# Patient Record
Sex: Male | Born: 1950 | Race: White | Hispanic: No | Marital: Married | Smoking: Never smoker
Health system: Southern US, Community
[De-identification: ages and names within clinical notes are randomized; demographics above are authoritative.]

## PROBLEM LIST (undated history)

## (undated) DIAGNOSIS — I351 Nonrheumatic aortic (valve) insufficiency: Secondary | ICD-10-CM

## (undated) DIAGNOSIS — J189 Pneumonia, unspecified organism: Secondary | ICD-10-CM

## (undated) DIAGNOSIS — I499 Cardiac arrhythmia, unspecified: Secondary | ICD-10-CM

## (undated) DIAGNOSIS — I77810 Thoracic aortic ectasia: Secondary | ICD-10-CM

## (undated) DIAGNOSIS — I4891 Unspecified atrial fibrillation: Secondary | ICD-10-CM

## (undated) DIAGNOSIS — E785 Hyperlipidemia, unspecified: Secondary | ICD-10-CM

## (undated) HISTORY — DX: Thoracic aortic ectasia: I77.810

## (undated) HISTORY — PX: HERNIA REPAIR: SHX51

## (undated) HISTORY — DX: Unspecified atrial fibrillation: I48.91

## (undated) HISTORY — PX: COLONOSCOPY: SHX174

## (undated) HISTORY — DX: Hyperlipidemia, unspecified: E78.5

## (undated) HISTORY — PX: AORTIC VALVE REPLACEMENT: SHX41

## (undated) HISTORY — DX: Nonrheumatic aortic (valve) insufficiency: I35.1

---

## 1998-09-04 ENCOUNTER — Encounter (HOSPITAL_BASED_OUTPATIENT_CLINIC_OR_DEPARTMENT_OTHER): Payer: Self-pay | Admitting: General Surgery

## 1998-09-06 ENCOUNTER — Ambulatory Visit (HOSPITAL_COMMUNITY): Admission: RE | Admit: 1998-09-06 | Discharge: 1998-09-06 | Payer: Self-pay | Admitting: General Surgery

## 2000-09-27 ENCOUNTER — Emergency Department (HOSPITAL_COMMUNITY): Admission: EM | Admit: 2000-09-27 | Discharge: 2000-09-27 | Payer: Self-pay | Admitting: Occupational Medicine

## 2004-12-07 ENCOUNTER — Encounter (INDEPENDENT_AMBULATORY_CARE_PROVIDER_SITE_OTHER): Payer: Self-pay | Admitting: Specialist

## 2004-12-07 ENCOUNTER — Ambulatory Visit (HOSPITAL_COMMUNITY): Admission: RE | Admit: 2004-12-07 | Discharge: 2004-12-07 | Payer: Self-pay | Admitting: Gastroenterology

## 2005-08-27 ENCOUNTER — Encounter: Admission: RE | Admit: 2005-08-27 | Discharge: 2005-08-27 | Payer: Self-pay | Admitting: Internal Medicine

## 2008-09-30 ENCOUNTER — Encounter: Admission: RE | Admit: 2008-09-30 | Discharge: 2008-09-30 | Payer: Self-pay | Admitting: Internal Medicine

## 2009-08-21 ENCOUNTER — Encounter: Admission: RE | Admit: 2009-08-21 | Discharge: 2009-08-21 | Payer: Self-pay | Admitting: Internal Medicine

## 2011-04-05 NOTE — Op Note (Signed)
NAMEMARKELLE, [name]:  [id number]   MEDICAL RECORD NO.:  [id number]          PATIENT TYPE:  AMB   LOCATION:  ENDO                         FACILITY:  [facility]   PHYSICIAN:  Anselmo Rod, M.D.  DATE OF BIRTH:  06-07-1951   DATE OF PROCEDURE:  12/07/2004  DATE OF DISCHARGE:                                 OPERATIVE REPORT   PROCEDURE PERFORMED:  Colonoscopy with snare polypectomy x1.   ENDOSCOPIST:  Anselmo Rod, M.D.   INSTRUMENT USED:  Olympus videocolonoscope.   INDICATION FOR THE PROCEDURE:  A 60 year old white male with a family  history of colon cancer, underwent a screening colonoscopy to rule out  colonic polyps, masses, etc.   PREPROCEDURE PREPARATION:  Informed consent was procured from the patient  who was fasted for eight hours prior to the procedure and prepped with a  bottle of magnesium citrate and a gallon of GoLYTELY the night prior to the  procedure.   PREPROCEDURE PHYSICAL:  VITAL SIGNS:  Stable.  NECK:  Supple.  CHEST:  Clear to auscultation.  S1 and S2 regular.  ABDOMEN:  Soft with normal bowel sounds.   DESCRIPTION OF THE PROCEDURE:  The patient was placed in the left lateral  decubitus position and sedated with 50 mg of Demerol and 5 mg of Versed in  slow incremental doses.  Once the patient was adequately sedated and  maintained on low-flow oxygen and continuous cardiac monitoring, the Olympus  videocolonoscope was advanced from the rectum to the cecum, and the patient  had a fairly good prep.  The appendicular orifice and the ileocecal valve  were clearly visualized and photographed.  A small pedunculated polyp was  snared from the hepatic flexure.  Small internal hemorrhoids were seen on  retroflexion.  The rest of the exam was unremarkable.   IMPRESSION:  1.  Small pedunculated polyp snared from the hepatic flexure by snare      polypectomy.  2.  Small internal hemorrhoids seen on retroflexion.  3.  No other masses,  polyps, or diverticula identified.   RECOMMENDATIONS:  1.  Await pathology results.  2.  Avoid nonsteroidals including aspirin for the next four weeks.  3.  Repeat colonoscopy depending on pathology results.  4.  Outpatient followup as need arises in the future.      Jyot   JNM/MEDQ  D:  12/07/2004  T:  12/07/2004  Job:  [id number]   cc:   Olene Craven, M.D.  [address]  [address]  [place]  [place] [address]  Fax: [phone]

## 2015-09-05 DIAGNOSIS — I712 Thoracic aortic aneurysm, without rupture: Secondary | ICD-10-CM

## 2015-09-05 DIAGNOSIS — I7121 Aneurysm of the ascending aorta, without rupture: Secondary | ICD-10-CM

## 2015-09-06 ENCOUNTER — Other Ambulatory Visit: Payer: Self-pay | Admitting: Cardiology

## 2015-09-06 DIAGNOSIS — I712 Thoracic aortic aneurysm, without rupture: Secondary | ICD-10-CM

## 2015-09-06 DIAGNOSIS — I7121 Aneurysm of the ascending aorta, without rupture: Secondary | ICD-10-CM

## 2015-09-11 NOTE — H&P (Signed)
OFFICE VISIT NOTES COPIED TO EPIC FOR DOCUMENTATION   Chad Lawrence 08/28/2015 9:22 AM Location: [facility] Patient #: [phone] DOB: 06-24-1951 Married / Language: Chad Lawrence / Race: White Male   History of Present Illness Chad Lawrence AGNP-C; 08/28/2015 2:55 PM) The patient is a 64 year old male who presents with atrial fibrillation. Patient with no significant prior cardiovascular history, presented on 08/16/2015 with sudden onset of dizziness on waking up. There is no recurrence of symptoms since then. Acute office visit with his PCP revealed presence of atrial fibrillation with controlled ventricular response. He is now referred to me for further evaluation. His last EKG on 10/12/2014 revealed normal sinus rhythm. No other complaints, he is presently asymptomatic.  He denies any chest pain, PND, orthopnea, or syncope. He denies any symptoms suggestive of TIA or claudication. He is a nonsmoker. Denies any history of hypertension, diabetes, hyperlipidemia, or thyroid disease. He does report recent onset of daytime somnolence starting Inderal as well as episodes of wheezing at night which he has attributed to seasonal allergies. His wife reports that he snores at night with witnessed apneic episodes.   Problem List/Past Medical Chad Lawrence; 08/28/2015 9:30 AM) Afib (I48.91)  Allergies Chad Lawrence; 08/28/2015 9:30 AM) No Known Drug Allergies10/08/2015  Family History Chad Lawrence; 08/28/2015 2:02 PM) Mother In stable health. 65 yrs old; HTN, no other cardiovascular conditions; no heart attack or stroke; Father Deceased. at 57 yrs emphysema  Social History Chad Lawrence; 08/28/2015 9:32 AM) Current tobacco use Never smoker. Marital status Married. Living Situation Lives with spouse. Number of Children 5.  Past Surgical History Chad Lawrence; 08/28/2015 2:23 PM) Hernia Chad Lawrence  Medication History Chad Lawrence; 08/28/2015  2:25 PM) Aspirin (81MG  Tablet, 1 Oral daily) Active. Eliquis (5MG  Tablet, 1 Oral two times daily) Active. Inderal XL (80MG  Capsule ER 24HR, 1 Oral daily) Active.  Diagnostic Studies History Chad Lawrence; 08/28/2015 9:32 AM) Colonoscopy2013    Review of Systems (Chad Lawrence AGNP-C; 08/28/2015 2:54 PM) General Present- Fatigue. Not Present- Anorexia and Fever. Respiratory Present- Snoring and Wheezing. Not Present- Cough, Decreased Exercise Tolerance and Dyspnea. Cardiovascular Not Present- Chest Pain, Claudications, Edema, Orthopnea, Paroxysmal Nocturnal Dyspnea and Shortness of Breath. Gastrointestinal Not Present- Change in Bowel Habits, Constipation and Nausea. Neurological Present- Dizziness. Not Present- Focal Neurological Symptoms. Endocrine Not Present- Appetite Changes, Cold Intolerance and Heat Intolerance. Hematology Not Present- Anemia, Petechiae and Prolonged Bleeding.  Vitals Chad Lawrence; 08/28/2015 2:04 PM) 08/28/2015 1:59 PM Weight: 185.56 lb Height: 72in Body Surface Area: 2.06 m Body Mass Index: 25.17 kg/m  Pulse: 84 (Irregular)  P.OX: 98% (Room air) BP: 110/62 (Sitting, Left Arm, Standard)       Physical Exam (Chad Lawrence AGNP-C; 08/28/2015 2:53 PM) General Mental Status-Alert. General Appearance-Cooperative, Appears stated age, Not in acute distress. Orientation-Oriented X3. Build & Nutrition-Well nourished and Moderately built.  Head and Neck Thyroid Gland Characteristics - no palpable nodules, no palpable enlargement.  Chest and Lung Exam Palpation Tender - No chest wall tenderness. Auscultation Breath sounds - Clear.  Cardiovascular Inspection Jugular vein - Right - No Distention. Auscultation Heart Sounds - S1 WNL, S2 WNL and No gallop present. Murmurs & Other Heart Sounds: Murmur - Location - Aortic Area. Grade - I/VI.  Abdomen Palpation/Percussion Palpation and Percussion of the abdomen reveal -  Non Tender and No hepatosplenomegaly. Auscultation Auscultation of the abdomen reveals - Bowel sounds normal.  Peripheral Vascular Lower Extremity Inspection - Left - No Pigmentation, No Varicose veins. Right - No Pigmentation,  No Varicose veins. Palpation - Edema - Left - No edema. Right - No edema. Femoral pulse - Left - Normal. Right - Normal. Popliteal pulse - Left - Normal. Right - Normal. Dorsalis pedis pulse - Left - Normal. Right - Normal. Posterior tibial pulse - Left - Normal. Right - Normal. Carotid arteries - Left-No Carotid bruit. Carotid arteries - Right-No Carotid bruit. Abdomen-No prominent abdominal aortic pulsation, No epigastric bruit.  Neurologic Motor-Grossly intact without any focal deficits.  Musculoskeletal Global Assessment Left Lower Extremity - normal range of motion without pain. Right Lower Extremity - normal range of motion without pain.    Assessment & Plan (Chad Lawrence AGNP-C; 08/28/2015 2:53 PM) New onset atrial fibrillation (I48.91) Story: Labs 08/16/2015: Labs 08/16/2015: Creatinine 0.90, potassium 4.1, CMP normal, CBC normal, TSH 2.355 Impression: EKG 08/28/2015: Atrial fibrillation with controlled ventricular response at a rate of 88 bpm, left axis deviation, left anterior fascicular block, poor R-wave progression, cannot exclude anterior infarct old. Current Plans Discontinued Inderal XL 80MG  (SOB). Started Metoprolol Succinate ER 25MG , 1 (one) Tablet daily, #30, 08/28/2015, Ref. x3. Started Eliquis 5MG , 1 Tablet two times daily, #60, 30 days starting 08/28/2015, Ref. x5. Complete electrocardiogram (93000) Future Plans 09/05/2015: Echocardiography, transthoracic, real-time with image documentation (2D), includes M-mode recording, when performed, complete, with spectral Doppler echocardiography, and with color flow Doppler echocardiography (95621) - one time 09/08/2015: Myocardial perfusion imaging, tomographic (SPECT) (including  attenuation correction, qualitative or quantitative wall motion, ejection fraction by first pass or gated technique, additional quantification, when performed) - one time Daytime somnolence (R40.0) Snoring (R06.83) Current Plans Mechanism of underlying disease process and action of medications discussed with the patient. I discussed primary/secondary prevention. He presents for evaluation of recently diagnosed atrial fibrillation. He presented to his PCP with complaints of dizziness and near syncope and A. fib was noted on EKG. He denies any palpitations, chest pain, or shortness of breath. He was started on Inderal and Eliquis by his PCP and referred here for further evaluation. He remains in atrial fibrillation today. He reports fatigue, shortness of breath, and sensation of wheezing at night since starting Inderal. I've discontinued this and changed to metoprolol for cardioselectivity. Schedule for echocardiogram to evaluate for structural or valvular abnormalities and exercise Myoview to evaluate for myocardial ischemia as etiology of atrial fibrillation. Suspect sleep apnea to be etiology of A. fib. Wife present at bedside and reports that the patient snores and that she has witnessed apneic episodes at night. Will refer to Dr. Brett Fairy for further evaluation. He also reports taking Claritin-D daily for the past several weeks due to allergies. He was advised to avoid pseudophedrine containing decongestants. Follow up after testing at which time we will discuss cardioversion if he remains in A. fib.  *I have discussed this case with Dr. Einar Gip and he personally examined the patient and participated in formulating the plan.*    Signed by Chad Lawrence, AGNP-C (08/28/2015 2:56 PM)  Addnedum: Outpatient echo revealing severe ascending aortic aneurysm measuring 6cm. Hence, stress cancelled and patient set up for left heart cath in preparation for aortic root repair.  Echocardiogram 09/05/2015: Left  ventricle cavity is normal in size. Mild to moderate concentric hypertrophy of the left ventricle. Normal global wall motion. Calculated EF 65%. Aortic valve is tricuspid. Moderate to severe aortic regurgitation. 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 Severely dilated ascending aorta.  Carotid artery duplex 09/07/2015: No hemodynamically significant arterial disease in the internal carotid artery bilaterally.  Patient has also  been set up for CT angio chest and abdomen also for sizing.  .I have personally reviewed the patient's record and performed physical exam and agree with the assessment and plan of Ms. Chad Labella, NP-C.  Adrian Prows, MD 09/11/2015, 6:43 PM [facility]. [place] Pager: [phone] Office: [phone] If no answer: Cell:  [phone]

## 2015-09-12 ENCOUNTER — Encounter (HOSPITAL_COMMUNITY)
Admission: RE | Disposition: A | Discharge status: Home / Self Care | Payer: Self-pay | Source: Ambulatory Visit | Attending: Cardiology

## 2015-09-12 ENCOUNTER — Ambulatory Visit (HOSPITAL_COMMUNITY)
Admission: RE | Admit: 2015-09-12 | Discharge: 2015-09-12 | Disposition: A | Discharge status: Home / Self Care | Payer: 59 | Source: Ambulatory Visit | Attending: Cardiology | Admitting: Cardiology

## 2015-09-12 DIAGNOSIS — I712 Thoracic aortic aneurysm, without rupture: Secondary | ICD-10-CM | POA: Diagnosis present

## 2015-09-12 DIAGNOSIS — I7121 Aneurysm of the ascending aorta, without rupture: Secondary | ICD-10-CM

## 2015-09-12 DIAGNOSIS — I4891 Unspecified atrial fibrillation: Secondary | ICD-10-CM | POA: Insufficient documentation

## 2015-09-12 DIAGNOSIS — I2510 Atherosclerotic heart disease of native coronary artery without angina pectoris: Secondary | ICD-10-CM | POA: Diagnosis not present

## 2015-09-12 HISTORY — PX: CARDIAC CATHETERIZATION: SHX172

## 2015-09-12 SURGERY — LEFT HEART CATH AND CORONARY ANGIOGRAPHY
Anesthesia: LOCAL

## 2015-09-12 MED ORDER — HYDROMORPHONE HCL 1 MG/ML IJ SOLN
INTRAMUSCULAR | Status: AC
  Filled 2015-09-12: qty 1

## 2015-09-12 MED ORDER — SODIUM CHLORIDE 0.9 % IV SOLN: 250.0000 mL | INTRAVENOUS | Status: DC | PRN

## 2015-09-12 MED ORDER — SODIUM CHLORIDE 0.9 % IV SOLN
INTRAVENOUS | Status: DC
  Administered 2015-09-12: 10:00:00 via INTRAVENOUS

## 2015-09-12 MED ORDER — ASPIRIN 81 MG PO CHEW: 81.0000 mg | CHEWABLE_TABLET | ORAL | Status: DC

## 2015-09-12 MED ORDER — NITROGLYCERIN 1 MG/10 ML FOR IR/CATH LAB
INTRA_ARTERIAL | Status: AC
  Filled 2015-09-12: qty 10

## 2015-09-12 MED ORDER — LIDOCAINE HCL (PF) 1 % IJ SOLN
INTRAMUSCULAR | Status: AC
  Filled 2015-09-12: qty 30

## 2015-09-12 MED ORDER — HYDROMORPHONE HCL 1 MG/ML IJ SOLN
INTRAMUSCULAR | Status: DC | PRN
  Administered 2015-09-12: 0.5 mg via INTRAVENOUS

## 2015-09-12 MED ORDER — HEPARIN SODIUM (PORCINE) 1000 UNIT/ML IJ SOLN
INTRAMUSCULAR | Status: AC
  Filled 2015-09-12: qty 1

## 2015-09-12 MED ORDER — MIDAZOLAM HCL 2 MG/2ML IJ SOLN
INTRAMUSCULAR | Status: AC
  Filled 2015-09-12: qty 4

## 2015-09-12 MED ORDER — IOHEXOL 350 MG/ML SOLN
INTRAVENOUS | Status: DC | PRN
  Administered 2015-09-12: 50 mL via INTRA_ARTERIAL

## 2015-09-12 MED ORDER — HEPARIN (PORCINE) IN NACL 2-0.9 UNIT/ML-% IJ SOLN
INTRAMUSCULAR | Status: AC
  Filled 2015-09-12: qty 1000

## 2015-09-12 MED ORDER — HEPARIN SODIUM (PORCINE) 1000 UNIT/ML IJ SOLN
INTRAMUSCULAR | Status: DC | PRN
  Administered 2015-09-12: 2500 [IU] via INTRAVENOUS

## 2015-09-12 MED ORDER — VERAPAMIL HCL 2.5 MG/ML IV SOLN
INTRA_ARTERIAL | Status: DC | PRN
  Administered 2015-09-12: 7 mL via INTRA_ARTERIAL

## 2015-09-12 MED ORDER — SODIUM CHLORIDE 0.9 % IJ SOLN: 3.0000 mL | Freq: Two times a day (BID) | INTRAMUSCULAR | Status: DC

## 2015-09-12 MED ORDER — MIDAZOLAM HCL 2 MG/2ML IJ SOLN
INTRAMUSCULAR | Status: DC | PRN
  Administered 2015-09-12: 2 mg via INTRAVENOUS

## 2015-09-12 MED ORDER — LIDOCAINE HCL (PF) 1 % IJ SOLN
INTRAMUSCULAR | Status: DC | PRN
  Administered 2015-09-12: 5 mL

## 2015-09-12 MED ORDER — SODIUM CHLORIDE 0.9 % IJ SOLN: 3.0000 mL | INTRAMUSCULAR | Status: DC | PRN

## 2015-09-12 MED ORDER — SODIUM CHLORIDE 0.9 % WEIGHT BASED INFUSION: 3.0000 mL/kg/h | INTRAVENOUS | Status: DC

## 2015-09-12 SURGICAL SUPPLY — 10 items
CATH INFINITI 5 FR AL2 (CATHETERS) ×2 IMPLANT
CATH OPTITORQUE TIG 4.5 5F (CATHETERS) ×2 IMPLANT
DEVICE RAD COMP TR BAND LRG (VASCULAR PRODUCTS) ×2 IMPLANT
GLIDESHEATH SLEND A-KIT 6F 20G (SHEATH) ×2 IMPLANT
GUIDEWIRE ANGLED .035X150CM (WIRE) ×2 IMPLANT
KIT HEART LEFT (KITS) ×2 IMPLANT
PACK CARDIAC CATHETERIZATION (CUSTOM PROCEDURE TRAY) ×2 IMPLANT
TRANSDUCER W/STOPCOCK (MISCELLANEOUS) ×2 IMPLANT
TUBING CIL FLEX 10 FLL-RA (TUBING) ×2 IMPLANT
WIRE SAFE-T 1.5MM-J .035X260CM (WIRE) ×2 IMPLANT

## 2015-09-12 NOTE — Interval H&P Note (Signed)
History and Physical Interval Note:  09/12/2015 10:50 AM  Chad Lawrence  has presented today for surgery, with the diagnosis of cp    a- fib  The various methods of treatment have been discussed with the patient and family. After consideration of risks, benefits and other options for treatment, the patient has consented to  Procedure(s): Left Heart Cath and Coronary Angiography (N/A) as a surgical intervention .  The patient's history has been reviewed, patient examined, no change in status, stable for surgery.  I have reviewed the patient's chart and labs.  Questions were answered to the patient's satisfaction.     Adrian Prows

## 2015-09-12 NOTE — Discharge Instructions (Signed)
Radial Site Care °Refer to this sheet in the next few weeks. These instructions provide you with information about caring for yourself after your procedure. Your health care provider may also give you more specific instructions. Your treatment has been planned according to current medical practices, but problems sometimes occur. Call your health care provider if you have any problems or questions after your procedure. °WHAT TO EXPECT AFTER THE PROCEDURE °After your procedure, it is typical to have the following: °· Bruising at the radial site that usually fades within 1-2 weeks. °· Blood collecting in the tissue (hematoma) that may be painful to the touch. It should usually decrease in size and tenderness within 1-2 weeks. °HOME CARE INSTRUCTIONS °· Take medicines only as directed by your health care provider. °· You may shower 24-48 hours after the procedure or as directed by your health care provider. Remove the bandage (dressing) and gently wash the site with plain soap and water. Pat the area dry with a clean towel. Do not rub the site, because this may cause bleeding. °· Do not take baths, swim, or use a hot tub until your health care provider approves. °· Check your insertion site every day for redness, swelling, or drainage. °· Do not apply powder or lotion to the site. °· Do not flex or bend the affected arm for 24 hours or as directed by your health care provider. °· Do not push or pull heavy objects with the affected arm for 24 hours or as directed by your health care provider. °· Do not lift over 10 lb (4.5 kg) for 5 days after your procedure or as directed by your health care provider. °· Ask your health care provider when it is okay to: °¨ Return to work or school. °¨ Resume usual physical activities or sports. °¨ Resume sexual activity. °· Do not drive home if you are discharged the same day as the procedure. Have someone else drive you. °· You may drive 24 hours after the procedure unless otherwise  instructed by your health care provider. °· Do not operate machinery or power tools for 24 hours after the procedure. °· If your procedure was done as an outpatient procedure, which means that you went home the same day as your procedure, a responsible adult should be with you for the first 24 hours after you arrive home. °· Keep all follow-up visits as directed by your health care provider. This is important. °SEEK MEDICAL CARE IF: °· You have a fever. °· You have chills. °· You have increased bleeding from the radial site. Hold pressure on the site and call 911. °SEEK IMMEDIATE MEDICAL CARE IF: °· You have unusual pain at the radial site. °· You have redness, warmth, or swelling at the radial site. °· You have drainage (other than a small amount of blood on the dressing) from the radial site. °· The radial site is bleeding, and the bleeding does not stop after 30 minutes of holding steady pressure on the site. °· Your arm or hand becomes pale, cool, tingly, or numb. °  °This information is not intended to replace advice given to you by your health care provider. Make sure you discuss any questions you have with your health care provider. °  °Document Released: 12/07/2010 Document Revised: 11/25/2014 Document Reviewed: 05/23/2014 °Elsevier Interactive Patient Education ©2016 [employer] ° °

## 2015-09-13 ENCOUNTER — Encounter (HOSPITAL_COMMUNITY): Payer: Self-pay | Admitting: Cardiology

## 2015-09-14 ENCOUNTER — Ambulatory Visit
Admission: RE | Admit: 2015-09-14 | Discharge: 2015-09-14 | Disposition: A | Discharge status: Home / Self Care | Payer: 59 | Source: Ambulatory Visit | Attending: Cardiology | Admitting: Cardiology

## 2015-09-14 ENCOUNTER — Encounter (INDEPENDENT_AMBULATORY_CARE_PROVIDER_SITE_OTHER): Payer: Self-pay

## 2015-09-14 DIAGNOSIS — I7121 Aneurysm of the ascending aorta, without rupture: Secondary | ICD-10-CM

## 2015-09-14 DIAGNOSIS — I712 Thoracic aortic aneurysm, without rupture: Secondary | ICD-10-CM

## 2015-09-14 MED ORDER — IOPAMIDOL (ISOVUE-370) INJECTION 76%
75.0000 mL | Freq: Once | INTRAVENOUS | Status: AC | PRN
Start: 2015-09-14 — End: 2015-09-14
  Administered 2015-09-14: 75 mL via INTRAVENOUS

## 2015-09-15 ENCOUNTER — Encounter: Payer: Self-pay | Admitting: Cardiothoracic Surgery

## 2015-09-15 ENCOUNTER — Other Ambulatory Visit: Payer: Self-pay | Admitting: *Deleted

## 2015-09-15 ENCOUNTER — Institutional Professional Consult (permissible substitution) (INDEPENDENT_AMBULATORY_CARE_PROVIDER_SITE_OTHER): Payer: 59 | Admitting: Cardiothoracic Surgery

## 2015-09-15 VITALS — BP 123/72 | HR 87 | Resp 16 | Ht 72.0 in | Wt 180.0 lb

## 2015-09-15 DIAGNOSIS — I4819 Other persistent atrial fibrillation: Secondary | ICD-10-CM

## 2015-09-15 DIAGNOSIS — I351 Nonrheumatic aortic (valve) insufficiency: Secondary | ICD-10-CM

## 2015-09-15 DIAGNOSIS — I77810 Thoracic aortic ectasia: Secondary | ICD-10-CM | POA: Diagnosis not present

## 2015-09-15 DIAGNOSIS — I712 Thoracic aortic aneurysm, without rupture, unspecified: Secondary | ICD-10-CM

## 2015-09-15 DIAGNOSIS — I350 Nonrheumatic aortic (valve) stenosis: Secondary | ICD-10-CM

## 2015-09-15 DIAGNOSIS — I481 Persistent atrial fibrillation: Secondary | ICD-10-CM | POA: Diagnosis not present

## 2015-09-15 NOTE — Progress Notes (Signed)
[name] [address]             [phone]                    Chad Lawrence [place] Medical Record #[id number] Date of Birth: Mar 25, 1951  Referring: Adrian Prows, MD Primary Care: [name], MD  Chief Complaint:    Chief Complaint  Patient presents with  . Aortic Insuffiency    with AORTIC REGURGITATION, DIALATED AORTIC ROOT and AF...ECHO, 10/18, CATH 10/25, CTA'S 09/14/15    History of Present Illness:    Chad Lawrence 64 y.o. male is seen in the office  today for new diagnosis of AI, dilated aortic root and Afib of unknown duration. For several months especially during the summer patient had several episodes of light headed feeling but without syncope. Events noted with exertion, coaching baseball in heat, playing golf amd recently climbing stairs.  He has no previous cardiac history. He had a regular appointment with primary care and was found to be in afib. Eliquis was started  and patient referred to Dr Einar Gip.   Family history is significant that there is no family history of dilated ascending aorta or dissection, but father  died  70 with ruptured abdominal aneurysm. An uncle ( father brother) died of ?mi in his 29's without known details.  5 children, 3 step childrenand two adopted.    Current Activity/ Functional Status:  Patient is independent with mobility/ambulation, transfers, ADL's, IADL's.   Zubrod Score: At the time of surgery this patient's most appropriate activity status/level should be described as: [x]     0    Normal activity, no symptoms []     1    Restricted in physical strenuous activity but ambulatory, able to do out light work []     2    Ambulatory and capable of self care, unable to do work activities, up and about               >50 % of waking hours                              []     3    Only limited self care, in bed greater than 50% of waking hours []     4    Completely disabled, no self care,  confined to bed or chair []     5    Moribund   Past Medical History  Diagnosis Date  . Atrial fibrillation ([place])   . Aortic regurgitation   . Ascending aorta dilatation [facility])     Past Surgical History  Procedure Laterality Date  . Cardiac catheterization N/A 09/12/2015    Procedure: Left Heart Cath and Coronary Angiography;  Surgeon: Adrian Prows, MD;  Location: [facility];  Service: Cardiovascular;  Laterality: N/A;  . Hernia repair      Family History  Problem Relation Age of Onset  . Hypertension Mother   . Emphysema Father     Social History   Social History  . Marital Status: Married    Spouse Name: N/A  . Number of Children: 5  . Years of Education: N/A   Occupational History  . Not on file.   Social History Main Topics  . Smoking status: Never Smoker   . Smokeless tobacco: Never Used  . Alcohol Use: No  .  Drug Use: No  . Sexual Activity: Not on file          Social History Narrative  Works at [employer] for [employer], work involves traveling   History  Smoking status  . Never Smoker   Smokeless tobacco  . Never Used    History  Alcohol Use No     No Known Allergies  Current Outpatient Prescriptions  Medication Sig Dispense Refill  . aspirin EC 81 MG tablet Take 81 mg by mouth daily.    . clotrimazole-betamethasone (LOTRISONE) cream Apply 1 application topically daily as needed (rash).   1  . metoprolol succinate (TOPROL-XL) 25 MG 24 hr tablet Take 25 mg by mouth daily.  3  . apixaban (ELIQUIS) 5 MG TABS tablet Take 5 mg by mouth 2 (two) times daily.     No current facility-administered medications for this visit.      Review of Systems:     Cardiac Review of Systems: Y or N  Chest Pain [  n  ]  Resting SOB [n   ] Exertional SOB  [  n]  Orthopnea Florencio.Farrier  ]   Pedal Edema [   ]    Palpitations [ nn ] Syncope  [ n ]   Presyncope [ y  ]  General Review of Systems: [Y] = yes [  ]=no Constitional: recent weight change [ n ];  Wt  loss over the last 3 months [   ] anorexia [  ]; fatigue [  ]; nausea [  ]; night sweats [  ]; fever [  ]; or chills [  ];          Dental: poor dentition[n  ]; Last Dentist visit:regular visits   Eye : blurred vision [  ]; diplopia [   ]; vision changes [  ];  Amaurosis fugax[  ]; Resp: cough [  ];  wheezing[  ];  hemoptysis[  ]; shortness of breath[  ]; paroxysmal nocturnal dyspnea[  ]; dyspnea on exertion[  ]; or orthopnea[  ];  GI:  gallstones[  ], vomiting[  ];  dysphagia[  ]; melena[  ];  hematochezia [  ]; heartburn[  ];   Hx of  Colonoscopy[yes  5 years ago  ]; GU: kidney stones [  ]; hematuria[n  ];   dysuria [  ];  nocturia[  ];  history of     obstruction [  ]; urinary frequency [n  ]             Skin: rash, swelling[  ];, hair loss[  ];  peripheral edema[  ];  or itching[  ]; Musculosketetal: myalgias[  ];  joint swelling[  ];  joint erythema[  ];  joint pain[  ];  back pain[  ];  Heme/Lymph: bruising[  ];  bleeding[  ];  anemia[  ];  Neuro: TIA[  ];  headaches[  ];  stroke[  ];  vertigo[  ];  seizures[  ];   paresthesias[  ];  difficulty walking[n  ];  Psych:depression[  ]; anxiety[  ];  Endocrine: diabetes[n  ];  thyroid dysfunction[nn  ];  Immunizations: Flu up to date [  y]; Pneumococcal up to date [n  ];  Other:  Physical Exam: BP 123/72 mmHg  Pulse 87  Resp 16  Ht 6' (1.829 m)  Wt 180 lb (81.647 kg)  BMI 24.41 kg/m2  SpO2 98%  PHYSICAL EXAMINATION: General appearance: alert, cooperative, appears stated age, no  distress and no marfan features Head: Normocephalic, without obvious abnormality, atraumatic Neck: no adenopathy, no carotid bruit, no JVD, supple, symmetrical, trachea midline and thyroid not enlarged, symmetric, no tenderness/mass/nodules Lymph nodes: Cervical, supraclavicular, and axillary nodes normal. Resp: clear to auscultation bilaterally Back: symmetric, no curvature. ROM normal. No CVA tenderness. Cardio: irregularly irregular rhythm and diastolic  murmur: late diastolic 3/6, decrescendo at 2nd right intercostal space GI: soft, non-tender; bowel sounds normal; no masses,  no organomegaly Extremities: extremities normal, atraumatic, no cyanosis or edema Neurologic: Grossly normal  Diagnostic Studies & Laboratory data:     Recent Radiology Findings:  CATH: Dominance: Co-dominant   Left Anterior Descending   . Ost LAD to Prox LAD lesion, 0% stenosed. Proximal LAD diffuse ectasia/minimally aneurysmal     Left Circumflex   . Mid Cx to Dist Cx lesion, 0% stenosed. Mid segment Ectatic/mildly aneurysmal     Right Coronary Artery   . Mid RCA lesion, 30% stenosed. diffuse      no root or lv ejection, aorta appears dilated but con not bee evaluated by cath  I have independently reviewed the above  cath films and reviewed the findings with the  patient .    Ct Angio Chest Aorta W/cm &/or Wo/cm  09/14/2015  CLINICAL DATA:  Followup for thoracic aortic aneurysm. Patient recently had a heart catheterization. EXAM: CT ANGIOGRAPHY CHEST WITH CONTRAST TECHNIQUE: Multidetector CT imaging of the chest was performed using the standard protocol during bolus administration of intravenous contrast. Multiplanar CT image reconstructions and MIPs were obtained to evaluate the vascular anatomy. CONTRAST:  75 mL of Isovue 370 intravenous contrast COMPARISON:  Chest radiograph, 08/27/2005 FINDINGS: Angiographic study: The ascending aorta is dilated. The was measured just above the origin of the left coronary artery right measures 6.6 x 6.3 cm. Aorta is dilated beginning just distal to the aortic valve. Becomes normal in caliber as it extends into the aortic arch. Descending thoracic aorta is normal in caliber. There is no dissection. There is no significant plaque. Aortic arch branch vessels are widely patent. The visualized upper abdominal aorta shows mild partly calcified plaque. The branch vessels are widely patent. Thoracic inlet: Heterogeneous right  thyroid lobe nodule measuring 3.9 x 3.1 x 3.7 cm. Sub cm nodules seen above this in the posterior upper pole of the right lobe. No neck base masses or adenopathy. Mediastinum and hila: Heart is normal in size. No coronary artery calcifications. Oval low-density homogeneous mass lies along the left pleural margin of the mediastinum adjacent to the main pulmonary artery. It measures 2.2 x 1.6 x 2.2 cm with Hounsfield units of 0 assistant with water. This has the appearance of the cysts. It may be a small pericardial or bronchogenic cyst. Is unlikely to be significant. No other mediastinal an no hilar masses or pathologically enlarged lymph nodes. 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 Mild areas posterior lower lobe reticular opacity likely due to scarring, subsegmental atelectasis or combination. There is no evidence of pneumonia or pulmonary edema. There is no lung mass or suspicious nodule. No pleural effusion or pneumothorax. Small area of scarring or atelectasis at the posterior right lung base is associated with focus of pleural thickening and calcification. Limited upper abdomen: Small low-density lesions noted in the liver, nonspecific but likely cysts. Otherwise unremarkable. Musculoskeletal: Degenerative changes of the thoracic spine. Mild levoscoliosis. No osteoblastic or osteolytic lesions. Review of the MIP images confirms the above findings. IMPRESSION: 1. 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 It was measured just above the left coronary artery with maximum transverse dimensions of 6.6 x 6.3 cm. No aortic dissection. No significant plaque. 2. No acute findings in the chest. 3. 2.2 cm cystic lesion along the left mediastinum likely a pericardial or bronchogenic cyst. This is not felt to need further evaluation. 4. 3.9 cm heterogeneous right thyroid nodule. Recommend follow-up ultrasound and possible biopsy if this has  not been previously performed. Electronically Signed   By: Lajean Manes M.D.   On: 09/14/2015 11:45   I have independently reviewed the above radiology studies  and reviewed the findings with the patient.    I have independently reviewed the above radiologic studies.  ECHO: done in Dr Einar Gip office: Report: Nl  global lv function 65 % ef Moderate to severe AI, tricuspid valve  lvDd 4.7 cm  Aortic Size Index=    6.6     /Body surface area is 2.04 meters squared. 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 If aortic valve replacement needed I discussed with the patient tissue vs mechanical valve use. Pro and cons of each were discussed. Patient prefers tissue valve rather mechanical. Risks of surgery discussed. Patient agreeable, now off Eliquis. Will have pre op labs done Monday oct 31. Plan surgery Nov2.   I  spent 40 minutes counseling the patient face to face and 50% or more the  time was spent in counseling and coordination of care. The total time spent in the appointment was 60 minutes.  [name] MD      [address] [place] [address] Office [phone]   [place]eeper [phone]  09/15/2015 1:27 PM

## 2015-09-18 ENCOUNTER — Encounter (HOSPITAL_COMMUNITY): Payer: Self-pay

## 2015-09-18 ENCOUNTER — Other Ambulatory Visit: Payer: Self-pay | Admitting: *Deleted

## 2015-09-18 ENCOUNTER — Ambulatory Visit (HOSPITAL_COMMUNITY)
Admission: RE | Admit: 2015-09-18 | Discharge: 2015-09-18 | Disposition: A | Discharge status: Home / Self Care | Payer: 59 | Source: Ambulatory Visit | Attending: Cardiothoracic Surgery | Admitting: Cardiothoracic Surgery

## 2015-09-18 ENCOUNTER — Ambulatory Visit (HOSPITAL_BASED_OUTPATIENT_CLINIC_OR_DEPARTMENT_OTHER)
Admission: RE | Admit: 2015-09-18 | Discharge: 2015-09-18 | Disposition: A | Discharge status: Home / Self Care | Payer: 59 | Source: Ambulatory Visit | Attending: Cardiothoracic Surgery | Admitting: Cardiothoracic Surgery

## 2015-09-18 ENCOUNTER — Ambulatory Visit
Admission: RE | Admit: 2015-09-18 | Discharge: 2015-09-18 | Disposition: A | Discharge status: Home / Self Care | Payer: 59 | Source: Ambulatory Visit | Attending: Cardiothoracic Surgery | Admitting: Cardiothoracic Surgery

## 2015-09-18 ENCOUNTER — Other Ambulatory Visit (HOSPITAL_COMMUNITY): Payer: Self-pay | Admitting: *Deleted

## 2015-09-18 ENCOUNTER — Encounter (HOSPITAL_COMMUNITY)
Admission: RE | Admit: 2015-09-18 | Discharge: 2015-09-18 | Disposition: A | Discharge status: Home / Self Care | Payer: 59 | Source: Ambulatory Visit | Attending: Cardiothoracic Surgery | Admitting: Cardiothoracic Surgery

## 2015-09-18 VITALS — BP 119/55 | HR 106 | Temp 98.1°F | Resp 18 | Ht 72.0 in | Wt 182.4 lb

## 2015-09-18 DIAGNOSIS — I351 Nonrheumatic aortic (valve) insufficiency: Secondary | ICD-10-CM

## 2015-09-18 DIAGNOSIS — I712 Thoracic aortic aneurysm, without rupture, unspecified: Secondary | ICD-10-CM

## 2015-09-18 DIAGNOSIS — E079 Disorder of thyroid, unspecified: Secondary | ICD-10-CM

## 2015-09-18 HISTORY — DX: Pneumonia, unspecified organism: J18.9

## 2015-09-18 LAB — PROTIME-INR
INR: 1.22 (ref 0.00–1.49)
Prothrombin Time: 15.5 seconds — ABNORMAL HIGH (ref 11.6–15.2)

## 2015-09-18 LAB — PULMONARY FUNCTION TEST
DL/VA % pred: 75 %
DL/VA: 3.56 ml/min/mmHg/L
DLCO unc % pred: 78 %
DLCO unc: 27.6 ml/min/mmHg
FEF 25-75 Post: 3.42 L/sec
FEF 25-75 Pre: 3.09 L/sec
FEF2575-%Change-Post: 10 %
FEF2575-%Pred-Post: 119 %
FEF2575-%Pred-Pre: 107 %
FEV1-%Change-Post: 4 %
FEV1-%Pred-Post: 110 %
FEV1-%Pred-Pre: 105 %
FEV1-Post: 4.03 L
FEV1-Pre: 3.87 L
FEV1FVC-%Change-Post: 3 %
FEV1FVC-%Pred-Pre: 100 %
FEV6-%Change-Post: 1 %
FEV6-%Pred-Post: 112 %
FEV6-%Pred-Pre: 110 %
FEV6-Post: 5.24 L
FEV6-Pre: 5.18 L
FEV6FVC-%Change-Post: 0 %
FEV6FVC-%Pred-Post: 105 %
FEV6FVC-%Pred-Pre: 105 %
FVC-%Change-Post: 1 %
FVC-%Pred-Post: 106 %
FVC-%Pred-Pre: 105 %
FVC-Post: 5.24 L
FVC-Pre: 5.19 L
Post FEV1/FVC ratio: 77 %
Post FEV6/FVC ratio: 100 %
Pre FEV1/FVC ratio: 74 %
Pre FEV6/FVC Ratio: 100 %
RV % pred: 141 %
RV: 3.5 L
TLC % pred: 117 %
TLC: 8.72 L

## 2015-09-18 LAB — BLOOD GAS, ARTERIAL
Acid-base deficit: 1.3 mmol/L (ref 0.0–2.0)
Bicarbonate: 22 mEq/L (ref 20.0–24.0)
Drawn by: 421801
FIO2: 0.21
O2 Saturation: 98.7 %
Patient temperature: 98.6
TCO2: 23 mmol/L (ref 0–100)
pCO2 arterial: 31.5 mmHg — ABNORMAL LOW (ref 35.0–45.0)
pH, Arterial: 7.459 — ABNORMAL HIGH (ref 7.350–7.450)
pO2, Arterial: 121 mmHg — ABNORMAL HIGH (ref 80.0–100.0)

## 2015-09-18 LAB — COMPREHENSIVE METABOLIC PANEL
ALT: 26 U/L (ref 17–63)
AST: 21 U/L (ref 15–41)
Albumin: 4 g/dL (ref 3.5–5.0)
Alkaline Phosphatase: 68 U/L (ref 38–126)
Anion gap: 9 (ref 5–15)
BUN: 13 mg/dL (ref 6–20)
CO2: 22 mmol/L (ref 22–32)
Calcium: 9.5 mg/dL (ref 8.9–10.3)
Chloride: 107 mmol/L (ref 101–111)
Creatinine, Ser: 0.94 mg/dL (ref 0.61–1.24)
GFR calc Af Amer: >60 mL/min (ref >60)
GFR calc non Af Amer: >60 mL/min (ref >60)
Glucose, Bld: 79 mg/dL (ref 65–99)
Potassium: 4.4 mmol/L (ref 3.5–5.1)
Sodium: 138 mmol/L (ref 135–145)
Total Bilirubin: 1 mg/dL (ref 0.3–1.2)
Total Protein: 7 g/dL (ref 6.5–8.1)

## 2015-09-18 LAB — APTT: aPTT: 30 seconds (ref 24–37)

## 2015-09-18 LAB — SURGICAL PCR SCREEN
MRSA, PCR: NEGATIVE
Staphylococcus aureus: POSITIVE — AB

## 2015-09-18 LAB — ABO/RH: ABO/RH(D): O POS

## 2015-09-18 LAB — URINALYSIS, ROUTINE W REFLEX MICROSCOPIC
Bilirubin Urine: NEGATIVE
Glucose, UA: NEGATIVE mg/dL
Hgb urine dipstick: NEGATIVE
Ketones, ur: NEGATIVE mg/dL
Leukocytes, UA: NEGATIVE
Nitrite: NEGATIVE
Protein, ur: NEGATIVE mg/dL
Specific Gravity, Urine: 1.02 (ref 1.005–1.030)
Urobilinogen, UA: 0.2 mg/dL (ref 0.0–1.0)
pH: 6 (ref 5.0–8.0)

## 2015-09-18 LAB — TYPE AND SCREEN
ABO/RH(D): O POS
Antibody Screen: NEGATIVE

## 2015-09-18 LAB — CBC
HCT: 46.4 % (ref 39.0–52.0)
Hemoglobin: 15.9 g/dL (ref 13.0–17.0)
MCH: 31.9 pg (ref 26.0–34.0)
MCHC: 34.3 g/dL (ref 30.0–36.0)
MCV: 93 fL (ref 78.0–100.0)
Platelets: 241 10*3/uL (ref 150–400)
RBC: 4.99 MIL/uL (ref 4.22–5.81)
RDW: 13.2 % (ref 11.5–15.5)
WBC: 6.2 10*3/uL (ref 4.0–10.5)

## 2015-09-18 MED ORDER — ALBUTEROL SULFATE (2.5 MG/3ML) 0.083% IN NEBU
2.5000 mg | INHALATION_SOLUTION | Freq: Once | RESPIRATORY_TRACT | Status: AC
  Administered 2015-09-18: 2.5 mg via RESPIRATORY_TRACT

## 2015-09-18 NOTE — Progress Notes (Signed)
Mupirocin Ointment Rx called into [facility] on [address] for positive PCR of Staph. Called pt and spoke with his wife, Marion Downer and gave her results. She voiced understanding.

## 2015-09-18 NOTE — Progress Notes (Signed)
Recent diagnosis of Atrial fib, was put on Eliquis. States he was only on it a week before they told him to hold it for the procedures. Denies chest pain or sob.  Pt's PCP is Dr. Mellody Drown  Cardiologist is Dr. Einar Gip

## 2015-09-18 NOTE — Progress Notes (Signed)
VASCULAR LAB PRELIMINARY  PRELIMINARY  PRELIMINARY  PRELIMINARY  Pre-op Cardiac Surgery   Upper Extremity Right Left  Brachial Pressures 129 139  Radial Waveforms Normal Normal  Ulnar Waveforms Normal Normal  Palmar Arch (Allen's Test) Abnormal Abnormal   Findings:   Doppler waveforms remain within normal limits with both right and left radial arteries compression and obliterate with both ulnar arteries compression.    Alla German, RVT 09/18/2015, 3:37 PM

## 2015-09-18 NOTE — Pre-Procedure Instructions (Signed)
Chad Lawrence  09/18/2015     Your procedure is scheduled on Wednesday, September 20, 2015 at 8:30 AM.   Report to [facility] Entrance "A" Admitting Office at 6:30 AM.   Call this number if you have problems the morning of surgery: [phone]     Remember:  Do not eat food or drink liquids after midnight Tuesday, 09/19/15.  Take these medicines the morning of surgery with A SIP OF WATER: Metoprolol (Toprol XL)   Do not wear jewelry.  Do not wear lotions, powders, or cologne.  You may NOT wear deodorant.  Men may shave face and neck.  Do not bring valuables to the hospital.  [facility] is not responsible for any belongings or valuables.  Contacts, dentures or bridgework may not be worn into surgery.  Leave your suitcase in the car.  After surgery it may be brought to your room.  For patients admitted to the hospital, discharge time will be determined by your treatment team.  Special instructions:  [facility] - Preparing for Surgery  Before surgery, you can play an important role.  Because skin is not sterile, your skin needs to be as free of germs as possible.  You can reduce the number of germs on you skin by washing with CHG (chlorahexidine gluconate) soap before surgery.  CHG is an antiseptic cleaner which kills germs and bonds with the skin to continue killing germs even after washing.  Please DO NOT use if you have an allergy to CHG or antibacterial soaps.  If your skin becomes reddened/irritated stop using the CHG and inform your nurse when you arrive at Short Stay.  Do not shave (including legs and underarms) for at least 48 hours prior to the first CHG shower.  You may shave your face.  Please follow these instructions carefully:   1.  Shower with CHG Soap the night before surgery and the                                morning of Surgery.  2.  If you choose to wash your hair, wash your hair first as usual with your       normal shampoo.  3.  After you  shampoo, rinse your hair and body thoroughly to remove the                      Shampoo.  4.  Use CHG as you would any other liquid soap.  You can apply chg directly       to the skin and wash gently with scrungie or a clean washcloth.  5.  Apply the CHG Soap to your body ONLY FROM THE NECK DOWN.        Do not use on open wounds or open sores.  Avoid contact with your eyes, ears, mouth and genitals (private parts).  Wash genitals (private parts) with your normal soap.  6.  Wash thoroughly, paying special attention to the area where your surgery        will be performed.  7.  Thoroughly rinse your body with warm water from the neck down.  8.  DO NOT shower/wash with your normal soap after using and rinsing off       the CHG Soap.  9.  Pat yourself dry with a clean towel.  10.  Wear clean pajamas.            11.  Place clean sheets on your bed the night of your first shower and do not        sleep with pets.  Day of Surgery  Do not apply any lotions/deodorants the morning of surgery.  Please wear clean clothes to the hospital.   Please read over the following fact sheets that you were given. Pain Booklet, Coughing and Deep Breathing, Blood Transfusion Information, MRSA Information and Surgical Site Infection Prevention

## 2015-09-19 LAB — HEMOGLOBIN A1C
Hgb A1c MFr Bld: 5.8 % — ABNORMAL HIGH (ref 4.8–5.6)
Mean Plasma Glucose: 120 mg/dL

## 2015-09-19 MED ORDER — CEFUROXIME SODIUM 1.5 G IJ SOLR
1.5000 g | INTRAMUSCULAR | Status: AC
  Administered 2015-09-20: 1.5 g via INTRAVENOUS
  Administered 2015-09-20: .75 g via INTRAVENOUS
  Filled 2015-09-19 (×2): qty 1.5

## 2015-09-19 MED ORDER — SODIUM CHLORIDE 0.9 % IV SOLN
INTRAVENOUS | Status: AC
  Administered 2015-09-20: .9 [IU]/h via INTRAVENOUS
  Filled 2015-09-19: qty 2.5

## 2015-09-19 MED ORDER — CHLORHEXIDINE GLUCONATE 0.12 % MT SOLN
15.0000 mL | Freq: Once | OROMUCOSAL | Status: AC
  Administered 2015-09-20: 15 mL via OROMUCOSAL
  Filled 2015-09-19 (×2): qty 15

## 2015-09-19 MED ORDER — METOPROLOL TARTRATE 12.5 MG HALF TABLET
12.5000 mg | ORAL_TABLET | Freq: Once | ORAL | Status: AC
  Administered 2015-09-20: 12.5 mg via ORAL
  Filled 2015-09-19: qty 1

## 2015-09-19 MED ORDER — SODIUM CHLORIDE 0.9 % IV SOLN
INTRAVENOUS | Status: AC
  Administered 2015-09-20: 14 mL/h via INTRAVENOUS
  Administered 2015-09-20: 70 mL/h via INTRAVENOUS
  Filled 2015-09-19: qty 40

## 2015-09-19 MED ORDER — DEXTROSE 5 % IV SOLN
750.0000 mg | INTRAVENOUS | Status: DC
  Filled 2015-09-19: qty 750

## 2015-09-19 MED ORDER — VANCOMYCIN HCL 10 G IV SOLR
1250.0000 mg | INTRAVENOUS | Status: AC
  Administered 2015-09-20: 1250 mg via INTRAVENOUS
  Filled 2015-09-19: qty 1250

## 2015-09-19 MED ORDER — PLASMA-LYTE 148 IV SOLN
INTRAVENOUS | Status: AC
  Administered 2015-09-20: 500 mL
  Filled 2015-09-19: qty 2.5

## 2015-09-19 MED ORDER — POTASSIUM CHLORIDE 2 MEQ/ML IV SOLN
80.0000 meq | INTRAVENOUS | Status: DC
  Filled 2015-09-19: qty 40

## 2015-09-19 MED ORDER — MAGNESIUM SULFATE 50 % IJ SOLN
40.0000 meq | INTRAMUSCULAR | Status: DC
  Administered 2015-09-20: 2 g
  Filled 2015-09-19: qty 10

## 2015-09-19 MED ORDER — HEPARIN SODIUM (PORCINE) 1000 UNIT/ML IJ SOLN
INTRAMUSCULAR | Status: DC
  Filled 2015-09-19: qty 30

## 2015-09-19 MED ORDER — CHLORHEXIDINE GLUCONATE 4 % EX LIQD: 30.0000 mL | CUTANEOUS | Status: DC

## 2015-09-19 MED ORDER — DOPAMINE-DEXTROSE 3.2-5 MG/ML-% IV SOLN
0.0000 ug/kg/min | INTRAVENOUS | Status: AC
Start: 2015-09-20 — End: 2015-09-20
  Administered 2015-09-20: 3 ug/kg/min via INTRAVENOUS
  Filled 2015-09-19: qty 250

## 2015-09-19 MED ORDER — PHENYLEPHRINE HCL 10 MG/ML IJ SOLN
30.0000 ug/min | INTRAVENOUS | Status: AC
  Administered 2015-09-20: 10 ug/min via INTRAVENOUS
  Filled 2015-09-19: qty 2

## 2015-09-19 MED ORDER — NITROGLYCERIN IN D5W 200-5 MCG/ML-% IV SOLN
2.0000 ug/min | INTRAVENOUS | Status: AC
  Administered 2015-09-20: 5 ug/min via INTRAVENOUS
  Filled 2015-09-19: qty 250

## 2015-09-19 MED ORDER — EPINEPHRINE HCL 1 MG/ML IJ SOLN
0.0000 ug/min | INTRAVENOUS | Status: DC
  Filled 2015-09-19: qty 4

## 2015-09-19 MED ORDER — DEXMEDETOMIDINE HCL IN NACL 400 MCG/100ML IV SOLN
0.1000 ug/kg/h | INTRAVENOUS | Status: AC
  Administered 2015-09-20: .2 ug/kg/h via INTRAVENOUS
  Filled 2015-09-19: qty 100

## 2015-09-19 NOTE — Progress Notes (Signed)
Anesthesia Chart Review: Patient is a 64 year old male scheduled for [place] Procedure, possible MAZE, possible clipping of atrial appendage on 09/20/15 by Dr. Servando Snare.  History includes non-smoker, afib, 6.6 cm ascending thoracic aortic aneurysm, moderate to severe AR. Recent finding of right thyroid nodule (per Dr. Servando Snare, evaluated by U/S 09/18/15 with recommendation for FNA--defer timing to Dr. Servando Snare). He is on b-blocker therapy. Eliquis on hold since 09/06/15. PCP is Dr. [name]. Cardiologist is Dr. Adrian Prows.  09/18/15 EKG: afib at 95 bpm, LAD inferior infarct (age undetermined), anterior infarct (age undetermined).  09/11/2015 Cardiac cath: Mild diffuse 20-30% at most stenosis in the mid RCA, proximal LAD shows ectasia/mild aneurysmal dilatation. Mid circumflex shows ectasia/mild aneurysm formation. 2 separate ostia for the left anterior descending and circumflex.  09/05/15 Echo: mild to moderate LVH. Normal global wall motion. EF 65%. Moderate to severe AR. Severely dilated aortic root (6cm). Severely dilated ascending aorta.  09/07/15 Carotid duplex [facility]): No hemodynamically significant arterial disease in the ICA bilaterally.   09/14/15 Chest CTA: IMPRESSION: 1. 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 It was measured just above the left coronary artery with maximum transverse dimensions of 6.6 x 6.3 cm. No aortic dissection. No significant plaque. 2. No acute findings in the chest. 3. 2.2 cm cystic lesion along the left mediastinum likely a pericardial or bronchogenic cyst. This is not felt to need further evaluation. 4. 3.9 cm heterogeneous right thyroid nodule. Recommend follow-up ultrasound and possible biopsy if this has not been previously performed.  09/18/15 PFTs: FVC 5.19 (105%), FEV1 3.87 (105%), DLCO unc 78%.  Preoperative CXR and labs noted.  A1C 5.8. UA WNL.  Further evaluation by anesthesia on  the day of surgery.  George Hugh [facility] Short Stay Center/Anesthesiology Phone [phone] 09/19/2015 1:40 PM

## 2015-09-20 ENCOUNTER — Inpatient Hospital Stay (HOSPITAL_COMMUNITY): Payer: 59 | Admitting: Certified Registered"

## 2015-09-20 ENCOUNTER — Encounter (HOSPITAL_COMMUNITY): Payer: Self-pay | Admitting: General Practice

## 2015-09-20 ENCOUNTER — Inpatient Hospital Stay (HOSPITAL_COMMUNITY): Payer: 59

## 2015-09-20 ENCOUNTER — Inpatient Hospital Stay (HOSPITAL_COMMUNITY)
Admission: AD | Admit: 2015-09-20 | Discharge: 2015-09-26 | DRG: 220 | Disposition: A | Discharge status: Home / Self Care | Payer: 59 | Source: Ambulatory Visit | Attending: Cardiothoracic Surgery | Admitting: Cardiothoracic Surgery

## 2015-09-20 ENCOUNTER — Encounter (HOSPITAL_COMMUNITY)
Admission: AD | Disposition: A | Discharge status: Home / Self Care | Payer: 59 | Source: Ambulatory Visit | Attending: Cardiothoracic Surgery

## 2015-09-20 DIAGNOSIS — I10 Essential (primary) hypertension: Secondary | ICD-10-CM | POA: Diagnosis present

## 2015-09-20 DIAGNOSIS — I482 Chronic atrial fibrillation: Secondary | ICD-10-CM | POA: Diagnosis present

## 2015-09-20 DIAGNOSIS — Z09 Encounter for follow-up examination after completed treatment for conditions other than malignant neoplasm: Secondary | ICD-10-CM

## 2015-09-20 DIAGNOSIS — D696 Thrombocytopenia, unspecified: Secondary | ICD-10-CM | POA: Diagnosis not present

## 2015-09-20 DIAGNOSIS — E8770 Fluid overload, unspecified: Secondary | ICD-10-CM | POA: Diagnosis not present

## 2015-09-20 DIAGNOSIS — I712 Thoracic aortic aneurysm, without rupture, unspecified: Secondary | ICD-10-CM

## 2015-09-20 DIAGNOSIS — E041 Nontoxic single thyroid nodule: Secondary | ICD-10-CM | POA: Diagnosis present

## 2015-09-20 DIAGNOSIS — Z8249 Family history of ischemic heart disease and other diseases of the circulatory system: Secondary | ICD-10-CM

## 2015-09-20 DIAGNOSIS — Z952 Presence of prosthetic heart valve: Secondary | ICD-10-CM

## 2015-09-20 DIAGNOSIS — I480 Paroxysmal atrial fibrillation: Secondary | ICD-10-CM | POA: Diagnosis present

## 2015-09-20 DIAGNOSIS — J939 Pneumothorax, unspecified: Secondary | ICD-10-CM

## 2015-09-20 DIAGNOSIS — D62 Acute posthemorrhagic anemia: Secondary | ICD-10-CM | POA: Diagnosis not present

## 2015-09-20 DIAGNOSIS — R001 Bradycardia, unspecified: Secondary | ICD-10-CM | POA: Diagnosis present

## 2015-09-20 DIAGNOSIS — I351 Nonrheumatic aortic (valve) insufficiency: Principal | ICD-10-CM | POA: Diagnosis present

## 2015-09-20 DIAGNOSIS — Z7901 Long term (current) use of anticoagulants: Secondary | ICD-10-CM | POA: Diagnosis not present

## 2015-09-20 DIAGNOSIS — Z95828 Presence of other vascular implants and grafts: Secondary | ICD-10-CM

## 2015-09-20 DIAGNOSIS — K567 Ileus, unspecified: Secondary | ICD-10-CM

## 2015-09-20 DIAGNOSIS — J9811 Atelectasis: Secondary | ICD-10-CM | POA: Diagnosis not present

## 2015-09-20 DIAGNOSIS — I481 Persistent atrial fibrillation: Secondary | ICD-10-CM | POA: Diagnosis not present

## 2015-09-20 DIAGNOSIS — K9189 Other postprocedural complications and disorders of digestive system: Secondary | ICD-10-CM

## 2015-09-20 DIAGNOSIS — Z9689 Presence of other specified functional implants: Secondary | ICD-10-CM

## 2015-09-20 HISTORY — PX: TEE WITHOUT CARDIOVERSION: SHX5443

## 2015-09-20 HISTORY — PX: CLIPPING OF ATRIAL APPENDAGE: SHX5773

## 2015-09-20 HISTORY — PX: BENTALL PROCEDURE: SHX5058

## 2015-09-20 HISTORY — PX: MAZE: SHX5063

## 2015-09-20 LAB — POCT I-STAT 3, ART BLOOD GAS (G3+)
Acid-Base Excess: 2 mmol/L (ref 0.0–2.0)
Acid-base deficit: 2 mmol/L (ref 0.0–2.0)
Acid-base deficit: 5 mmol/L — ABNORMAL HIGH (ref 0.0–2.0)
Acid-base deficit: 3 mmol/L — ABNORMAL HIGH (ref 0.0–2.0)
Acid-base deficit: 5 mmol/L — ABNORMAL HIGH (ref 0.0–2.0)
Bicarbonate: 25.7 mEq/L — ABNORMAL HIGH (ref 20.0–24.0)
Bicarbonate: 27 mEq/L — ABNORMAL HIGH (ref 20.0–24.0)
Bicarbonate: 24.2 mEq/L — ABNORMAL HIGH (ref 20.0–24.0)
Bicarbonate: 20.7 mEq/L (ref 20.0–24.0)
Bicarbonate: 19.5 mEq/L — ABNORMAL LOW (ref 20.0–24.0)
Bicarbonate: 21.3 mEq/L (ref 20.0–24.0)
O2 Saturation: 100 %
O2 Saturation: 100 %
O2 Saturation: 100 %
O2 Saturation: 98 %
O2 Saturation: 99 %
O2 Saturation: 99 %
Patient temperature: 36.4
Patient temperature: 36.5
Patient temperature: 36.5
TCO2: 27 mmol/L (ref 0–100)
TCO2: 28 mmol/L (ref 0–100)
TCO2: 26 mmol/L (ref 0–100)
TCO2: 22 mmol/L (ref 0–100)
TCO2: 20 mmol/L (ref 0–100)
TCO2: 23 mmol/L (ref 0–100)
pCO2 arterial: 43.6 mmHg (ref 35.0–45.0)
pCO2 arterial: 44.6 mmHg (ref 35.0–45.0)
pCO2 arterial: 44.1 mmHg (ref 35.0–45.0)
pCO2 arterial: 40.2 mmHg (ref 35.0–45.0)
pCO2 arterial: 26.9 mmHg — ABNORMAL LOW (ref 35.0–45.0)
pCO2 arterial: 40.6 mmHg (ref 35.0–45.0)
pH, Arterial: 7.378 (ref 7.350–7.450)
pH, Arterial: 7.391 (ref 7.350–7.450)
pH, Arterial: 7.347 — ABNORMAL LOW (ref 7.350–7.450)
pH, Arterial: 7.317 — ABNORMAL LOW (ref 7.350–7.450)
pH, Arterial: 7.466 — ABNORMAL HIGH (ref 7.350–7.450)
pH, Arterial: 7.326 — ABNORMAL LOW (ref 7.350–7.450)
pO2, Arterial: 557 mmHg — ABNORMAL HIGH (ref 80.0–100.0)
pO2, Arterial: 476 mmHg — ABNORMAL HIGH (ref 80.0–100.0)
pO2, Arterial: 409 mmHg — ABNORMAL HIGH (ref 80.0–100.0)
pO2, Arterial: 114 mmHg — ABNORMAL HIGH (ref 80.0–100.0)
pO2, Arterial: 147 mmHg — ABNORMAL HIGH (ref 80.0–100.0)
pO2, Arterial: 123 mmHg — ABNORMAL HIGH (ref 80.0–100.0)

## 2015-09-20 LAB — CBC
HCT: 36.8 % — ABNORMAL LOW (ref 39.0–52.0)
HCT: 32.6 % — ABNORMAL LOW (ref 39.0–52.0)
Hemoglobin: 12.9 g/dL — ABNORMAL LOW (ref 13.0–17.0)
Hemoglobin: 11.3 g/dL — ABNORMAL LOW (ref 13.0–17.0)
MCH: 32.3 pg (ref 26.0–34.0)
MCH: 31.9 pg (ref 26.0–34.0)
MCHC: 35.1 g/dL (ref 30.0–36.0)
MCHC: 34.7 g/dL (ref 30.0–36.0)
MCV: 92 fL (ref 78.0–100.0)
MCV: 92.1 fL (ref 78.0–100.0)
PLATELETS: 95 10*3/uL — AB (ref 150–400)
Platelets: 79 10*3/uL — ABNORMAL LOW (ref 150–400)
RBC: 4 MIL/uL — AB (ref 4.22–5.81)
RBC: 3.54 MIL/uL — ABNORMAL LOW (ref 4.22–5.81)
RDW: 12.8 % (ref 11.5–15.5)
RDW: 12.9 % (ref 11.5–15.5)
WBC: 14 10*3/uL — ABNORMAL HIGH (ref 4.0–10.5)
WBC: 11.3 10*3/uL — ABNORMAL HIGH (ref 4.0–10.5)

## 2015-09-20 LAB — POCT I-STAT, CHEM 8
BUN: 16 mg/dL (ref 6–20)
BUN: 14 mg/dL (ref 6–20)
BUN: 14 mg/dL (ref 6–20)
BUN: 16 mg/dL (ref 6–20)
BUN: 15 mg/dL (ref 6–20)
BUN: 15 mg/dL (ref 6–20)
BUN: 14 mg/dL (ref 6–20)
BUN: 11 mg/dL (ref 6–20)
Calcium, Ion: 1.23 mmol/L (ref 1.13–1.30)
Calcium, Ion: 1.19 mmol/L (ref 1.13–1.30)
Calcium, Ion: 1.01 mmol/L — ABNORMAL LOW (ref 1.13–1.30)
Calcium, Ion: 1.08 mmol/L — ABNORMAL LOW (ref 1.13–1.30)
Calcium, Ion: 1.11 mmol/L — ABNORMAL LOW (ref 1.13–1.30)
Calcium, Ion: 1.12 mmol/L — ABNORMAL LOW (ref 1.13–1.30)
Calcium, Ion: 1.12 mmol/L — ABNORMAL LOW (ref 1.13–1.30)
Calcium, Ion: 1.11 mmol/L — ABNORMAL LOW (ref 1.13–1.30)
Chloride: 104 mmol/L (ref 101–111)
Chloride: 106 mmol/L (ref 101–111)
Chloride: 105 mmol/L (ref 101–111)
Chloride: 105 mmol/L (ref 101–111)
Chloride: 104 mmol/L (ref 101–111)
Chloride: 105 mmol/L (ref 101–111)
Chloride: 104 mmol/L (ref 101–111)
Chloride: 106 mmol/L (ref 101–111)
Creatinine, Ser: 0.7 mg/dL (ref 0.61–1.24)
Creatinine, Ser: 0.7 mg/dL (ref 0.61–1.24)
Creatinine, Ser: 0.6 mg/dL — ABNORMAL LOW (ref 0.61–1.24)
Creatinine, Ser: 0.7 mg/dL (ref 0.61–1.24)
Creatinine, Ser: 0.7 mg/dL (ref 0.61–1.24)
Creatinine, Ser: 0.7 mg/dL (ref 0.61–1.24)
Creatinine, Ser: 0.7 mg/dL (ref 0.61–1.24)
Creatinine, Ser: 0.8 mg/dL (ref 0.61–1.24)
Glucose, Bld: 103 mg/dL — ABNORMAL HIGH (ref 65–99)
Glucose, Bld: 111 mg/dL — ABNORMAL HIGH (ref 65–99)
Glucose, Bld: 120 mg/dL — ABNORMAL HIGH (ref 65–99)
Glucose, Bld: 124 mg/dL — ABNORMAL HIGH (ref 65–99)
Glucose, Bld: 127 mg/dL — ABNORMAL HIGH (ref 65–99)
Glucose, Bld: 123 mg/dL — ABNORMAL HIGH (ref 65–99)
Glucose, Bld: 154 mg/dL — ABNORMAL HIGH (ref 65–99)
Glucose, Bld: 153 mg/dL — ABNORMAL HIGH (ref 65–99)
HCT: 40 % (ref 39.0–52.0)
HCT: 37 % — ABNORMAL LOW (ref 39.0–52.0)
HCT: 33 % — ABNORMAL LOW (ref 39.0–52.0)
HCT: 35 % — ABNORMAL LOW (ref 39.0–52.0)
HCT: 32 % — ABNORMAL LOW (ref 39.0–52.0)
HCT: 32 % — ABNORMAL LOW (ref 39.0–52.0)
HCT: 31 % — ABNORMAL LOW (ref 39.0–52.0)
HCT: 31 % — ABNORMAL LOW (ref 39.0–52.0)
Hemoglobin: 13.6 g/dL (ref 13.0–17.0)
Hemoglobin: 12.6 g/dL — ABNORMAL LOW (ref 13.0–17.0)
Hemoglobin: 11.2 g/dL — ABNORMAL LOW (ref 13.0–17.0)
Hemoglobin: 11.9 g/dL — ABNORMAL LOW (ref 13.0–17.0)
Hemoglobin: 10.9 g/dL — ABNORMAL LOW (ref 13.0–17.0)
Hemoglobin: 10.9 g/dL — ABNORMAL LOW (ref 13.0–17.0)
Hemoglobin: 10.5 g/dL — ABNORMAL LOW (ref 13.0–17.0)
Hemoglobin: 10.5 g/dL — ABNORMAL LOW (ref 13.0–17.0)
Potassium: 4.1 mmol/L (ref 3.5–5.1)
Potassium: 4.3 mmol/L (ref 3.5–5.1)
Potassium: 4.5 mmol/L (ref 3.5–5.1)
Potassium: 4.5 mmol/L (ref 3.5–5.1)
Potassium: 4.4 mmol/L (ref 3.5–5.1)
Potassium: 4.8 mmol/L (ref 3.5–5.1)
Potassium: 3.9 mmol/L (ref 3.5–5.1)
Potassium: 4 mmol/L (ref 3.5–5.1)
Sodium: 140 mmol/L (ref 135–145)
Sodium: 138 mmol/L (ref 135–145)
Sodium: 138 mmol/L (ref 135–145)
Sodium: 139 mmol/L (ref 135–145)
Sodium: 134 mmol/L — ABNORMAL LOW (ref 135–145)
Sodium: 137 mmol/L (ref 135–145)
Sodium: 138 mmol/L (ref 135–145)
Sodium: 139 mmol/L (ref 135–145)
TCO2: 27 mmol/L (ref 0–100)
TCO2: 25 mmol/L (ref 0–100)
TCO2: 27 mmol/L (ref 0–100)
TCO2: 26 mmol/L (ref 0–100)
TCO2: 26 mmol/L (ref 0–100)
TCO2: 26 mmol/L (ref 0–100)
TCO2: 24 mmol/L (ref 0–100)
TCO2: 20 mmol/L (ref 0–100)

## 2015-09-20 LAB — PLATELET COUNT: Platelets: 122 10*3/uL — ABNORMAL LOW (ref 150–400)

## 2015-09-20 LAB — POCT I-STAT 4, (NA,K, GLUC, HGB,HCT)
Glucose, Bld: 104 mg/dL — ABNORMAL HIGH (ref 65–99)
HCT: 37 % — ABNORMAL LOW (ref 39.0–52.0)
Hemoglobin: 12.6 g/dL — ABNORMAL LOW (ref 13.0–17.0)
Potassium: 4.1 mmol/L (ref 3.5–5.1)
Sodium: 140 mmol/L (ref 135–145)

## 2015-09-20 LAB — CREATININE, SERUM
Creatinine, Ser: 0.94 mg/dL (ref 0.61–1.24)
GFR calc Af Amer: >60 mL/min (ref >60)
GFR calc non Af Amer: >60 mL/min (ref >60)

## 2015-09-20 LAB — PROTIME-INR
INR: 1.85 — AB (ref 0.00–1.49)
PROTHROMBIN TIME: 21.3 s — AB (ref 11.6–15.2)

## 2015-09-20 LAB — APTT: aPTT: 45 seconds — ABNORMAL HIGH (ref 24–37)

## 2015-09-20 LAB — GLUCOSE, CAPILLARY: Glucose-Capillary: 94 mg/dL (ref 65–99)

## 2015-09-20 LAB — MAGNESIUM: Magnesium: 2.3 mg/dL (ref 1.7–2.4)

## 2015-09-20 LAB — HEMOGLOBIN AND HEMATOCRIT, BLOOD
HCT: 33.3 % — ABNORMAL LOW (ref 39.0–52.0)
Hemoglobin: 11.2 g/dL — ABNORMAL LOW (ref 13.0–17.0)

## 2015-09-20 SURGERY — BENTALL PROCEDURE
Anesthesia: General

## 2015-09-20 MED ORDER — FENTANYL CITRATE (PF) 250 MCG/5ML IJ SOLN
INTRAMUSCULAR | Status: AC
  Filled 2015-09-20: qty 5

## 2015-09-20 MED ORDER — SODIUM CHLORIDE 0.45 % IV SOLN
INTRAVENOUS | Status: DC | PRN
  Administered 2015-09-20: 20 mL/h via INTRAVENOUS

## 2015-09-20 MED ORDER — DEXMEDETOMIDINE HCL IN NACL 200 MCG/50ML IV SOLN
0.0000 ug/kg/h | INTRAVENOUS | Status: DC
  Administered 2015-09-20: 0.7 ug/kg/h via INTRAVENOUS

## 2015-09-20 MED ORDER — PROPOFOL 10 MG/ML IV BOLUS
INTRAVENOUS | Status: DC | PRN
  Administered 2015-09-20: 60 mg via INTRAVENOUS
  Administered 2015-09-20: 30 mg via INTRAVENOUS
  Administered 2015-09-20: 20 mg via INTRAVENOUS
  Administered 2015-09-20: 30 mg via INTRAVENOUS

## 2015-09-20 MED ORDER — BISACODYL 10 MG RE SUPP: 10.0000 mg | Freq: Every day | RECTAL | Status: DC

## 2015-09-20 MED ORDER — HEPARIN SODIUM (PORCINE) 1000 UNIT/ML IJ SOLN
INTRAMUSCULAR | Status: DC | PRN
  Administered 2015-09-20: 25000 [IU] via INTRAVENOUS

## 2015-09-20 MED ORDER — LIDOCAINE HCL (CARDIAC) 20 MG/ML IV SOLN
INTRAVENOUS | Status: DC | PRN
  Administered 2015-09-20: 80 mg via INTRAVENOUS
  Administered 2015-09-20: 20 mg via INTRAVENOUS

## 2015-09-20 MED ORDER — EPHEDRINE SULFATE 50 MG/ML IJ SOLN
INTRAMUSCULAR | Status: AC
  Filled 2015-09-20: qty 1

## 2015-09-20 MED ORDER — MAGNESIUM SULFATE 4 GM/100ML IV SOLN: 4.0000 g | Freq: Once | INTRAVENOUS | Status: DC

## 2015-09-20 MED ORDER — INSULIN REGULAR BOLUS VIA INFUSION
0.0000 [IU] | Freq: Three times a day (TID) | INTRAVENOUS | Status: DC
  Filled 2015-09-20: qty 10

## 2015-09-20 MED ORDER — CHLORHEXIDINE GLUCONATE 0.12 % MT SOLN
15.0000 mL | OROMUCOSAL | Status: AC
  Administered 2015-09-20: 15 mL via OROMUCOSAL

## 2015-09-20 MED ORDER — AMIODARONE HCL IN DEXTROSE 360-4.14 MG/200ML-% IV SOLN
30.0000 mg/h | INTRAVENOUS | Status: DC
  Filled 2015-09-20: qty 200

## 2015-09-20 MED ORDER — MIDAZOLAM HCL 2 MG/2ML IJ SOLN: 2.0000 mg | INTRAMUSCULAR | Status: DC | PRN

## 2015-09-20 MED ORDER — FENTANYL CITRATE (PF) 100 MCG/2ML IJ SOLN
INTRAMUSCULAR | Status: DC | PRN
  Administered 2015-09-20: 150 ug via INTRAVENOUS
  Administered 2015-09-20: 50 ug via INTRAVENOUS
  Administered 2015-09-20 (×2): 100 ug via INTRAVENOUS
  Administered 2015-09-20: 50 ug via INTRAVENOUS
  Administered 2015-09-20: 150 ug via INTRAVENOUS
  Administered 2015-09-20: 100 ug via INTRAVENOUS
  Administered 2015-09-20 (×2): 50 ug via INTRAVENOUS
  Administered 2015-09-20 (×2): 100 ug via INTRAVENOUS
  Administered 2015-09-20: 50 ug via INTRAVENOUS
  Administered 2015-09-20 (×2): 100 ug via INTRAVENOUS
  Administered 2015-09-20: 50 ug via INTRAVENOUS
  Administered 2015-09-20: 200 ug via INTRAVENOUS

## 2015-09-20 MED ORDER — MORPHINE SULFATE (PF) 2 MG/ML IV SOLN
2.0000 mg | INTRAVENOUS | Status: DC | PRN
  Administered 2015-09-21 (×4): 2 mg via INTRAVENOUS
  Filled 2015-09-20 (×5): qty 1

## 2015-09-20 MED ORDER — ROCURONIUM BROMIDE 50 MG/5ML IV SOLN
INTRAVENOUS | Status: AC
  Filled 2015-09-20: qty 1

## 2015-09-20 MED ORDER — LACTATED RINGERS IV SOLN: INTRAVENOUS | Status: DC

## 2015-09-20 MED ORDER — ASPIRIN 81 MG PO CHEW: 324.0000 mg | CHEWABLE_TABLET | Freq: Every day | ORAL | Status: DC

## 2015-09-20 MED ORDER — CEFUROXIME SODIUM 1.5 G IJ SOLR
1.5000 g | Freq: Two times a day (BID) | INTRAMUSCULAR | Status: DC
  Administered 2015-09-21 – 2015-09-22 (×3): 1.5 g via INTRAVENOUS
  Filled 2015-09-20 (×3): qty 1.5

## 2015-09-20 MED ORDER — ONDANSETRON HCL 4 MG/2ML IJ SOLN
4.0000 mg | Freq: Four times a day (QID) | INTRAMUSCULAR | Status: DC | PRN
  Administered 2015-09-20 – 2015-09-21 (×4): 4 mg via INTRAVENOUS
  Filled 2015-09-20 (×4): qty 2

## 2015-09-20 MED ORDER — AMIODARONE HCL 200 MG PO TABS
400.0000 mg | ORAL_TABLET | Freq: Two times a day (BID) | ORAL | Status: DC
  Administered 2015-09-21 – 2015-09-26 (×11): 400 mg via ORAL
  Filled 2015-09-20 (×13): qty 2

## 2015-09-20 MED ORDER — AMIODARONE HCL IN DEXTROSE 360-4.14 MG/200ML-% IV SOLN
60.0000 mg/h | INTRAVENOUS | Status: AC
  Administered 2015-09-20: 60 mg/h via INTRAVENOUS
  Filled 2015-09-20: qty 200

## 2015-09-20 MED ORDER — ANTISEPTIC ORAL RINSE SOLUTION (CORINZ): 7.0000 mL | Freq: Four times a day (QID) | OROMUCOSAL | Status: DC

## 2015-09-20 MED ORDER — DEXMEDETOMIDINE HCL IN NACL 400 MCG/100ML IV SOLN
0.4000 ug/kg/h | INTRAVENOUS | Status: DC
  Filled 2015-09-20: qty 100

## 2015-09-20 MED ORDER — HEPARIN SODIUM (PORCINE) 1000 UNIT/ML IJ SOLN
INTRAMUSCULAR | Status: AC
  Filled 2015-09-20: qty 1

## 2015-09-20 MED ORDER — ACETAMINOPHEN 500 MG PO TABS
1000.0000 mg | ORAL_TABLET | Freq: Four times a day (QID) | ORAL | Status: DC
  Administered 2015-09-20 – 2015-09-22 (×5): 1000 mg via ORAL
  Filled 2015-09-20 (×9): qty 2

## 2015-09-20 MED ORDER — SODIUM CHLORIDE 0.9 % IV SOLN: 250.0000 mL | INTRAVENOUS | Status: DC

## 2015-09-20 MED ORDER — VANCOMYCIN HCL IN DEXTROSE 1-5 GM/200ML-% IV SOLN
1000.0000 mg | Freq: Once | INTRAVENOUS | Status: AC
  Administered 2015-09-21: 1000 mg via INTRAVENOUS
  Filled 2015-09-20 (×2): qty 200

## 2015-09-20 MED ORDER — PANTOPRAZOLE SODIUM 40 MG PO TBEC: 40.0000 mg | DELAYED_RELEASE_TABLET | Freq: Every day | ORAL | Status: DC

## 2015-09-20 MED ORDER — SODIUM CHLORIDE 0.9 % IV SOLN: INTRAVENOUS | Status: DC

## 2015-09-20 MED ORDER — BISACODYL 5 MG PO TBEC
10.0000 mg | DELAYED_RELEASE_TABLET | Freq: Every day | ORAL | Status: DC
  Administered 2015-09-21: 10 mg via ORAL
  Filled 2015-09-20: qty 2

## 2015-09-20 MED ORDER — LACTATED RINGERS IV SOLN
500.0000 mL | Freq: Once | INTRAVENOUS | Status: DC | PRN
Start: 2015-09-20 — End: 2015-09-22

## 2015-09-20 MED ORDER — PHENYLEPHRINE HCL 10 MG/ML IJ SOLN
0.0000 ug/min | INTRAVENOUS | Status: DC
  Filled 2015-09-20: qty 2

## 2015-09-20 MED ORDER — SODIUM CHLORIDE 0.9 % IV SOLN
INTRAVENOUS | Status: DC | PRN
  Administered 2015-09-20: 15:00:00 via INTRAVENOUS

## 2015-09-20 MED ORDER — ARTIFICIAL TEARS OP OINT
TOPICAL_OINTMENT | OPHTHALMIC | Status: DC | PRN
  Administered 2015-09-20: 1 via OPHTHALMIC

## 2015-09-20 MED ORDER — PROPOFOL 10 MG/ML IV BOLUS
INTRAVENOUS | Status: AC
  Filled 2015-09-20: qty 20

## 2015-09-20 MED ORDER — MIDAZOLAM HCL 5 MG/5ML IJ SOLN
INTRAMUSCULAR | Status: DC | PRN
  Administered 2015-09-20: 3 mg via INTRAVENOUS
  Administered 2015-09-20: 1 mg via INTRAVENOUS
  Administered 2015-09-20: 3 mg via INTRAVENOUS
  Administered 2015-09-20: 2 mg via INTRAVENOUS
  Administered 2015-09-20: 1 mg via INTRAVENOUS

## 2015-09-20 MED ORDER — PROTAMINE SULFATE 10 MG/ML IV SOLN
INTRAVENOUS | Status: DC | PRN
  Administered 2015-09-20 (×3): 20 mg via INTRAVENOUS
  Administered 2015-09-20: 10 mg via INTRAVENOUS
  Administered 2015-09-20 (×4): 20 mg via INTRAVENOUS
  Administered 2015-09-20: 25 mg via INTRAVENOUS

## 2015-09-20 MED ORDER — LIDOCAINE HCL (CARDIAC) 20 MG/ML IV SOLN
INTRAVENOUS | Status: AC
  Filled 2015-09-20: qty 5

## 2015-09-20 MED ORDER — SODIUM CHLORIDE 0.9 % IJ SOLN
INTRAMUSCULAR | Status: AC
  Filled 2015-09-20: qty 20

## 2015-09-20 MED ORDER — CETYLPYRIDINIUM CHLORIDE 0.05 % MT LIQD
7.0000 mL | Freq: Two times a day (BID) | OROMUCOSAL | Status: DC
  Administered 2015-09-21 – 2015-09-25 (×8): 7 mL via OROMUCOSAL

## 2015-09-20 MED ORDER — AMIODARONE HCL IN DEXTROSE 360-4.14 MG/200ML-% IV SOLN
INTRAVENOUS | Status: DC | PRN
Start: 2015-09-20 — End: 2015-09-20
  Administered 2015-09-20: 60 mg/h via INTRAVENOUS

## 2015-09-20 MED ORDER — MIDAZOLAM HCL 10 MG/2ML IJ SOLN
INTRAMUSCULAR | Status: AC
  Filled 2015-09-20: qty 4

## 2015-09-20 MED ORDER — NITROGLYCERIN IN D5W 200-5 MCG/ML-% IV SOLN: 0.0000 ug/min | INTRAVENOUS | Status: DC

## 2015-09-20 MED ORDER — ALBUMIN HUMAN 5 % IV SOLN
250.0000 mL | INTRAVENOUS | Status: AC | PRN
  Administered 2015-09-20 (×4): 250 mL via INTRAVENOUS
  Filled 2015-09-20 (×2): qty 250

## 2015-09-20 MED ORDER — HEMOSTATIC AGENTS (NO CHARGE) OPTIME
TOPICAL | Status: DC | PRN
  Administered 2015-09-20 (×5): 1 via TOPICAL

## 2015-09-20 MED ORDER — ACETAMINOPHEN 650 MG RE SUPP
650.0000 mg | Freq: Once | RECTAL | Status: AC
  Administered 2015-09-20: 650 mg via RECTAL

## 2015-09-20 MED ORDER — METOPROLOL TARTRATE 1 MG/ML IV SOLN: 2.5000 mg | INTRAVENOUS | Status: DC | PRN

## 2015-09-20 MED ORDER — SODIUM CHLORIDE 0.9 % IJ SOLN
3.0000 mL | Freq: Two times a day (BID) | INTRAMUSCULAR | Status: DC
  Administered 2015-09-21 (×2): 3 mL via INTRAVENOUS

## 2015-09-20 MED ORDER — ROCURONIUM BROMIDE 50 MG/5ML IV SOLN
INTRAVENOUS | Status: AC
  Filled 2015-09-20: qty 2

## 2015-09-20 MED ORDER — LACTATED RINGERS IV SOLN
INTRAVENOUS | Status: DC | PRN
  Administered 2015-09-20 (×2): via INTRAVENOUS

## 2015-09-20 MED ORDER — INSULIN ASPART 100 UNIT/ML ~~LOC~~ SOLN
0.0000 [IU] | SUBCUTANEOUS | Status: DC
  Administered 2015-09-20: 2 [IU] via SUBCUTANEOUS
  Administered 2015-09-21: 5 [IU] via SUBCUTANEOUS
  Administered 2015-09-21: 4 [IU] via SUBCUTANEOUS
  Administered 2015-09-21 (×3): 2 [IU] via SUBCUTANEOUS

## 2015-09-20 MED ORDER — SODIUM CHLORIDE 0.9 % IJ SOLN: 3.0000 mL | INTRAMUSCULAR | Status: DC | PRN

## 2015-09-20 MED ORDER — FENTANYL CITRATE (PF) 250 MCG/5ML IJ SOLN
INTRAMUSCULAR | Status: AC
Start: 2015-09-20 — End: 2015-09-20
  Filled 2015-09-20: qty 5

## 2015-09-20 MED ORDER — SODIUM CHLORIDE 0.9 % IV SOLN
0.5000 g/h | Freq: Once | INTRAVENOUS | Status: DC
  Filled 2015-09-20: qty 20

## 2015-09-20 MED ORDER — ASPIRIN EC 325 MG PO TBEC
325.0000 mg | DELAYED_RELEASE_TABLET | Freq: Every day | ORAL | Status: DC
  Administered 2015-09-21: 325 mg via ORAL
  Filled 2015-09-20 (×3): qty 1

## 2015-09-20 MED ORDER — AMIODARONE HCL 200 MG PO TABS: 400.0000 mg | ORAL_TABLET | Freq: Every day | ORAL | Status: DC

## 2015-09-20 MED ORDER — POTASSIUM CHLORIDE 10 MEQ/50ML IV SOLN: 10.0000 meq | INTRAVENOUS | Status: AC

## 2015-09-20 MED ORDER — LACTATED RINGERS IV SOLN
INTRAVENOUS | Status: DC | PRN
  Administered 2015-09-20 (×2): via INTRAVENOUS

## 2015-09-20 MED ORDER — SODIUM CHLORIDE 0.9 % IV SOLN
INTRAVENOUS | Status: DC
  Filled 2015-09-20: qty 2.5

## 2015-09-20 MED ORDER — ACETAMINOPHEN 160 MG/5ML PO SOLN: 1000.0000 mg | Freq: Four times a day (QID) | ORAL | Status: DC

## 2015-09-20 MED ORDER — ARTIFICIAL TEARS OP OINT
TOPICAL_OINTMENT | OPHTHALMIC | Status: AC
  Filled 2015-09-20: qty 3.5

## 2015-09-20 MED ORDER — METOPROLOL TARTRATE 12.5 MG HALF TABLET
12.5000 mg | ORAL_TABLET | Freq: Two times a day (BID) | ORAL | Status: DC
  Administered 2015-09-21 (×2): 12.5 mg via ORAL
  Filled 2015-09-20 (×5): qty 1

## 2015-09-20 MED ORDER — ROCURONIUM BROMIDE 100 MG/10ML IV SOLN
INTRAVENOUS | Status: DC | PRN
  Administered 2015-09-20 (×2): 40 mg via INTRAVENOUS
  Administered 2015-09-20: 70 mg via INTRAVENOUS
  Administered 2015-09-20: 30 mg via INTRAVENOUS
  Administered 2015-09-20 (×2): 20 mg via INTRAVENOUS
  Administered 2015-09-20: 30 mg via INTRAVENOUS

## 2015-09-20 MED ORDER — MORPHINE SULFATE (PF) 2 MG/ML IV SOLN
1.0000 mg | INTRAVENOUS | Status: AC | PRN
  Administered 2015-09-20: 2 mg via INTRAVENOUS

## 2015-09-20 MED ORDER — 0.9 % SODIUM CHLORIDE (POUR BTL) OPTIME
TOPICAL | Status: DC | PRN
  Administered 2015-09-20: 6000 mL

## 2015-09-20 MED ORDER — OXYCODONE HCL 5 MG PO TABS: 5.0000 mg | ORAL_TABLET | ORAL | Status: DC | PRN

## 2015-09-20 MED ORDER — LIDOCAINE HCL (CARDIAC) 20 MG/ML IV SOLN: INTRAVENOUS | Status: DC | PRN

## 2015-09-20 MED ORDER — DOPAMINE-DEXTROSE 3.2-5 MG/ML-% IV SOLN: 0.0000 ug/kg/min | INTRAVENOUS | Status: DC

## 2015-09-20 MED ORDER — ALBUMIN HUMAN 5 % IV SOLN
INTRAVENOUS | Status: DC | PRN
  Administered 2015-09-20 (×3): via INTRAVENOUS

## 2015-09-20 MED ORDER — MAGNESIUM SULFATE 2 GM/50ML IV SOLN
2.0000 g | Freq: Once | INTRAVENOUS | Status: AC
  Administered 2015-09-20: 2 g via INTRAVENOUS
  Filled 2015-09-20: qty 50

## 2015-09-20 MED ORDER — LACTATED RINGERS IV SOLN
INTRAVENOUS | Status: DC | PRN
  Administered 2015-09-20: 08:00:00 via INTRAVENOUS

## 2015-09-20 MED ORDER — ACETAMINOPHEN 160 MG/5ML PO SOLN: 650.0000 mg | Freq: Once | ORAL | Status: AC

## 2015-09-20 MED ORDER — FAMOTIDINE IN NACL 20-0.9 MG/50ML-% IV SOLN
20.0000 mg | Freq: Two times a day (BID) | INTRAVENOUS | Status: AC
  Administered 2015-09-20: 20 mg via INTRAVENOUS

## 2015-09-20 MED ORDER — METOPROLOL TARTRATE 25 MG/10 ML ORAL SUSPENSION
12.5000 mg | Freq: Two times a day (BID) | ORAL | Status: DC
  Filled 2015-09-20 (×4): qty 5

## 2015-09-20 MED ORDER — CHLORHEXIDINE GLUCONATE 0.12% ORAL RINSE (MEDLINE KIT)
15.0000 mL | Freq: Two times a day (BID) | OROMUCOSAL | Status: DC
  Administered 2015-09-20: 15 mL via OROMUCOSAL

## 2015-09-20 MED ORDER — TRAMADOL HCL 50 MG PO TABS: 50.0000 mg | ORAL_TABLET | ORAL | Status: DC | PRN

## 2015-09-20 MED ORDER — DOCUSATE SODIUM 100 MG PO CAPS
200.0000 mg | ORAL_CAPSULE | Freq: Every day | ORAL | Status: DC
  Administered 2015-09-21: 200 mg via ORAL
  Filled 2015-09-20: qty 2

## 2015-09-20 MED FILL — Magnesium Sulfate Inj 50%: INTRAMUSCULAR | Qty: 10 | Status: AC

## 2015-09-20 MED FILL — Potassium Chloride Inj 2 mEq/ML: INTRAVENOUS | Qty: 40 | Status: AC

## 2015-09-20 MED FILL — Heparin Sodium (Porcine) Inj 1000 Unit/ML: INTRAMUSCULAR | Qty: 30 | Status: AC

## 2015-09-20 SURGICAL SUPPLY — 104 items
ADAPTER CARDIO PERF ANTE/RETRO (ADAPTER) ×2 IMPLANT
APL SRG 7X2 LUM MLBL SLNT (VASCULAR PRODUCTS) ×7
APPLICATOR COTTON TIP 6IN STRL (MISCELLANEOUS) IMPLANT
APPLICATOR TIP COSEAL (VASCULAR PRODUCTS) ×14 IMPLANT
ATRICLIP EXCLUSION 35 STD HAND (Clip) ×2 IMPLANT
BAG DECANTER FOR FLEXI CONT (MISCELLANEOUS) ×2 IMPLANT
BLADE STERNUM SYSTEM 6 (BLADE) ×2 IMPLANT
BLADE SURG 15 STRL LF DISP TIS (BLADE) ×1 IMPLANT
BLADE SURG 15 STRL SS (BLADE) ×2
CANISTER SUCTION 2500CC (MISCELLANEOUS) ×2 IMPLANT
CANN PRFSN 3/8X14X24FR PCFC (MISCELLANEOUS) ×1
CANN PRFSN 3/8XCNCT ST RT ANG (MISCELLANEOUS) ×1
CANNULA GUNDRY RCSP 15FR (MISCELLANEOUS) ×2 IMPLANT
CANNULA PRFSN 3/8X14X24FR PCFC (MISCELLANEOUS) ×1 IMPLANT
CANNULA PRFSN 3/8XCNCT RT ANG (MISCELLANEOUS) ×1 IMPLANT
CANNULA SUMP PERICARDIAL (CANNULA) ×2 IMPLANT
CANNULA VEN MTL TIP RT (MISCELLANEOUS) ×2
CARDIOBLATE CARDIAC ABLATION (MISCELLANEOUS) ×2
CATH CPB KIT GERHARDT (MISCELLANEOUS) ×2 IMPLANT
CATH HEART VENT LEFT (CATHETERS) ×1 IMPLANT
CATH ROBINSON RED A/P 18FR (CATHETERS) ×4 IMPLANT
CATH THORACIC 28FR (CATHETERS) ×2 IMPLANT
CATH/SQUID NICHOLS JEHLE COR (CATHETERS) ×2 IMPLANT
CAUTERY EYE LOW TEMP 1300F FIN (OPHTHALMIC RELATED) ×2 IMPLANT
CAUTERY SURG HI TEMP FINE TIP (MISCELLANEOUS) ×2 IMPLANT
CLIP FOGARTY SPRING 6M (CLIP) IMPLANT
CONN 1/2X1/2X1/2  BEN (MISCELLANEOUS) ×1
CONN 1/2X1/2X1/2 BEN (MISCELLANEOUS) ×1 IMPLANT
CONN 3/8X1/2 ST GISH (MISCELLANEOUS) ×4 IMPLANT
CONT SPEC 4OZ CLIKSEAL STRL BL (MISCELLANEOUS) ×2 IMPLANT
CRADLE DONUT ADULT HEAD (MISCELLANEOUS) ×2 IMPLANT
DEVICE CARDIOBLATE CARDIAC ABL (MISCELLANEOUS) ×1 IMPLANT
DRAIN CHANNEL 28F RND 3/8 FF (WOUND CARE) ×4 IMPLANT
DRAPE CARDIOVASCULAR INCISE (DRAPES) ×2
DRAPE SLUSH/WARMER DISC (DRAPES) ×2 IMPLANT
DRAPE SRG 135X102X78XABS (DRAPES) ×1 IMPLANT
DRSG AQUACEL AG ADV 3.5X14 (GAUZE/BANDAGES/DRESSINGS) ×2 IMPLANT
ELECT BLADE 4.0 EZ CLEAN MEGAD (MISCELLANEOUS) ×2
ELECT CAUTERY BLADE 6.4 (BLADE) ×2 IMPLANT
ELECT REM PT RETURN 9FT ADLT (ELECTROSURGICAL) ×6
ELECTRODE BLDE 4.0 EZ CLN MEGD (MISCELLANEOUS) ×1 IMPLANT
ELECTRODE REM PT RTRN 9FT ADLT (ELECTROSURGICAL) ×3 IMPLANT
FELT TEFLON 6X6 (MISCELLANEOUS) ×2 IMPLANT
GLOVE BIO SURGEON STRL SZ 6.5 (GLOVE) ×20 IMPLANT
GOWN STRL REUS W/ TWL LRG LVL3 (GOWN DISPOSABLE) ×10 IMPLANT
GOWN STRL REUS W/TWL LRG LVL3 (GOWN DISPOSABLE) ×10
GRAFT GELWEAVE VALSALVA 30CM (Prosthesis & Implant Heart) ×2 IMPLANT
HEMOSTAT POWDER SURGIFOAM 1G (HEMOSTASIS) ×10 IMPLANT
HEMOSTAT SURGICEL 2X14 (HEMOSTASIS) ×4 IMPLANT
INSERT FOGARTY XLG (MISCELLANEOUS) ×2 IMPLANT
KIT BASIN OR (CUSTOM PROCEDURE TRAY) ×2 IMPLANT
KIT CATH SUCT 8FR (CATHETERS) ×2 IMPLANT
KIT ROOM TURNOVER OR (KITS) ×2 IMPLANT
KIT SUCTION CATH 14FR (SUCTIONS) ×8 IMPLANT
LEAD PACING MYOCARDI (MISCELLANEOUS) ×4 IMPLANT
LINE VENT (MISCELLANEOUS) ×2 IMPLANT
LOOP VESSEL MINI RED (MISCELLANEOUS) ×2 IMPLANT
LOOP VESSEL SUPERMAXI WHITE (MISCELLANEOUS) ×2 IMPLANT
NEEDLE AORTIC AIR ASPIRATING (NEEDLE) ×2 IMPLANT
NS IRRIG 1000ML POUR BTL (IV SOLUTION) ×10 IMPLANT
PACK OPEN HEART (CUSTOM PROCEDURE TRAY) ×2 IMPLANT
PAD ARMBOARD 7.5X6 YLW CONV (MISCELLANEOUS) ×4 IMPLANT
PROBE CRYO2-ABLATION MALLABLE (MISCELLANEOUS) IMPLANT
SEALANT SURG COSEAL 8ML (VASCULAR PRODUCTS) ×4 IMPLANT
SET CARDIOPLEGIA MPS 5001102 (MISCELLANEOUS) ×2 IMPLANT
SPONGE GAUZE 4X4 12PLY STER LF (GAUZE/BANDAGES/DRESSINGS) ×4 IMPLANT
SPONGE LAP 18X18 X RAY DECT (DISPOSABLE) ×12 IMPLANT
STOPCOCK 4 WAY LG BORE MALE ST (IV SETS) IMPLANT
SUT ETHIBON 2 0 V 52N 30 (SUTURE) ×6 IMPLANT
SUT ETHIBOND 2 0 SH (SUTURE) ×5
SUT ETHIBOND 2 0 SH 36X2 (SUTURE) ×5 IMPLANT
SUT ETHIBOND NAB MH 2-0 36IN (SUTURE) ×4 IMPLANT
SUT PROLENE 3 0 RB 1 (SUTURE) ×2 IMPLANT
SUT PROLENE 3 0 SH1 36 (SUTURE) ×6 IMPLANT
SUT PROLENE 4 0 RB 1 (SUTURE) ×4
SUT PROLENE 4 0 TF (SUTURE) ×4 IMPLANT
SUT PROLENE 4-0 RB1 .5 CRCL 36 (SUTURE) ×4 IMPLANT
SUT PROLENE 5 0 C 1 36 (SUTURE) ×20 IMPLANT
SUT PROLENE 6 0 C 1 30 (SUTURE) ×4 IMPLANT
SUT PROLENE 6 0 CC (SUTURE) ×2 IMPLANT
SUT PROLENE 7 0 BV1 MDA (SUTURE) ×2 IMPLANT
SUT PROLENE 8 0 BV175 6 (SUTURE) ×4 IMPLANT
SUT SILK  1 MH (SUTURE) ×3
SUT SILK 1 MH (SUTURE) ×3 IMPLANT
SUT SILK 1 TIES 10X30 (SUTURE) ×2 IMPLANT
SUT SILK 2 0 SH CR/8 (SUTURE) ×4 IMPLANT
SUT SILK 3 0 SH CR/8 (SUTURE) ×2 IMPLANT
SUT SILK 4 0 TIE 10X30 (SUTURE) ×4 IMPLANT
SUT STEEL 6MS V (SUTURE) ×2 IMPLANT
SUT TEM PAC WIRE 2 0 SH (SUTURE) ×8 IMPLANT
SUT VIC AB 1 CTX 18 (SUTURE) ×6 IMPLANT
SUT VIC AB 2-0 CTX 27 (SUTURE) ×4 IMPLANT
SUT VIC AB 3-0 X1 27 (SUTURE) ×4 IMPLANT
SYR 10ML KIT SKIN ADHESIVE (MISCELLANEOUS) ×2 IMPLANT
SYSTEM SAHARA CHEST DRAIN ATS (WOUND CARE) ×2 IMPLANT
TAPE CLOTH SURG 4X10 WHT LF (GAUZE/BANDAGES/DRESSINGS) ×2 IMPLANT
TAPE CLOTH SURG 6X10 WHT LF (GAUZE/BANDAGES/DRESSINGS) ×2 IMPLANT
TOWEL OR 17X24 6PK STRL BLUE (TOWEL DISPOSABLE) ×4 IMPLANT
TOWEL OR 17X26 10 PK STRL BLUE (TOWEL DISPOSABLE) ×4 IMPLANT
TRAY FOLEY IC TEMP SENS 16FR (CATHETERS) ×2 IMPLANT
UNDERPAD 30X30 INCONTINENT (UNDERPADS AND DIAPERS) ×2 IMPLANT
VALVE MAGNA EASE AORTIC 25MM (Prosthesis & Implant Heart) ×2 IMPLANT
VENT LEFT HEART 12002 (CATHETERS) ×2
WATER STERILE IRR 1000ML POUR (IV SOLUTION) ×4 IMPLANT

## 2015-09-20 NOTE — Anesthesia Preprocedure Evaluation (Addendum)
Anesthesia Evaluation  Patient identified by MRN, date of birth, ID band Patient awake    Reviewed: Allergy & Precautions, NPO status , Patient's Chart, lab work & pertinent test results  History of Anesthesia Complications Negative for: history of anesthetic complications  Airway Mallampati: I  TM Distance: >3 FB Neck ROM: Full    Dental  (+) Teeth Intact   Pulmonary neg pulmonary ROS,    breath sounds clear to auscultation       Cardiovascular hypertension, Pt. on home beta blockers and Pt. on medications + dysrhythmias Atrial Fibrillation  Rhythm:Irregular Rate:Abnormal     Neuro/Psych negative neurological ROS  negative psych ROS   GI/Hepatic negative GI ROS, Neg liver ROS,   Endo/Other  negative endocrine ROS  Renal/GU negative Renal ROS  negative genitourinary   Musculoskeletal negative musculoskeletal ROS (+)   Abdominal   Peds negative pediatric ROS (+)  Hematology negative hematology ROS (+)   Anesthesia Other Findings   Reproductive/Obstetrics                           Anesthesia Physical Anesthesia Plan  ASA: IV  Anesthesia Plan: General   Post-op Pain Management:    Induction: Intravenous  Airway Management Planned: Oral ETT  Additional Equipment: Arterial line, CVP, PA Cath, TEE and Ultrasound Guidance Line Placement  Intra-op Plan:   Post-operative Plan: Post-operative intubation/ventilation  Informed Consent: I have reviewed the patients History and Physical, chart, labs and discussed the procedure including the risks, benefits and alternatives for the proposed anesthesia with the patient or authorized representative who has indicated his/her understanding and acceptance.   Dental advisory given  Plan Discussed with: CRNA and Surgeon  Anesthesia Plan Comments:        Anesthesia Quick Evaluation

## 2015-09-20 NOTE — H&P (Signed)
[address]       [place] [address]             [phone]                    Kinser W Ziska Carrington Medical Record #[id number] Date of Birth: Mar 28, 1951  Referring: No ref. provider found Primary Care: [name], MD  Chief Complaint:    No chief complaint on file.   History of Present Illness:    Chad Lawrence 64 y.o. male is seen in the office  today for new diagnosis of AI, dilated aortic root and Afib of unknown duration. For several months especially during the summer patient had several episodes of light headed feeling but without syncope. Events noted with exertion, coaching baseball in heat, playing golf amd recently climbing stairs.  He has no previous cardiac history. He had a regular appointment with primary care and was found to be in afib. Eliquis was started  and patient referred to Dr Chad Lawrence.   Family history is significant that there is no family history of dilated ascending aorta or dissection, but father  died  58 with ruptured abdominal aneurysm. An uncle ( father brother) died of ?mi in his 81's without known details.  5 children, 3 step childrenand two adopted.    Current Activity/ Functional Status:  Patient is independent with mobility/ambulation, transfers, ADL's, IADL's.   Zubrod Score: At the time of surgery this patient's most appropriate activity status/level should be described as: [x]     0    Normal activity, no symptoms []     1    Restricted in physical strenuous activity but ambulatory, able to do out light work []     2    Ambulatory and capable of self care, unable to do work activities, up and about               >50 % of waking hours                              []     3    Only limited self care, in bed greater than 50% of waking hours []     4    Completely disabled, no self care, confined to bed or chair []     5    Moribund   Past Medical History  Diagnosis Date  . Atrial fibrillation ([place])   .  Aortic regurgitation   . Ascending aorta dilatation ([facility])   . Pneumonia     Past Surgical History  Procedure Laterality Date  . Cardiac catheterization N/A 09/12/2015    Procedure: Left Heart Cath and Coronary Angiography;  Surgeon: Adrian Prows, MD;  Location: [facility];  Service: Cardiovascular;  Laterality: N/A;  . Hernia repair    . Colonoscopy      x 2    Family History  Problem Relation Age of Onset  . Hypertension Mother   . Breast cancer Mother   . Atrial fibrillation Mother   . Emphysema Father   . Aneurysm Father     abdominal aortic aneurysm  . COPD Father     Social History   Social History  . Marital Status: Married    Spouse Name: N/A  . Number of Children: 5  . Years of Education: N/A   Occupational History  . Not on file.   Social History  Main Topics  . Smoking status: Never Smoker   . Smokeless tobacco: Never Used  . Alcohol Use: No  . Drug Use: No  . Sexual Activity: Not on file          Social History Narrative  Works at [employer] for [employer], work involves traveling   History  Smoking status  . Never Smoker   Smokeless tobacco  . Never Used    History  Alcohol Use  . 0.0 oz/week  . 0 Standard drinks or equivalent per week    Comment: occasional     No Known Allergies  Current Facility-Administered Medications  Medication Dose Route Frequency Provider Last Rate Last Dose  . aminocaproic acid (AMICAR) 10 g in sodium chloride 0.9 % 100 mL infusion   Intravenous To OR [name], MD      . cefUROXime (ZINACEF) 1.5 g in dextrose 5 % 50 mL IVPB  1.5 g Intravenous To OR [name], MD      . cefUROXime (ZINACEF) 750 mg in dextrose 5 % 50 mL IVPB  750 mg Intravenous To OR [name], MD      . chlorhexidine (HIBICLENS) 4 % liquid 2 application  30 mL Topical UD [name], MD      . chlorhexidine (PERIDEX) 0.12 % solution 15 mL  15 mL Mouth/Throat Once [name], MD      .  dexmedetomidine (PRECEDEX) 400 MCG/100ML (4 mcg/mL) infusion  0.1-0.7 mcg/kg/hr Intravenous To OR [name], MD      . DOPamine (INTROPIN) 800 mg in dextrose 5 % 250 mL (3.2 mg/mL) infusion  0-10 mcg/kg/min Intravenous To OR [name], MD      . EPINEPHrine (ADRENALIN) 4 mg in dextrose 5 % 250 mL (0.016 mg/mL) infusion  0-10 mcg/min Intravenous To OR [name], MD      . heparin 2,500 Units, papaverine 30 mg in electrolyte-148 (PLASMALYTE-148) 500 mL irrigation   Irrigation To OR [name], MD      . heparin 30,000 units/NS 1000 mL solution for CELLSAVER   Other To OR [name], MD      . insulin regular (NOVOLIN R,HUMULIN R) 250 Units in sodium chloride 0.9 % 250 mL (1 Units/mL) infusion   Intravenous To OR [name], MD      . magnesium sulfate (IV Push/IM) injection 40 mEq  40 mEq Other To OR [name], MD      . metoprolol tartrate (LOPRESSOR) tablet 12.5 mg  12.5 mg Oral Once [name], MD      . nitroGLYCERIN 50 mg in dextrose 5 % 250 mL (0.2 mg/mL) infusion  2-200 mcg/min Intravenous To OR [name], MD      . phenylephrine (NEO-SYNEPHRINE) 20 mg in dextrose 5 % 250 mL (0.08 mg/mL) infusion  30-200 mcg/min Intravenous To OR [name], MD      . potassium chloride injection 80 mEq  80 mEq Other To OR [name], MD      . vancomycin (VANCOCIN) 1,250 mg in sodium chloride 0.9 % 250 mL IVPB  1,250 mg Intravenous To OR [name], MD          Review of Systems:     Cardiac Review of Systems: Y or N  Chest Pain [  n  ]  Resting SOB [n   ] Exertional SOB  [  n]  Orthopnea [  n  ]   Pedal Edema [   ]    Palpitations [ nn ] Syncope  [ n ]   Presyncope [ y  ]  General Review of Systems: [Y] = yes [  ]=no Constitional: recent weight change [ n ];  Wt loss over the last 3 months [   ] anorexia [  ]; fatigue [  ]; nausea [  ]; night sweats [  ]; fever [  ]; or chills [  ];          Dental: poor dentition[n   ]; Last Dentist visit:regular visits   Eye : blurred vision [  ]; diplopia [   ]; vision changes [  ];  Amaurosis fugax[  ]; Resp: cough [  ];  wheezing[  ];  hemoptysis[  ]; shortness of breath[  ]; paroxysmal nocturnal dyspnea[  ]; dyspnea on exertion[  ]; or orthopnea[  ];  GI:  gallstones[  ], vomiting[  ];  dysphagia[  ]; melena[  ];  hematochezia [  ]; heartburn[  ];   Hx of  Colonoscopy[yes  5 years ago  ]; GU: kidney stones [  ]; hematuria[n  ];   dysuria [  ];  nocturia[  ];  history of     obstruction [  ]; urinary frequency [n  ]             Skin: rash, swelling[  ];, hair loss[  ];  peripheral edema[  ];  or itching[  ]; Musculosketetal: myalgias[  ];  joint swelling[  ];  joint erythema[  ];  joint pain[  ];  back pain[  ];  Heme/Lymph: bruising[  ];  bleeding[  ];  anemia[  ];  Neuro: TIA[  ];  headaches[  ];  stroke[  ];  vertigo[  ];  seizures[  ];   paresthesias[  ];  difficulty walking[n  ];  Psych:depression[  ]; anxiety[  ];  Endocrine: diabetes[n  ];  thyroid dysfunction[nn  ];  Immunizations: Flu up to date [  y]; Pneumococcal up to date [n  ];  Other:  Physical Exam: There were no vitals taken for this visit.  PHYSICAL EXAMINATION: General appearance: alert, cooperative, appears stated age, no distress and no marfan features Head: Normocephalic, without obvious abnormality, atraumatic Neck: no adenopathy, no carotid bruit, no JVD, supple, symmetrical, trachea midline and thyroid not enlarged, symmetric, no tenderness/mass/nodules Lymph nodes: Cervical, supraclavicular, and axillary nodes normal. Resp: clear to auscultation bilaterally Back: symmetric, no curvature. ROM normal. No CVA tenderness. Cardio: irregularly irregular rhythm and diastolic murmur: late diastolic 3/6, decrescendo at 2nd right intercostal space GI: soft, non-tender; bowel sounds normal; no masses,  no organomegaly Extremities: extremities normal, atraumatic, no cyanosis or edema Neurologic:  Grossly normal  Diagnostic Studies & Laboratory data:     Recent Radiology Findings:  CATH: Dominance: Co-dominant   Left Anterior Descending   . Ost LAD to Prox LAD lesion, 0% stenosed. Proximal LAD diffuse ectasia/minimally aneurysmal     Left Circumflex   . Mid Cx to Dist Cx lesion, 0% stenosed. Mid segment Ectatic/mildly aneurysmal     Right Coronary Artery   . Mid RCA lesion, 30% stenosed. diffuse      no root or lv ejection, aorta appears dilated but con not bee evaluated by cath  I have independently reviewed the above  cath films and reviewed the findings with the  patient .    Dg Chest 2 View  09/18/2015  CLINICAL DATA:  Preop for abrupt replacement EXAM: CHEST  2 VIEW COMPARISON:  08/27/2005 FINDINGS: Cardiomediastinal silhouette is unremarkable. Mild degenerative changes thoracic spine. No acute infiltrate or pleural effusion. No pulmonary edema. Again noted mild mid thoracic levoscoliosis. IMPRESSION: No active cardiopulmonary disease. Electronically Signed   By: Lahoma Crocker M.D.   On: 09/18/2015 10:57   US Soft Tissue Head/neck  09/18/2015  CLINICAL DATA:  64 year old male with a right-sided thyroid nodule seen on CT scan of the chest EXAM: THYROID ULTRASOUND TECHNIQUE: Ultrasound examination of the thyroid gland and adjacent soft tissues was performed. COMPARISON:  CT scan of the chest 09/14/2015 FINDINGS: Right thyroid lobe Measurements: 7.1 x 3.3 x 3.5 cm. 1. Dominant mixed cystic and solid but predominantly solid nodule occupying the majority of the right mid and lower gland measures approximately 4.7 x 3.5 x 3.4 cm. The internal cystic component contains low-level internal echoes consistent with debris. Left thyroid lobe Measurements: 6.1 x 2.0 x 1.4 cm. Multiple small (less than 1 cm) hypoechoic nodules scattered throughout the left gland. The largest measures up to 8 mm in the deep aspect of the lower gland. Solitary 2 mm dystrophic calcification. Isthmus Thickness:  0.3 cm.  No nodules visualized. Lymphadenopathy None visualized. IMPRESSION: Dominant nearly 4.7 cm mixed complex cystic and solid mass in the right thyroid gland. Findings meet consensus criteria for biopsy. Ultrasound-guided fine needle aspiration should be considered, as per the consensus statement: Management of Thyroid Nodules Detected at [place]: Society of Radiologists in [place]. Radiology 2005; [id number]. Additional small (less than 1 cm) left-sided thyroid nodules noted incidentally. Electronically Signed   By: Jacqulynn Cadet M.D.   On: 09/18/2015 16:45   Ct Angio Chest Aorta W/cm &/or Wo/cm  09/14/2015  CLINICAL DATA:  Followup for thoracic aortic aneurysm. Patient recently had a heart catheterization. EXAM: CT ANGIOGRAPHY CHEST WITH CONTRAST TECHNIQUE: Multidetector CT imaging of the chest was performed using the standard protocol during bolus administration of intravenous contrast. Multiplanar CT image reconstructions and MIPs were obtained to evaluate the vascular anatomy. CONTRAST:  75 mL of Isovue 370 intravenous contrast COMPARISON:  Chest radiograph, 08/27/2005 FINDINGS: Angiographic study: The ascending aorta is dilated. The was measured just above the origin of the left coronary artery right measures 6.6 x 6.3 cm. Aorta is dilated beginning just distal to the aortic valve. Becomes normal in caliber as it extends into the aortic arch. Descending thoracic aorta is normal in caliber. There is no dissection. There is no significant plaque. Aortic arch branch vessels are widely patent. The visualized upper abdominal aorta shows mild partly calcified plaque. The branch vessels are widely patent. Thoracic inlet: Heterogeneous right thyroid lobe nodule measuring 3.9 x 3.1 x 3.7 cm. Sub cm nodules seen above this in the posterior upper pole of the right lobe. No neck base masses or adenopathy. Mediastinum and hila: Heart is normal in size. No coronary artery  calcifications. Oval low-density homogeneous mass lies along the left pleural margin of the mediastinum adjacent to the main pulmonary artery. It measures 2.2 x 1.6 x 2.2 cm with Hounsfield units of 0 assistant with water. This has the appearance of the cysts. It may be a small pericardial or bronchogenic cyst. Is unlikely to be significant. No other mediastinal an no hilar masses or pathologically enlarged lymph nodes. 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 Mild areas posterior lower lobe reticular opacity likely due to scarring, subsegmental atelectasis or combination. There  is no evidence of pneumonia or pulmonary edema. There is no lung mass or suspicious nodule. No pleural effusion or pneumothorax. Small area of scarring or atelectasis at the posterior right lung base is associated with focus of pleural thickening and calcification. Limited upper abdomen: Small low-density lesions noted in the liver, nonspecific but likely cysts. Otherwise unremarkable. Musculoskeletal: Degenerative changes of the thoracic spine. Mild levoscoliosis. No osteoblastic or osteolytic lesions. Review of the MIP images confirms the above findings. IMPRESSION: 1. 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 It was measured just above the left coronary artery with maximum transverse dimensions of 6.6 x 6.3 cm. No aortic dissection. No significant plaque. 2. No acute findings in the chest. 3. 2.2 cm cystic lesion along the left mediastinum likely a pericardial or bronchogenic cyst. This is not felt to need further evaluation. 4. 3.9 cm heterogeneous right thyroid nodule. Recommend follow-up ultrasound and possible biopsy if this has not been previously performed. Electronically Signed   By: Lajean Manes M.D.   On: 09/14/2015 11:45   I have independently reviewed the above radiology studies  and reviewed the findings with the patient.    I have  independently reviewed the above radiologic studies.  ECHO: done in Dr Chad Lawrence office: Report: Nl  global lv function 65 % ef Moderate to severe AI, tricuspid valve  lvDd 4.7 cm  Aortic Size Index=    6.6     /There is no height or weight on file to calculate BSA. 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 If aortic valve replacement needed I discussed with the patient tissue vs mechanical valve use. Pro and cons of each were discussed. Patient prefers tissue valve rather mechanical. Risks of surgery discussed.   The goals risks and alternatives of the planned surgical procedure aortic root replacement with aortic valve repair or replacement and possible  MAZE and atrial clipping  have been discussed with the patient in detail. The risks of the procedure including death, infection, stroke, myocardial infarction, bleeding, blood transfusion have all been discussed specifically.  I have quoted Derek Jack a 4 % of perioperative mortality and a complication rate as high as 20 %. The patient's questions have been answered.JAMMAL SARR is willing  to proceed with  the planned procedure.  [name] MD      [address] [place] [address] Office [phone]   Beeper [phone]  09/20/2015 6:49 AM

## 2015-09-20 NOTE — Anesthesia Procedure Notes (Addendum)
Anesthesia Procedure Note Central line insertion note. Skin prepped and draped in sterile fashion. Patent vessel identified on u/s using linear probe. Needle advanced under live u/s guidance with aspiration of blood upon entry into vessel. Catheter passed easily over finder needle. Wire passed easily through catheter and location confirmed with u/s. Image saved in chart. Introducer catheter advanced over wire, with aspiration of blood through all ports for confirmation. Line sutured and dressing applied. Pt tolerated well with no immediate complications.  Deatra Canter, MD Date/Time: 09/20/2015 9:06 AM Performed by: Izora Gala Pre-anesthesia Checklist: Patient identified, Emergency Drugs available, Suction available, Patient being monitored and Timeout performed Patient Re-evaluated:Patient Re-evaluated prior to inductionOxygen Delivery Method: Circle system utilized Preoxygenation: Pre-oxygenation with 100% oxygen Intubation Type: IV induction Ventilation: Oral airway inserted - appropriate to patient size and Mask ventilation without difficulty Laryngoscope Size: Mac and 4 Grade View: Grade II Tube type: Oral Number of attempts: 1 Airway Equipment and Method: Stylet Placement Confirmation: ETT inserted through vocal cords under direct vision,  positive ETCO2 and breath sounds checked- equal and bilateral Secured at: 23 cm Tube secured with: Tape Dental Injury: Teeth and Oropharynx as per pre-operative assessment    Anesthesia Procedure Note Central line insertion note. Skin prepped and draped in sterile fashion. Patent vessel identified on u/s using linear probe. Needle advanced under live u/s guidance with aspiration of blood upon entry into vessel. Catheter passed easily over finder needle. Wire passed easily through catheter and location confirmed with u/s. Image saved in chart. Introducer catheter advanced over wire, with aspiration of blood through all ports for confirmation. Line  sutured and dressing applied. Pt tolerated well with no immediate complications.  Deatra Canter, MD

## 2015-09-20 NOTE — Progress Notes (Signed)
  Echocardiogram Echocardiogram Transesophageal has been performed.  Bobbye Charleston 09/20/2015, 9:51 AM

## 2015-09-20 NOTE — Brief Op Note (Addendum)
      [address]       [place] [address]             [phone]       2:44 PM  PATIENT:  Chad Lawrence  64 y.o. male  PRE-OPERATIVE DIAGNOSIS:  1. Moderate to severe aortic Insufficiency 2. Dilated aortic root 6.8 cm  3. History of afib  POST-OPERATIVE DIAGNOSIS:   1. Moderate to severe aortic Insufficiency 2. Dilated aortic root 3. History of afib   PROCEDURE: TRANSESOPHAGEAL ECHOCARDIOGRAM (TEE), MEDIAN STERNOTOMY for BENTALL PROCEDURE (using a 25 mm Pericardial tissue valve), Bipolar MAZE- left side lesion , and CLIPPING OF ATRIAL APPENDAGE (N/A)  SURGEON:  Surgeon(s) and Role:    * [name], MD - Primary  PHYSICIAN ASSISTANT: Lars Pinks PA-C  ANESTHESIA:   general  EBL:  Total I/O In: -  Out: 8177 [Urine:1125] no blood products given  DRAINS: Chest tubes placed in the mediastinal and pleural spaces   SPECIMEN:  Source of Specimen:  Portion of aorta and native aortic valve leaflets  DISPOSITION OF SPECIMEN:  PATHOLOGY  COUNTS CORRECT:  YES  DICTATION: .Dragon Dictation  PLAN OF CARE: Admit to inpatient   PATIENT DISPOSITION:  ICU - intubated and hemodynamically stable.   Delay start of Pharmacological VTE agent (>24hrs) due to surgical blood loss or risk of bleeding: yes  BASELINE WEIGHT: 82.7 kg  Aortic Valve  Procedure Performed:  Replacement: Yes.  Bioprosthetic Valve. Implant Model Number:[id number], Size:25 mm, Unique Device Identifier:[id number]  Repair/Reconstruction: No.   Aortic Annular Enlargement: Yes.   Aortic Valve Etiology   Aortic Insufficiency:  Severe  Aortic Valve Disease:  Yes.  Aortic Stenosis:  No.  Etiology (Choose at least one and up to  5 etiologies):  Primary Aortic Disease, Idiopathic Root Dilation   Maze Procedure  Radiofrequency:  Yes.  Bipolar: Yes.  Cut-and-sew:  No.  Cryo: No     Lesions (select all that apply):    1   Pulmonary Vein Isolation and     7  LAA  Ligation/Removal

## 2015-09-20 NOTE — Progress Notes (Signed)
  Amiodarone Drug - Drug Interaction Consult Note  Recommendations: No significant drug interactions identified.  Amiodarone is metabolized by the cytochrome P450 system and therefore has the potential to cause many drug interactions. Amiodarone has an average plasma half-life of 50 days (range 20 to 100 days).   There is potential for drug interactions to occur several weeks or months after stopping treatment and the onset of drug interactions may be slow after initiating amiodarone.   []  Statins: Increased risk of myopathy. Simvastatin- restrict dose to 20mg  daily. Other statins: counsel patients to report any muscle pain or weakness immediately.  []  Anticoagulants: Amiodarone can increase anticoagulant effect. Consider warfarin dose reduction. Patients should be monitored closely and the dose of anticoagulant altered accordingly, remembering that amiodarone levels take several weeks to stabilize.  []  Antiepileptics: Amiodarone can increase plasma concentration of phenytoin, the dose should be reduced. Note that small changes in phenytoin dose can result in large changes in levels. Monitor patient and counsel on signs of toxicity.  [x]  Beta blockers: increased risk of bradycardia, AV block and myocardial depression. Sotalol - avoid concomitant use.   On metoprolol    []   Calcium channel blockers (diltiazem and verapamil): increased risk of bradycardia, AV block and myocardial depression.  []   Cyclosporine: Amiodarone increases levels of cyclosporine. Reduced dose of cyclosporine is recommended.  []  Digoxin dose should be halved when amiodarone is started.  []  Diuretics: increased risk of cardiotoxicity if hypokalemia occurs.  []  Oral hypoglycemic agents (glyburide, glipizide, glimepiride): increased risk of hypoglycemia. Patient's glucose levels should be monitored closely when initiating amiodarone therapy.   []  Drugs that prolong the QT interval:  Torsades de pointes risk may be  increased with concurrent use - avoid if possible.  Monitor QTc, also keep magnesium/potassium WNL if concurrent therapy can't be avoided. Marland Kitchen Antibiotics: e.g. fluoroquinolones, erythromycin. . Antiarrhythmics: e.g. quinidine, procainamide, disopyramide, sotalol. . Antipsychotics: e.g. phenothiazines, haloperidol.  . Lithium, tricyclic antidepressants, and methadone. Thank You,  Manley Mason  09/20/2015 5:11 PM

## 2015-09-20 NOTE — Progress Notes (Addendum)
Patient states that he took his metoprolol 25 mg tablet at 1900 the night before surgery.  Notified Dr. Servando Snare and received verbal order to give patient 12.5 mg Metoprolol.

## 2015-09-20 NOTE — Progress Notes (Addendum)
TCTS DAILY ICU PROGRESS NOTE                   [address]            [place] [address]          [phone]   Day of Surgery Procedure(s) (LRB): BENTALL PROCEDURE (N/A) TRANSESOPHAGEAL ECHOCARDIOGRAM (TEE) (N/A) MAZE (N/A) CLIPPING OF ATRIAL APPENDAGE (N/A)  Total Length of Stay:  LOS: 0 days   Subjective: Patient with some incisional pain as just coughed.   Objective: Vital signs in last 24 hours: Temp:  [97.2 F (36.2 C)-98.1 F (36.7 C)] 98.1 F (36.7 C) (11/02 2100) Pulse Rate:  [79-90] 90 (11/02 2100) Cardiac Rhythm:  [-] Atrial paced (11/02 2000) Resp:  [11-20] 20 (11/02 2100) BP: (82-134)/(62-78) 91/62 mmHg (11/02 2100) SpO2:  [99 %-100 %] 100 % (11/02 2100) Arterial Line BP: (88-116)/(54-67) 95/55 mmHg (11/02 2100) FiO2 (%):  [40 %-50 %] 40 % (11/02 1952) Weight:  [182 lb 6.4 oz (82.736 kg)] 182 lb 6.4 oz (82.736 kg) (11/02 0657)  Filed Weights   09/20/15 0657  Weight: 182 lb 6.4 oz (82.736 kg)    Weight change:    Hemodynamic parameters for last 24 hours: PAP: (18-23)/(11-17) 22/11 mmHg CO:  [3.3 L/min-4.4 L/min] 4.4 L/min CI:  [1.6 L/min/m2-2.2 L/min/m2] 2.2 L/min/m2     Intake/Output this shift: Total I/O In: 210 [I.V.:210] Out: 455 [Urine:400; Chest Tube:55]  Current Meds: Scheduled Meds: . [START ON 09/21/2015] acetaminophen  1,000 mg Oral 4 times per day   Or  . [START ON 09/21/2015] acetaminophen (TYLENOL) oral liquid 160 mg/5 mL  1,000 mg Per Tube 4 times per day  . [START ON 09/21/2015] amiodarone  400 mg Oral Q12H   Followed by  . [START ON 09/28/2015] amiodarone  400 mg Oral Daily  . [START ON 09/21/2015] antiseptic oral rinse  7 mL Mouth Rinse QID  . [START ON 09/21/2015] aspirin EC  325 mg Oral Daily   Or  . [START ON 09/21/2015] aspirin  324 mg Per Tube Daily  . [START ON 09/21/2015] bisacodyl  10 mg Oral Daily   Or  . [START ON 09/21/2015] bisacodyl  10 mg Rectal Daily  . [START ON 09/21/2015] cefUROXime (ZINACEF)   IV  1.5 g Intravenous Q12H  . chlorhexidine gluconate  15 mL Mouth Rinse BID  . [START ON 09/21/2015] docusate sodium  200 mg Oral Daily  . famotidine (PEPCID) IV  20 mg Intravenous Q12H  . [START ON 09/21/2015] insulin aspart  0-24 Units Subcutaneous 6 times per day  . magnesium sulfate 1 - 4 g bolus IVPB  2 g Intravenous Once  . metoprolol tartrate  12.5 mg Oral BID   Or  . metoprolol tartrate  12.5 mg Per Tube BID  . [START ON 09/22/2015] pantoprazole  40 mg Oral Daily  . [START ON 09/21/2015] sodium chloride  3 mL Intravenous Q12H  . vancomycin  1,000 mg Intravenous Once   Continuous Infusions: . sodium chloride 20 mL/hr (09/20/15 1640)  . [START ON 09/21/2015] sodium chloride    . sodium chloride 20 mL/hr (09/20/15 1640)  . amiodarone 60 mg/hr (09/20/15 2103)   Followed by  . amiodarone    . dexmedetomidine Stopped (09/20/15 1835)  . DOPamine 2 mcg/kg/min (09/20/15 2045)  . lactated ringers    . lactated ringers 20 mL/hr (09/20/15 1640)  . nitroGLYCERIN Stopped (09/20/15 1640)  . phenylephrine (NEO-SYNEPHRINE) Adult infusion Stopped (09/20/15 1905)  PRN Meds:.sodium chloride, albumin human, lactated ringers, metoprolol, midazolam, morphine injection, morphine injection, ondansetron (ZOFRAN) IV, oxyCODONE, [START ON 09/21/2015] sodium chloride, traMADol  General appearance: alert and cooperative Neurologic: intact Heart: RRR, no murmur, rub with CTs in place Lungs: Slightly diminished bilaterally Extremities: Trace LE edema Wound: Aquacel intact  Lab Results: CBC: Recent Labs  09/18/15 1148  09/20/15 1406  09/20/15 1646 09/20/15 1649  WBC 6.2  --   --   --   --  14.0*  HGB 15.9  < > 11.2*  < > 12.6* 12.9*  HCT 46.4  < > 33.3*  < > 37.0* 36.8*  PLT 241  --  122*  --   --  95*  < > = values in this interval not displayed. BMET:  Recent Labs  09/18/15 1148  09/20/15 1405 09/20/15 1525 09/20/15 1646  NA 138  < > 137 138 140  K 4.4  < > 4.8 3.9 4.1  CL 107  < > 105  104  --   CO2 22  --   --   --   --   GLUCOSE 79  < > 123* 154* 104*  BUN 13  < > 15 14  --   CREATININE 0.94  < > 0.70 0.70  --   CALCIUM 9.5  --   --   --   --   < > = values in this interval not displayed.  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 EXAM: PORTABLE CHEST 1 VIEW COMPARISON:  Preoperative chest x-ray 09/18/2015 and earlier. CTA chest 09/14/2015. FINDINGS: Endotracheal tube tip below the thoracic inlet projecting approximately 9 cm above the carina. Swan-Ganz catheter tip projects over the distal main pulmonary trunk. Nasogastric tube courses below the diaphragm. Mediastinal drainage tube. Sternotomy for aortic valve replacement and ascending thoracic aortic repair. No evidence of mediastinal hematoma. Cardiac silhouette upper normal in size to slightly enlarged, unchanged. Lungs clear without significant atelectasis. Pulmonary vascularity normal. No visible pleural effusions. IMPRESSION: 1. Support apparatus satisfactory. 2. No acute cardiopulmonary disease post aortic valve replacement and ascending thoracic aortic repair. Electronically Signed   By: Evangeline Dakin M.D.   On: 09/20/2015 17:35     Assessment/Plan: S/P Procedure(s) (LRB): BENTALL PROCEDURE (N/A) TRANSESOPHAGEAL ECHOCARDIOGRAM (TEE) (N/A) MAZE (N/A) CLIPPING OF ATRIAL APPENDAGE (N/A)  1.CV-Paced at 61. On Amiodarone and Dopamine drips. CO 4.4 and CI 2.2 2. Pulmonary-On 4 liters of oxygen via [place]. Chest tubes with 210 cc since surgery. 3. ABL anemia-H and H stable 12.9 and 36.8 4. Mild thrombocytopenia-platelets 95,000.  5. Continue routine post op care   Gerardine Peltz M PA-C 09/20/2015 9:21 PM

## 2015-09-20 NOTE — Procedures (Signed)
Extubation Procedure Note  Patient Details:   Name: JESE COMELLA DOB: 21-Oct-1951 MRN: [id number]   Airway Documentation:     Evaluation  O2 sats: stable throughout Complications: No apparent complications Patient did tolerate procedure well. Bilateral Breath Sounds: Clear   Yes   VC: 2.3 L/min NIF: -55 cm/H2O  Extubated to 4L [place].   Dulcy Fanny 09/20/2015, 8:32 PM

## 2015-09-20 NOTE — OR Nursing (Signed)
RN contacted 2S, providing a patient update.

## 2015-09-20 NOTE — Transfer of Care (Signed)
Immediate Anesthesia Transfer of Care Note  Patient: Chad Lawrence  Procedure(s) Performed: Procedure(s): BENTALL PROCEDURE (N/A) TRANSESOPHAGEAL ECHOCARDIOGRAM (TEE) (N/A) MAZE (N/A) CLIPPING OF ATRIAL APPENDAGE (N/A)  Patient Location: ICU  Anesthesia Type:General  Level of Consciousness: Patient remains intubated per anesthesia plan  Airway & Oxygen Therapy: Patient remains intubated per anesthesia plan and Patient placed on Ventilator (see vital sign flow sheet for setting)  Post-op Assessment: Report given to RN and Post -op Vital signs reviewed and stable  Post vital signs: Reviewed and stable  Last Vitals:  Filed Vitals:   09/20/15 0724  BP:   Pulse: 88  Temp:   Resp:     Complications: No apparent anesthesia complications

## 2015-09-20 NOTE — OR Nursing (Signed)
RN provided patient update, confirming plans for patient to transition to 2S Room 3 postoperatively.

## 2015-09-21 ENCOUNTER — Encounter (HOSPITAL_COMMUNITY): Payer: Self-pay | Admitting: Cardiothoracic Surgery

## 2015-09-21 ENCOUNTER — Inpatient Hospital Stay (HOSPITAL_COMMUNITY): Payer: 59

## 2015-09-21 LAB — BASIC METABOLIC PANEL
ANION GAP: 9 (ref 5–15)
Anion gap: 11 (ref 5–15)
BUN: 10 mg/dL (ref 6–20)
BUN: 10 mg/dL (ref 6–20)
CALCIUM: 8 mg/dL — AB (ref 8.9–10.3)
CO2: 21 mmol/L — ABNORMAL LOW (ref 22–32)
CO2: 24 mmol/L (ref 22–32)
Calcium: 8.4 mg/dL — ABNORMAL LOW (ref 8.9–10.3)
Chloride: 107 mmol/L (ref 101–111)
Chloride: 98 mmol/L — ABNORMAL LOW (ref 101–111)
Creatinine, Ser: 0.93 mg/dL (ref 0.61–1.24)
Creatinine, Ser: 0.94 mg/dL (ref 0.61–1.24)
GFR calc Af Amer: >60 mL/min (ref >60)
GFR calc Af Amer: >60 mL/min (ref >60)
GFR calc non Af Amer: >60 mL/min (ref >60)
Glucose, Bld: 167 mg/dL — ABNORMAL HIGH (ref 65–99)
Glucose, Bld: 175 mg/dL — ABNORMAL HIGH (ref 65–99)
POTASSIUM: 4 mmol/L (ref 3.5–5.1)
Potassium: 3.9 mmol/L (ref 3.5–5.1)
SODIUM: 137 mmol/L (ref 135–145)
Sodium: 133 mmol/L — ABNORMAL LOW (ref 135–145)

## 2015-09-21 LAB — GLUCOSE, CAPILLARY
Glucose-Capillary: 118 mg/dL — ABNORMAL HIGH (ref 65–99)
Glucose-Capillary: 136 mg/dL — ABNORMAL HIGH (ref 65–99)
Glucose-Capillary: 138 mg/dL — ABNORMAL HIGH (ref 65–99)
Glucose-Capillary: 138 mg/dL — ABNORMAL HIGH (ref 65–99)
Glucose-Capillary: 138 mg/dL — ABNORMAL HIGH (ref 65–99)
Glucose-Capillary: 139 mg/dL — ABNORMAL HIGH (ref 65–99)
Glucose-Capillary: 143 mg/dL — ABNORMAL HIGH (ref 65–99)
Glucose-Capillary: 162 mg/dL — ABNORMAL HIGH (ref 65–99)
Glucose-Capillary: 96 mg/dL (ref 65–99)

## 2015-09-21 LAB — CBC
HCT: 31.6 % — ABNORMAL LOW (ref 39.0–52.0)
HEMATOCRIT: 32.3 % — AB (ref 39.0–52.0)
HEMOGLOBIN: 10.9 g/dL — AB (ref 13.0–17.0)
Hemoglobin: 10.7 g/dL — ABNORMAL LOW (ref 13.0–17.0)
MCH: 31 pg (ref 26.0–34.0)
MCH: 31 pg (ref 26.0–34.0)
MCHC: 33.9 g/dL (ref 30.0–36.0)
MCHC: 33.7 g/dL (ref 30.0–36.0)
MCV: 91.6 fL (ref 78.0–100.0)
MCV: 91.8 fL (ref 78.0–100.0)
PLATELETS: 83 10*3/uL — AB (ref 150–400)
PLATELETS: 96 10*3/uL — AB (ref 150–400)
RBC: 3.45 MIL/uL — AB (ref 4.22–5.81)
RBC: 3.52 MIL/uL — AB (ref 4.22–5.81)
RDW: 13.1 % (ref 11.5–15.5)
RDW: 13.4 % (ref 11.5–15.5)
WBC: 9.7 10*3/uL (ref 4.0–10.5)
WBC: 14.3 10*3/uL — AB (ref 4.0–10.5)

## 2015-09-21 LAB — CREATININE, SERUM
Creatinine, Ser: 1 mg/dL (ref 0.61–1.24)
GFR calc non Af Amer: >60 mL/min (ref >60)

## 2015-09-21 LAB — MAGNESIUM
MAGNESIUM: 2.1 mg/dL (ref 1.7–2.4)
Magnesium: 2 mg/dL (ref 1.7–2.4)

## 2015-09-21 MED ORDER — POTASSIUM CHLORIDE CRYS ER 20 MEQ PO TBCR
20.0000 meq | EXTENDED_RELEASE_TABLET | Freq: Every day | ORAL | Status: DC
  Administered 2015-09-21: 20 meq via ORAL
  Filled 2015-09-21 (×2): qty 1

## 2015-09-21 MED ORDER — FUROSEMIDE 40 MG PO TABS
40.0000 mg | ORAL_TABLET | Freq: Every day | ORAL | Status: DC
  Administered 2015-09-21: 40 mg via ORAL
  Filled 2015-09-21 (×2): qty 1

## 2015-09-21 MED ORDER — PNEUMOCOCCAL VAC POLYVALENT 25 MCG/0.5ML IJ INJ: 0.5000 mL | INJECTION | INTRAMUSCULAR | Status: DC | PRN

## 2015-09-21 MED ORDER — INSULIN ASPART 100 UNIT/ML ~~LOC~~ SOLN
0.0000 [IU] | SUBCUTANEOUS | Status: DC
  Administered 2015-09-21 – 2015-09-22 (×2): 2 [IU] via SUBCUTANEOUS

## 2015-09-21 MED FILL — Electrolyte-R (PH 7.4) Solution: INTRAVENOUS | Qty: 4000 | Status: AC

## 2015-09-21 MED FILL — Heparin Sodium (Porcine) Inj 1000 Unit/ML: INTRAMUSCULAR | Qty: 10 | Status: AC

## 2015-09-21 MED FILL — Lidocaine HCl IV Inj 20 MG/ML: INTRAVENOUS | Qty: 5 | Status: AC

## 2015-09-21 MED FILL — Mannitol IV Soln 20%: INTRAVENOUS | Qty: 500 | Status: AC

## 2015-09-21 MED FILL — Sodium Bicarbonate IV Soln 8.4%: INTRAVENOUS | Qty: 50 | Status: AC

## 2015-09-21 MED FILL — Sodium Chloride IV Soln 0.9%: INTRAVENOUS | Qty: 3000 | Status: AC

## 2015-09-21 NOTE — Progress Notes (Addendum)
TCTS DAILY ICU PROGRESS NOTE                   [address]            [place] [address]          [phone]   1 Day Post-Op Procedure(s) (LRB): BENTALL PROCEDURE (N/A) TRANSESOPHAGEAL ECHOCARDIOGRAM (TEE) (N/A) MAZE (N/A) CLIPPING OF ATRIAL APPENDAGE (N/A)  Total Length of Stay:  LOS: 1 day   Subjective:  Mr. Chad Lawrence states he feels pretty good.  He has no complaints.  Objective: Vital signs in last 24 hours: Temp:  [97.2 F (36.2 C)-98.4 F (36.9 C)] 98.4 F (36.9 C) (11/03 0700) Pulse Rate:  [89-90] 90 (11/03 0700) Cardiac Rhythm:  [-] Atrial paced (11/03 0500) Resp:  [11-23] 16 (11/03 0700) BP: (69-113)/(53-78) 107/65 mmHg (11/03 0700) SpO2:  [95 %-100 %] 100 % (11/03 0700) Arterial Line BP: (88-123)/(54-67) 123/55 mmHg (11/03 0700) FiO2 (%):  [40 %-50 %] 40 % (11/02 1952) Weight:  [191 lb 5.8 oz (86.8 kg)] 191 lb 5.8 oz (86.8 kg) (11/03 0500)  Filed Weights   09/20/15 0657 09/21/15 0500  Weight: 182 lb 6.4 oz (82.736 kg) 191 lb 5.8 oz (86.8 kg)    Weight change: 8 lb 15.4 oz (4.064 kg)   Hemodynamic parameters for last 24 hours: PAP: (17-25)/(8-17) 17/8 mmHg CO:  [3.3 L/min-5.3 L/min] 5.3 L/min CI:  [1.6 L/min/m2-2.6 L/min/m2] 2.6 L/min/m2  Intake/Output from previous day: 11/02 0701 - 11/03 0700 In: 6115.5 [I.V.:3585.5; Blood:1150; NG/GT:30; IV Piggyback:1350] Out: 7619 [Urine:4050; Emesis/NG output:30; Blood:400; Chest Tube:450]  Current Meds: Scheduled Meds: . acetaminophen  1,000 mg Oral 4 times per day   Or  . acetaminophen (TYLENOL) oral liquid 160 mg/5 mL  1,000 mg Per Tube 4 times per day  . amiodarone  400 mg Oral Q12H   Followed by  . [START ON 09/28/2015] amiodarone  400 mg Oral Daily  . antiseptic oral rinse  7 mL Mouth Rinse BID  . aspirin EC  325 mg Oral Daily   Or  . aspirin  324 mg Per Tube Daily  . bisacodyl  10 mg Oral Daily   Or  . bisacodyl  10 mg Rectal Daily  . cefUROXime (ZINACEF)  IV  1.5 g  Intravenous Q12H  . docusate sodium  200 mg Oral Daily  . famotidine (PEPCID) IV  20 mg Intravenous Q12H  . insulin aspart  0-24 Units Subcutaneous 6 times per day  . insulin aspart  0-24 Units Subcutaneous 6 times per day  . metoprolol tartrate  12.5 mg Oral BID   Or  . metoprolol tartrate  12.5 mg Per Tube BID  . [START ON 09/22/2015] pantoprazole  40 mg Oral Daily  . sodium chloride  3 mL Intravenous Q12H   Continuous Infusions: . sodium chloride 20 mL/hr (09/20/15 1640)  . sodium chloride    . sodium chloride 20 mL/hr (09/20/15 1640)  . dexmedetomidine Stopped (09/20/15 1835)  . lactated ringers    . lactated ringers 20 mL/hr (09/20/15 1640)   PRN Meds:.sodium chloride, lactated ringers, metoprolol, midazolam, morphine injection, ondansetron (ZOFRAN) IV, oxyCODONE, pneumococcal 23 valent vaccine, sodium chloride, traMADol  General appearance: alert, cooperative and no distress Heart: regular rate and rhythm Lungs: clear to auscultation bilaterally Abdomen: soft, non-tender; bowel sounds normal; no masses,  no organomegaly Extremities: edema minimal Wound: clean and dry  Lab Results: CBC: Recent Labs  09/20/15 2215 09/21/15 0500  WBC 11.3* 9.7  HGB 11.3* 10.7*  HCT 32.6* 31.6*  PLT 79* 83*   BMET:  Recent Labs  09/18/15 1148  09/20/15 2153 09/20/15 2215 09/21/15 0500  NA 138  < > 139  --  137  K 4.4  < > 4.0  --  4.0  CL 107  < > 106  --  107  CO2 22  --   --   --  21*  GLUCOSE 79  < > 153*  --  167*  BUN 13  < > 11  --  10  CREATININE 0.94  < > 0.80 0.94 0.93  CALCIUM 9.5  --   --   --  8.0*  < > = values in this interval not displayed.  PT/INR:  Recent Labs  09/20/15 1649  LABPROT 21.3*  INR 1.85*   Radiology: Dg Chest Port 1 View  09/21/2015  CLINICAL DATA:  BENTALL PROCEDURE EXAM: PORTABLE CHEST 1 VIEW COMPARISON:  09/20/2015 FINDINGS: There is a Swan-Ganz catheter with tip in the right ventricular outflow tract. The heart size appears normal. No  pleural effusion or pneumothorax. Atelectasis is identified within the right midlung and left base. IMPRESSION: Bilateral platelike atelectasis. Electronically Signed   By: Kerby Moors M.D.   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 EXAM: PORTABLE CHEST 1 VIEW COMPARISON:  Preoperative chest x-ray 09/18/2015 and earlier. CTA chest 09/14/2015. FINDINGS: Endotracheal tube tip below the thoracic inlet projecting approximately 9 cm above the carina. Swan-Ganz catheter tip projects over the distal main pulmonary trunk. Nasogastric tube courses below the diaphragm. Mediastinal drainage tube. Sternotomy for aortic valve replacement and ascending thoracic aortic repair. No evidence of mediastinal hematoma. Cardiac silhouette upper normal in size to slightly enlarged, unchanged. Lungs clear without significant atelectasis. Pulmonary vascularity normal. No visible pleural effusions. IMPRESSION: 1. Support apparatus satisfactory. 2. No acute cardiopulmonary disease post aortic valve replacement and ascending thoracic aortic repair. Electronically Signed   By: Evangeline Dakin M.D.   On: 09/20/2015 17:35     Assessment/Plan: S/P Procedure(s) (LRB): BENTALL PROCEDURE (N/A) TRANSESOPHAGEAL ECHOCARDIOGRAM (TEE) (N/A) MAZE (N/A) CLIPPING OF ATRIAL APPENDAGE (N/A)  1. CV- Hemodynamically stable, off all drips- Bradycardic under pacer with rates in the 40s- continue pacer for now, hold beta blocker 2. Pulm- no on oxygen, no acute issues, CXR shows some bilateral atelectasis, good use of IS 3. Renal- creatinine WNL, mild hypervolemia- will start low dose Lasix 4. Expected post operative blood loss anemia- mild Hgb at 10.7 5. CBGs- well controlled, off insulin drip, not a diabetic, will hold off on Levemir, continue SSIP 6. Dispo- patient doing very well, off all drips, Bradycardia under pacer... Hold beta  blocker, will discuss coumadin for post operative A. Fib prophylaxis post MAZE, possibly transfer to step down unit today     BARRETT, [place] 09/21/2015 7:54 AM  Thrombocytopenia - avoid heparin exposure Plan d/c home on coumadin because of history of  afib Now on  Cordarone Underlying, ? Nodal  a paced 80 now No blood transfusion given   I have seen and examined Derek Jack and agree with the above assessment  and plan.  [name] MD Beeper [phone] Office [phone] 09/21/2015 8:29 AM

## 2015-09-21 NOTE — Progress Notes (Signed)
EVENING ROUNDS NOTE :     [address]       [place] [address]             [phone]                 1 Day Post-Op Procedure(s) (LRB): BENTALL PROCEDURE (N/A) TRANSESOPHAGEAL ECHOCARDIOGRAM (TEE) (N/A) MAZE (N/A) CLIPPING OF ATRIAL APPENDAGE (N/A)  Total Length of Stay:  LOS: 1 day  BP 130/77 mmHg  Pulse 85  Temp(Src) 97.8 F (36.6 C) (Oral)  Resp 25  Ht 6' (1.829 m)  Wt 191 lb 5.8 oz (86.8 kg)  BMI 25.95 kg/m2  SpO2 97%  .Intake/Output      11/03 0701 - 11/04 0700   P.O. 180   I.V. (mL/kg) 140 (1.6)   Blood    NG/GT    IV Piggyback    Total Intake(mL/kg) 320 (3.7)   Urine (mL/kg/hr) 385 (0.4)   Emesis/NG output    Blood    Chest Tube 30 (0)   Total Output 415   Net -95         . sodium chloride 20 mL/hr (09/20/15 1640)  . sodium chloride    . sodium chloride 20 mL/hr (09/20/15 1640)  . dexmedetomidine Stopped (09/20/15 1835)  . lactated ringers    . lactated ringers 20 mL/hr (09/20/15 1640)     Lab Results  Component Value Date   WBC 14.3* 09/21/2015   HGB 10.9* 09/21/2015   HCT 32.3* 09/21/2015   PLT 96* 09/21/2015   GLUCOSE 167* 09/21/2015   ALT 26 09/18/2015   AST 21 09/18/2015   NA 137 09/21/2015   K 4.0 09/21/2015   CL 107 09/21/2015   CREATININE 1.00 09/21/2015   BUN 10 09/21/2015   CO2 21* 09/21/2015   INR 1.85* 09/20/2015   HGBA1C 5.8* 09/18/2015   Stable day, walked around unit, holding sinus   [name] MD  Beeper [phone] Office [phone] 09/21/2015 7:35 PM

## 2015-09-21 NOTE — Progress Notes (Signed)
CTS PM Rounds  Extubated with stable hemodynamics Min chest tube output Doing well

## 2015-09-22 ENCOUNTER — Inpatient Hospital Stay (HOSPITAL_COMMUNITY): Payer: 59

## 2015-09-22 DIAGNOSIS — Z95828 Presence of other vascular implants and grafts: Secondary | ICD-10-CM

## 2015-09-22 DIAGNOSIS — Z952 Presence of prosthetic heart valve: Secondary | ICD-10-CM

## 2015-09-22 LAB — GLUCOSE, CAPILLARY
Glucose-Capillary: 123 mg/dL — ABNORMAL HIGH (ref 65–99)
Glucose-Capillary: 149 mg/dL — ABNORMAL HIGH (ref 65–99)
Glucose-Capillary: 128 mg/dL — ABNORMAL HIGH (ref 65–99)
Glucose-Capillary: 157 mg/dL — ABNORMAL HIGH (ref 65–99)

## 2015-09-22 LAB — CBC
HCT: 30.8 % — ABNORMAL LOW (ref 39.0–52.0)
Hemoglobin: 10.6 g/dL — ABNORMAL LOW (ref 13.0–17.0)
MCH: 32.1 pg (ref 26.0–34.0)
MCHC: 34.4 g/dL (ref 30.0–36.0)
MCV: 93.3 fL (ref 78.0–100.0)
PLATELETS: 91 10*3/uL — AB (ref 150–400)
RBC: 3.3 MIL/uL — AB (ref 4.22–5.81)
RDW: 13.4 % (ref 11.5–15.5)
WBC: 14.2 10*3/uL — AB (ref 4.0–10.5)

## 2015-09-22 LAB — BASIC METABOLIC PANEL
Anion gap: 8 (ref 5–15)
BUN: 9 mg/dL (ref 6–20)
CALCIUM: 8.3 mg/dL — AB (ref 8.9–10.3)
CO2: 26 mmol/L (ref 22–32)
CREATININE: 0.91 mg/dL (ref 0.61–1.24)
Chloride: 102 mmol/L (ref 101–111)
GFR calc non Af Amer: >60 mL/min (ref >60)
Glucose, Bld: 136 mg/dL — ABNORMAL HIGH (ref 65–99)
Potassium: 3.8 mmol/L (ref 3.5–5.1)
SODIUM: 136 mmol/L (ref 135–145)

## 2015-09-22 LAB — PROTIME-INR
INR: 1.47 (ref 0.00–1.49)
Prothrombin Time: 17.9 seconds — ABNORMAL HIGH (ref 11.6–15.2)

## 2015-09-22 MED ORDER — INSULIN ASPART 100 UNIT/ML ~~LOC~~ SOLN
0.0000 [IU] | Freq: Three times a day (TID) | SUBCUTANEOUS | Status: DC
  Administered 2015-09-22 – 2015-09-23 (×5): 2 [IU] via SUBCUTANEOUS

## 2015-09-22 MED ORDER — DOCUSATE SODIUM 100 MG PO CAPS
200.0000 mg | ORAL_CAPSULE | Freq: Every day | ORAL | Status: DC
  Administered 2015-09-22 – 2015-09-26 (×5): 200 mg via ORAL
  Filled 2015-09-22 (×5): qty 2

## 2015-09-22 MED ORDER — FUROSEMIDE 10 MG/ML IJ SOLN
40.0000 mg | Freq: Once | INTRAMUSCULAR | Status: AC
  Administered 2015-09-22: 40 mg via INTRAVENOUS
  Filled 2015-09-22: qty 4

## 2015-09-22 MED ORDER — ASPIRIN EC 325 MG PO TBEC
325.0000 mg | DELAYED_RELEASE_TABLET | Freq: Every day | ORAL | Status: DC
  Administered 2015-09-22 – 2015-09-23 (×2): 325 mg via ORAL
  Filled 2015-09-22: qty 1

## 2015-09-22 MED ORDER — WARFARIN SODIUM 2.5 MG PO TABS
2.5000 mg | ORAL_TABLET | Freq: Once | ORAL | Status: AC
  Administered 2015-09-22: 2.5 mg via ORAL
  Filled 2015-09-22: qty 1

## 2015-09-22 MED ORDER — MOVING RIGHT ALONG BOOK
Freq: Once | Status: AC
Start: 2015-09-22 — End: 2015-09-22
  Administered 2015-09-22: 10:00:00
  Filled 2015-09-22: qty 1

## 2015-09-22 MED ORDER — METOPROLOL TARTRATE 12.5 MG HALF TABLET
12.5000 mg | ORAL_TABLET | Freq: Two times a day (BID) | ORAL | Status: DC
  Administered 2015-09-22 – 2015-09-23 (×4): 12.5 mg via ORAL
  Filled 2015-09-22 (×4): qty 1

## 2015-09-22 MED ORDER — ONDANSETRON HCL 4 MG/2ML IJ SOLN
4.0000 mg | Freq: Four times a day (QID) | INTRAMUSCULAR | Status: DC | PRN
  Administered 2015-09-22 (×2): 4 mg via INTRAVENOUS
  Filled 2015-09-22 (×3): qty 2

## 2015-09-22 MED ORDER — SODIUM CHLORIDE 0.9 % IJ SOLN
3.0000 mL | Freq: Two times a day (BID) | INTRAMUSCULAR | Status: DC
  Administered 2015-09-22 – 2015-09-26 (×7): 3 mL via INTRAVENOUS

## 2015-09-22 MED ORDER — PANTOPRAZOLE SODIUM 40 MG PO TBEC
40.0000 mg | DELAYED_RELEASE_TABLET | Freq: Every day | ORAL | Status: DC
  Administered 2015-09-22 – 2015-09-26 (×5): 40 mg via ORAL
  Filled 2015-09-22 (×5): qty 1

## 2015-09-22 MED ORDER — WARFARIN - PHYSICIAN DOSING INPATIENT
Freq: Every day | Status: DC
  Administered 2015-09-23 – 2015-09-25 (×2)

## 2015-09-22 MED ORDER — BISACODYL 10 MG RE SUPP: 10.0000 mg | Freq: Every day | RECTAL | Status: DC | PRN

## 2015-09-22 MED ORDER — CALCIUM CHLORIDE 10 % IV SOLN
1.0000 g | Freq: Once | INTRAVENOUS | Status: AC
  Administered 2015-09-22: 1 g via INTRAVENOUS
  Filled 2015-09-22: qty 10

## 2015-09-22 MED ORDER — ALBUMIN HUMAN 5 % IV SOLN
12.5000 g | Freq: Once | INTRAVENOUS | Status: AC
  Administered 2015-09-22: 12.5 g via INTRAVENOUS
  Filled 2015-09-22: qty 250

## 2015-09-22 MED ORDER — BISACODYL 5 MG PO TBEC
10.0000 mg | DELAYED_RELEASE_TABLET | Freq: Every day | ORAL | Status: DC | PRN
  Administered 2015-09-24: 10 mg via ORAL
  Filled 2015-09-22: qty 2

## 2015-09-22 MED ORDER — TRAMADOL HCL 50 MG PO TABS
50.0000 mg | ORAL_TABLET | ORAL | Status: DC | PRN
  Administered 2015-09-22 – 2015-09-23 (×3): 50 mg via ORAL
  Filled 2015-09-22 (×3): qty 1

## 2015-09-22 MED ORDER — SODIUM CHLORIDE 0.9 % IJ SOLN: 3.0000 mL | INTRAMUSCULAR | Status: DC | PRN

## 2015-09-22 MED ORDER — ONDANSETRON HCL 4 MG PO TABS: 4.0000 mg | ORAL_TABLET | Freq: Four times a day (QID) | ORAL | Status: DC | PRN

## 2015-09-22 MED ORDER — METOCLOPRAMIDE HCL 5 MG/ML IJ SOLN
10.0000 mg | Freq: Four times a day (QID) | INTRAMUSCULAR | Status: DC
  Administered 2015-09-22 – 2015-09-26 (×13): 10 mg via INTRAVENOUS
  Filled 2015-09-22 (×16): qty 2

## 2015-09-22 MED ORDER — SODIUM CHLORIDE 0.9 % IV SOLN: 250.0000 mL | INTRAVENOUS | Status: DC | PRN

## 2015-09-22 MED ORDER — OXYCODONE HCL 5 MG PO TABS
5.0000 mg | ORAL_TABLET | ORAL | Status: DC | PRN
  Administered 2015-09-22: 5 mg via ORAL
  Administered 2015-09-23: 10 mg via ORAL
  Filled 2015-09-22: qty 2
  Filled 2015-09-22: qty 1

## 2015-09-22 NOTE — Progress Notes (Signed)
Patient ID: Chad Lawrence, male   DOB: 1951-05-14, 64 y.o.   MRN: [id number] TCTS DAILY ICU PROGRESS NOTE                   [address]            ,[place] [address]          [phone]   2 Days Post-Op Procedure(s) (LRB): BENTALL PROCEDURE (N/A) TRANSESOPHAGEAL ECHOCARDIOGRAM (TEE) (N/A) MAZE (N/A) CLIPPING OF ATRIAL APPENDAGE (N/A)  Total Length of Stay:  LOS: 2 days   Subjective: Alert awake , neuro intact  Objective: Vital signs in last 24 hours: Temp:  [97.8 F (36.6 C)-99.1 F (37.3 C)] 99.1 F (37.3 C) (11/04 0400) Pulse Rate:  [78-90] 78 (11/04 0600) Cardiac Rhythm:  [-] Atrial paced (11/04 0400) Resp:  [10-26] 19 (11/04 0600) BP: (103-144)/(60-82) 144/77 mmHg (11/04 0600) SpO2:  [95 %-100 %] 97 % (11/04 0600) Arterial Line BP: (130-170)/(52-75) 168/69 mmHg (11/03 1030) Weight:  [191 lb 9.6 oz (86.909 kg)] 191 lb 9.6 oz (86.909 kg) (11/04 0500)  Filed Weights   09/20/15 0657 09/21/15 0500 09/22/15 0500  Weight: 182 lb 6.4 oz (82.736 kg) 191 lb 5.8 oz (86.8 kg) 191 lb 9.6 oz (86.909 kg)    Weight change: 3.9 oz (0.109 kg)   Hemodynamic parameters for last 24 hours: PAP: (16-32)/(8-14) 24/11 mmHg CO:  [6.2 L/min] 6.2 L/min CI:  [3 L/min/m2] 3 L/min/m2  Intake/Output from previous day: 11/03 0701 - 11/04 0700 In: 820 [P.O.:180; I.V.:540; IV Piggyback:100] Out: 1260 [Urine:1230; Chest Tube:30]  Intake/Output this shift:    Current Meds: Scheduled Meds: . acetaminophen  1,000 mg Oral 4 times per day   Or  . acetaminophen (TYLENOL) oral liquid 160 mg/5 mL  1,000 mg Per Tube 4 times per day  . amiodarone  400 mg Oral Q12H   Followed by  . [START ON 09/28/2015] amiodarone  400 mg Oral Daily  . antiseptic oral rinse  7 mL Mouth Rinse BID  . aspirin EC  325 mg Oral Daily   Or  . aspirin  324 mg Per Tube Daily  . bisacodyl  10 mg Oral Daily   Or  . bisacodyl  10 mg Rectal Daily  . cefUROXime (ZINACEF)  IV  1.5 g Intravenous Q12H   . docusate sodium  200 mg Oral Daily  . furosemide  40 mg Oral Daily  . insulin aspart  0-24 Units Subcutaneous 6 times per day  . metoprolol tartrate  12.5 mg Oral BID   Or  . metoprolol tartrate  12.5 mg Per Tube BID  . pantoprazole  40 mg Oral Daily  . potassium chloride  20 mEq Oral Daily  . sodium chloride  3 mL Intravenous Q12H   Continuous Infusions: . sodium chloride 20 mL/hr (09/20/15 1640)  . sodium chloride    . sodium chloride 20 mL/hr (09/20/15 1640)  . dexmedetomidine Stopped (09/20/15 1835)  . lactated ringers    . lactated ringers 20 mL/hr (09/20/15 1640)   PRN Meds:.sodium chloride, lactated ringers, metoprolol, midazolam, morphine injection, ondansetron (ZOFRAN) IV, oxyCODONE, pneumococcal 23 valent vaccine, sodium chloride, traMADol  General appearance: alert and cooperative Neurologic: intact Heart: regular rate and rhythm, S1, S2 normal, no murmur, click, rub or gallop Lungs: clear to auscultation bilaterally Abdomen: soft, non-tender; bowel sounds normal; no masses,  no organomegaly Extremities: extremities normal, atraumatic, no cyanosis or edema and Homans sign is negative, no sign of DVT  Wound: sternum stable  Lab Results: CBC: Recent Labs  09/21/15 1700 09/22/15 0336  WBC 14.3* 14.2*  HGB 10.9* 10.6*  HCT 32.3* 30.8*  PLT 96* 91*   BMET:  Recent Labs  09/21/15 1700 09/22/15 0336  NA 133* 136  K 3.9 3.8  CL 98* 102  CO2 24 26  GLUCOSE 175* 136*  BUN 10 9  CREATININE 0.94  1.00 0.91  CALCIUM 8.4* 8.3*    PT/INR:  Recent Labs  09/22/15 0336  LABPROT 17.9*  INR 1.47   Radiology: Dg Chest Port 1 View  09/22/2015  CLINICAL DATA:  Status post Bentall procedure. EXAM: PORTABLE CHEST 1 VIEW COMPARISON:  Portable chest x-ray of September 21, 2015 FINDINGS: The lungs are adequately inflated. Slight interval increased density at the right and left lung bases is consistent with subsegmental atelectasis. There is no pleural effusion or  pneumothorax. The cardiac silhouette is enlarged. The left atrial appendage clip and the aortic valve ring are demonstrated. The Swan-Ganz catheter is been removed. The right internal jugular Cordis sheath remains. There are 7 intact sternal wires. IMPRESSION: Increased bibasilar subsegmental atelectasis. Minimal central pulmonary vascular congestion. There is no significant pleural effusion and no pneumothorax. Electronically Signed   By: [name] M.D.   On: 09/22/2015 07:23     Assessment/Plan: S/P Procedure(s) (LRB): BENTALL PROCEDURE (N/A) TRANSESOPHAGEAL ECHOCARDIOGRAM (TEE) (N/A) MAZE (N/A) CLIPPING OF ATRIAL APPENDAGE (N/A) Mobilize Diuresis Plan for transfer to step-down: see transfer orders Holding sinus , wait on starting coumadin    [name] 09/22/2015 7:48 AM

## 2015-09-22 NOTE — Discharge Summary (Signed)
Physician Discharge Summary  Patient ID: Chad Lawrence MRN: [id number] DOB/AGE: 66/66/1952 66 y.o.  Admit date: 09/20/2015 Discharge date: 09/26/2015  Admission Diagnoses:  Patient Active Problem List   Diagnosis Date Noted  . S/P AVR 09/22/2015  . S/P ascending aortic replacement 09/22/2015  . Aortic insufficiency 09/20/2015  . 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 S/P AVR 09/22/2015  . S/P ascending aortic replacement 09/22/2015  . Aortic insufficiency 09/20/2015  . 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  The patient had been in his normal state of health until this past summer.  He noticed that was developed episodes of dizziness without syncope.  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  He was referred to Dr. Einar Gip for workup who recommended the patient undergo surgical consultation for intervention on his Aortic Valve and dilated Aorta.  He was evaluated by Dr. Servando Snare on 09/15/2015 at which time he was in agreement the patient would benefit from surgical intervention.  The risks and benefits of the procedure were explained to the patient and he was agreeable to proceed.     Hospital Course:   Chad Lawrence presented to [facility] on 09/20/2015.  He was taken to the operating room and underwent Bentall Procedure with a 25 mm Pericardial Tissue Valve, Clipping of his Left Atrial Appendage, and Bipolar MAZE procedure.  He tolerated the procedure and was taken to the SICU in stable condition.  He was extubated the evening of surgery.  During his stay in the SICU the patient was weaned off Amiodarone and  Dopamine drips as tolerated.  His chest tubes and arterial lines were removed without difficulty.  He experienced episodes of Atrial Fibrillation which was treated with Lopressor and oral Amiodarone.  However, despite this the patient continued into sustained Atrial Fibrillation.  He was started on Digoxin to help with rate control. Because his heart rate was still in the 120's in a fib, after discussion with Dr. Einar Gip, he was put on a Cardizem drip. He remained in a fib but was rate controlled. He was restarted on Coumadin for stroke prophylaxis post MAZE procedure.  The patient was otherwise medically stable.  He was ambulating in the ICU without difficulty and felt medically stable for transfer to the step down unit in stable condition.  The patient continues to make progress.  He remains in atrial fibrillation on Amiodarone, Digoxin, Cardizem, and Lopressor.  He remains on low dose Coumadin with his most recent INR being 2.46.  His pacing wires were removed without difficulty.  He continues to ambulate without issue.  He is tolerating a regular diet.  He is felt medically stable for discharge home today.  Treatments: surgery:   Biologic replacement of aortic root and aortic valve with biologic Bentall with a 30 mm Gelweave Valsalva graft and [employer] pericardial tissue valve 25 mm model 3300 TFX serial #[id number] with reimplantation of right and left coronary arteries and left-sided Maze and placement of atrial clip.  Disposition: 01-Home or Self Care   Discharge Medications:   Medication List    STOP taking these medications        ELIQUIS 5  MG Tabs tablet  Generic drug:  apixaban     metoprolol succinate 25 MG 24 hr tablet  Commonly known as:  TOPROL-XL      TAKE these medications        amiodarone 200 MG tablet  Commonly known as:  PACERONE  Take 1 tablet (200 mg total) by mouth 2 (two) times daily. For one week;then take Amiodarone 200 mg daily by mouth thereafter      aspirin EC 81 MG tablet  Take 81 mg by mouth every evening.     clotrimazole-betamethasone cream  Commonly known as:  LOTRISONE  Apply 1 application topically daily as needed (Lawrence).     digoxin 0.125 MG tablet  Commonly known as:  LANOXIN  Take 1 tablet (0.125 mg total) by mouth daily.     diltiazem 120 MG 24 hr capsule  Commonly known as:  CARDIZEM CD  Take 1 capsule (120 mg total) by mouth daily.     furosemide 40 MG tablet  Commonly known as:  LASIX  Take 1 tablet (40 mg total) by mouth daily. For one week then stop.     metoprolol tartrate 25 MG tablet  Commonly known as:  LOPRESSOR  Take 1 tablet (25 mg total) by mouth 2 (two) times daily.     oxyCODONE 5 MG immediate release tablet  Commonly known as:  Oxy IR/ROXICODONE  Take 1-2 tablets (5-10 mg total) by mouth every 4 (four) hours as needed for severe pain.     Potassium Chloride ER 20 MEQ Tbcr  Take 20 mEq by mouth daily. For one week then stop.     warfarin 1 MG tablet  Commonly known as:  COUMADIN  Take 1 tablet (1 mg total) by mouth daily at 6 PM. Or as directed by Dr. Einar Gip       The patient has been discharged on:   1.Beta Blocker:  Yes [ x  ]                              No   [   ]                              If No, reason:  2.Ace Inhibitor/ARB: Yes [   ]                                     No  [  x  ]                                     If No, reason:  3.Statin:   Yes [   ]                  No  [ x  ]                  If No, reason: No CAD  4.S[place]a [place]:  Yes  [ x  ]                  No   [   ]                  If No, reason:  Discharge Instructions    Amb Referral to Cardiac Rehabilitation    Complete by:  As directed   Diagnosis:  Valve Replacement/Repair           Follow-up Information    Follow up with [name], MD on 10/26/2015 at 1:30 pm   Specialty:  Cardiothoracic Surgery   Why:  Office will contact you with appointment   Contact information:   [place] [address] [phone]       Follow up with [facility] on 10/26/2015 at 12:45 pm   Why:  Please get CXR 30 min prior to your appointment   Contact information:   [facility]       Follow up with Adrian Prows, MD.   Specialty:  Cardiology   Why:  Please contact office to set up follow up appointment for 1-2 weeks   Contact information:   [address] [place] [address] [phone]       Follow up with PT/INR.   Why:  Please contact Dr. Irven Shelling office for a PT/INR (as is on Coumadin) check on Friday 09/29/2015      Signed: Lars Pinks M  PA-C  09/26/2015, 1:59 PM

## 2015-09-22 NOTE — Anesthesia Postprocedure Evaluation (Signed)
  Anesthesia Post-op Note  Patient: Chad Lawrence  Procedure(s) Performed: Procedure(s): BENTALL PROCEDURE (N/A) TRANSESOPHAGEAL ECHOCARDIOGRAM (TEE) (N/A) MAZE (N/A) CLIPPING OF ATRIAL APPENDAGE (N/A)  Patient Location: ICU  Anesthesia Type:General  Level of Consciousness: sedated  Airway and Oxygen Therapy: Patient remains intubated per anesthesia plan  Post-op Pain: none  Post-op Assessment: Post-op Vital signs reviewed, Patient's Cardiovascular Status Stable, Respiratory Function Stable, Patent Airway, No signs of Nausea or vomiting and Pain level controlled              Post-op Vital Signs: Reviewed and stable  Last Vitals:  Filed Vitals:   09/22/15 1300  BP: 118/73  Pulse: 64  Temp:   Resp: 15    Complications: No apparent anesthesia complications

## 2015-09-22 NOTE — Op Note (Signed)
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  POSTOPERATIVE DIAGNOSES:  same  SURGICAL PROCEDURE:  Biologic replacement of aortic root and aortic valve with biologic Bentall with a 30 mm Gelweave Valsalva graft and [employer] pericardial tissue valve 25 mm model 3300 TFX serial #[id number] with reimplantation of right and left coronary arteries and left-sided Maze and placement of atrial clip.  SURGEON:  Lanelle Bal, MD  FIRST ASSISTANT:  Lars Pinks, PA  BRIEF HISTORY:  The patient is a 64 year old male, who had no previous cardiac history noted over the past several months some episodes of lightheadedness with exertion or heat.  This led to a referral to Cardiology because of the discovery of atrial fibrillation.  An echocardiogram revealed moderate-to-severe aortic insufficiency with a markedly dilated aortic root.  This was confirmed with CT scan that showed marked dilatation of his aortic root to 6.8 cm, but fairly quick mid ascending aorta of approximately 3 cm.  Cardiac catheterization showed a very short left main coronary artery, almost 2 separate ostium of the LAD and circumflex.  In addition, the patient had patent coronary arteries without significant coronary artery disease.  It was recommended to the patient to proceed with aortic root replacement and possible Maze procedure and atrial clip placement.  Risks and options were discussed with the patient, the use of mechanical versus tissue versus valve sparing.  Aortic root replacement was discussed with the patient.  He  preferred not to take Coumadin.  The patient agreed to the planned procedure and signed informed consent.  DESCRIPTION OF PROCEDURE:  With Swan-Ganz and arterial line monitors in place, the patient underwent general endotracheal anesthesia without incidence.  Skin of chest and legs were prepped with Betadine and draped in usual sterile manner.  Appropriate time-out was performed.  TEE probe was placed by Dr. Ermalene Postin and details are under separate note, but this confirmed severe aortic insufficiency with a markedly dilated aortic root.  There was no evidence of left atrial clot.  Appropriate time-out was performed and we proceeded with median sternotomy.  Pericardium was opened.  As suggested on CT scan, the patient had markedly dilated aortic root, which by the midlevel of the pulmonary artery had tapered off quickly to relatively normal size.  This allowed plenty of room for aortic cross-clamp and cannulation of the ascending aorta.  The patient was systemically heparinized.  The ascending aorta was cannulated. Because of the large size of the aortic root and its inherence to the right atrium, we proceeded with bicaval cannulation with right angled metal tip cannula.  Retrograde cardioplegia catheter was also placed. The patient was placed on cardiopulmonary bypass 2.4 L/min/m2.  We then dissected around the pulmonary veins.  A right superior pulmonary vein vent was placed.  The patient's body temperature was cooled to 32  degrees.  Aortic crossclamp was applied and 1000 mL of retrograde cardioplegia was administered.  During this process, the patient had adequate myocardial protection, myocardial septal temperature was monitored throughout the crossclamp.  We opened the aorta and gave additional cold blood cardioplegia directly into each of the coronary ostium.  Topical cold was also used.  With the rest of the heart we initially proceeded with left-sided Maze lesions including  bipolar lesion across the base of left atrial appendage and around the right and left pulmonary veins.  A 35 mm atrial clip was placed on the atrial appendage.  We then proceeded to deal with the aortic root.  On careful examination, it was noted that aortic root was dilated to almost 7 cm. The leaflets were extremely elongated with the exception of the right coronary cusp leaflet, which was even more distorted than the other two. It was felt that attempt to spare the aortic valve would not result in a good long-term outcome with distortion of the leaflets and proceeded with the plan to perform a biologic Bentall.  The aortic root was excised, the valve leaflets were excised, coronary buttons of the right and left coronary were performed.  Care was taken especially with the left button as this had a very short left main.  Ascending was aorta was divided just below the takeoff of the innominate artery and the cross- clamp.  A 30-mm Valsalva graft was selected and the annulus was sized for a 25 pericardial tissue valve.  #2 Tycron pledgeted sutures were placed circumferentially around the annulus with the pledgets on the outside surface and then through the valve and through the Valsalva graft.  This graft then seated well into the annulus and was secured in place.  White coating of Coseal was placed around the valve sutures.  We then proceeded with trimming the left coronary button and anastomosing it to the proper area on the graft, which had been excised with cautery. Using a running 5-0 Prolene stitch, the coronary button was attached without difficulty.  There was no obvious kinking.  With this completed, additional cold blood cardioplegia had been administered retrograde.  We also administered antegrade cardioplegia into the aortic root testing the seal of the aortic root and also the left coronary button.  We then trimmed the graft to the appropriate length and distal anastomosis  was performed with a running 3-0 Prolene on an SH needle over felt strips. The anastomosis was completed and we then opened the aorta for the right coronary button.  This was also sewed on as anteriorly as possible to avoid any kinking of the right coronary to the ascending aorta at the Valsalva.  With this completed and before completely closing the graft, the heart was allowed to passively fill and de-air.  150 mL of warm retrograde cardioplegia was administered during the operative procedure. CO2 had been instilled into the pericardial space.  The operative field. The crossclamp was then removed with a total crossclamp time of 175 minutes.  A 16-gauge needle was introduced into the left ventricular apex to further de-air the heart.  The patient did require electrical defibrillation and returned to a sinus rhythm.  Atrial and ventricular pacing wires were applied.  He was atrially paced with increased rate. Body temperature rewarmed to 37 degrees.  With operative field recently hemostatic, the patient was then weaned from cardiopulmonary bypass.  As we weaned, first we removed the lower venous cannula.  TEE at this point showed good functioning of  the aortic valve and overall good myocardial function.  The right superior pulmonary vein vent was removed.  The patient was then separated from cardiopulmonary bypass.  The superior venous cannula was removed and the aortic cannula was removed.  The patient was given protamine with the operative field hemostatic.  Two Blake mediastinal drains were left in place.  The pericardium was reapproximated.  Sternum was closed with #6 stainless steel wire. Fascia closed with interrupted 0-Vicryl, running 3-0 Vicryl in the subcutaneous tissue for subcuticular stitch in skin edges.  Dry dressings were applied.  Sponge and needle count was reported as correct at the completion of procedure.  The patient tolerated the procedure without obvious  complications.  He did not require any blood products. Total pump time was 246 minutes.  At the completion of the procedure, the patient had remained in sinus rhythm.     Lanelle Bal, MD     EG/MEDQ  D:  09/22/2015  T:  09/22/2015  Job:  [id number]  cc:   Laverda Page, MD

## 2015-09-22 NOTE — Progress Notes (Signed)
09/22/2015 1200 Pt. Noted to be in and out Afib on monitor 120-130 in Afib, 60-70 in NSR. Afib non-sustaining. Pt. Resting in bed, asymptomatic. BP 137/61. Pt. Placed on 2L [place]. Dr. Servando Snare paged and made aware. Metoprolol po given per verbal order Dr. Servando Snare. Coumadin orders placed per telephone order Dr. Servando Snare as well as PT/INR orders. Pt and family updated on plan of care. Will continue to closely monitor patient.  Addisen Chappelle, Arville Lime

## 2015-09-22 NOTE — Progress Notes (Signed)
CT surgery p.m. Rounds  Patient examined and record reviewed.Hemodynamics stable,labs satisfactory.Patient had stable day.Continue current care. Chad Lawrence 09/22/2015   

## 2015-09-22 NOTE — Progress Notes (Signed)
Spoke with Dr. Prescott Gum regarding nausea, minimal urine output, and vitals/rhythm. Orders received for 1 albumin with calcium, 40mg  IV lasix, Reglan 10mg  IV every 6 hours, and a portable chest and KUB in the morning. Will continue to closely monitor. Richarda Blade RN

## 2015-09-23 ENCOUNTER — Inpatient Hospital Stay (HOSPITAL_COMMUNITY): Payer: 59

## 2015-09-23 LAB — CBC
HCT: 32 % — ABNORMAL LOW (ref 39.0–52.0)
Hemoglobin: 10.7 g/dL — ABNORMAL LOW (ref 13.0–17.0)
MCH: 31.2 pg (ref 26.0–34.0)
MCHC: 33.4 g/dL (ref 30.0–36.0)
MCV: 93.3 fL (ref 78.0–100.0)
Platelets: 125 10*3/uL — ABNORMAL LOW (ref 150–400)
RBC: 3.43 MIL/uL — ABNORMAL LOW (ref 4.22–5.81)
RDW: 13.5 % (ref 11.5–15.5)
WBC: 14.9 10*3/uL — ABNORMAL HIGH (ref 4.0–10.5)

## 2015-09-23 LAB — GLUCOSE, CAPILLARY
Glucose-Capillary: 114 mg/dL — ABNORMAL HIGH (ref 65–99)
Glucose-Capillary: 140 mg/dL — ABNORMAL HIGH (ref 65–99)
Glucose-Capillary: 98 mg/dL (ref 65–99)
Glucose-Capillary: 142 mg/dL — ABNORMAL HIGH (ref 65–99)

## 2015-09-23 LAB — BASIC METABOLIC PANEL
Anion gap: 9 (ref 5–15)
BUN: 19 mg/dL (ref 6–20)
CO2: 26 mmol/L (ref 22–32)
Calcium: 8.9 mg/dL (ref 8.9–10.3)
Chloride: 97 mmol/L — ABNORMAL LOW (ref 101–111)
Creatinine, Ser: 1.19 mg/dL (ref 0.61–1.24)
GFR calc Af Amer: >60 mL/min (ref >60)
GFR calc non Af Amer: >60 mL/min (ref >60)
Glucose, Bld: 134 mg/dL — ABNORMAL HIGH (ref 65–99)
Potassium: 4.3 mmol/L (ref 3.5–5.1)
Sodium: 132 mmol/L — ABNORMAL LOW (ref 135–145)

## 2015-09-23 LAB — PROTIME-INR
INR: 1.58 — ABNORMAL HIGH (ref 0.00–1.49)
Prothrombin Time: 18.9 seconds — ABNORMAL HIGH (ref 11.6–15.2)

## 2015-09-23 MED ORDER — DIGOXIN 125 MCG PO TABS
0.1250 mg | ORAL_TABLET | Freq: Every day | ORAL | Status: DC
  Administered 2015-09-23 – 2015-09-26 (×3): 0.125 mg via ORAL
  Filled 2015-09-23 (×3): qty 1

## 2015-09-23 MED ORDER — WARFARIN SODIUM 2.5 MG PO TABS
2.5000 mg | ORAL_TABLET | Freq: Once | ORAL | Status: AC
  Administered 2015-09-23: 2.5 mg via ORAL
  Filled 2015-09-23: qty 1

## 2015-09-23 MED ORDER — ASPIRIN EC 81 MG PO TBEC
81.0000 mg | DELAYED_RELEASE_TABLET | Freq: Every day | ORAL | Status: DC
  Administered 2015-09-24 – 2015-09-26 (×3): 81 mg via ORAL
  Filled 2015-09-23 (×3): qty 1

## 2015-09-23 MED ORDER — FUROSEMIDE 10 MG/ML IJ SOLN
40.0000 mg | Freq: Once | INTRAMUSCULAR | Status: AC
  Administered 2015-09-23: 40 mg via INTRAVENOUS
  Filled 2015-09-23: qty 4

## 2015-09-23 MED ORDER — FUROSEMIDE 40 MG PO TABS
40.0000 mg | ORAL_TABLET | Freq: Every day | ORAL | Status: DC
  Administered 2015-09-24 – 2015-09-26 (×3): 40 mg via ORAL
  Filled 2015-09-23 (×3): qty 1

## 2015-09-23 NOTE — Progress Notes (Signed)
3 Days Post-Op Procedure(s) (LRB): BENTALL PROCEDURE (N/A) TRANSESOPHAGEAL ECHOCARDIOGRAM (TEE) (N/A) MAZE (N/A) CLIPPING OF ATRIAL APPENDAGE (N/A) Subjective: Patient feels improved today, back in atrial fibrillation--history of preoperative atrial fibrillation Coumadin has been resumed Abdominal status improved, did well with breakfast Walking in the hallway Ready for transfer to stepdown Objective: Vital signs in last 24 hours: Temp:  [97.3 F (36.3 C)-98.9 F (37.2 C)] 97.3 F (36.3 C) (11/05 0833) Pulse Rate:  [41-120] 119 (11/05 1000) Cardiac Rhythm:  [-] Atrial fibrillation (11/05 1000) Resp:  [13-28] 28 (11/05 1000) BP: (96-137)/(61-88) 112/80 mmHg (11/05 1000) SpO2:  [92 %-100 %] 93 % (11/05 1000) Weight:  [192 lb (87.091 kg)] 192 lb (87.091 kg) (11/05 0630)  Hemodynamic parameters for last 24 hours:   intermittent atrial fibrillation with stable blood pressure, afebrile  Intake/Output from previous day: 11/04 0701 - 11/05 0700 In: 720 [P.O.:460; I.V.:10; IV Piggyback:250] Out: 965 [Urine:965] Intake/Output this shift: Total I/O In: 340 [P.O.:340] Out: 425 [Urine:425]  Neuro intact Incisions clean and dry Mild edema   Lab Results:  Recent Labs  09/22/15 0336 09/23/15 0457  WBC 14.2* 14.9*  HGB 10.6* 10.7*  HCT 30.8* 32.0*  PLT 91* 125*   BMET:  Recent Labs  09/22/15 0336 09/23/15 0457  NA 136 132*  K 3.8 4.3  CL 102 97*  CO2 26 26  GLUCOSE 136* 134*  BUN 9 19  CREATININE 0.91 1.19  CALCIUM 8.3* 8.9    PT/INR:  Recent Labs  09/23/15 0457  LABPROT 18.9*  INR 1.58*   ABG    Component Value Date/Time   PHART 7.326* 09/20/2015 2153   HCO3 21.3 09/20/2015 2153   TCO2 23 09/20/2015 2153   TCO2 20 09/20/2015 2153   ACIDBASEDEF 5.0* 09/20/2015 2153   O2SAT 99.0 09/20/2015 2153   CBG (last 3)   Recent Labs  09/22/15 1633 09/22/15 2141 09/23/15 0830  GLUCAP 128* 157* 114*    Assessment/Plan: S/P Procedure(s) (LRB): BENTALL  PROCEDURE (N/A) TRANSESOPHAGEAL ECHOCARDIOGRAM (TEE) (N/A) MAZE (N/A) CLIPPING OF ATRIAL APPENDAGE (N/A) Mobilize Diuresis Diabetes control Plan for transfer to step-down: see transfer orders Continue Coumadin  Add Lanoxin for heart rate control of  atrial fibrillation   LOS: 3 days    Tharon Aquas Trigt III 09/23/2015

## 2015-09-24 ENCOUNTER — Inpatient Hospital Stay (HOSPITAL_COMMUNITY): Payer: 59

## 2015-09-24 LAB — COMPREHENSIVE METABOLIC PANEL
ALT: 388 U/L — ABNORMAL HIGH (ref 17–63)
AST: 215 U/L — ABNORMAL HIGH (ref 15–41)
Albumin: 3.1 g/dL — ABNORMAL LOW (ref 3.5–5.0)
Alkaline Phosphatase: 65 U/L (ref 38–126)
Anion gap: 9 (ref 5–15)
BUN: 23 mg/dL — ABNORMAL HIGH (ref 6–20)
CO2: 26 mmol/L (ref 22–32)
Calcium: 8.4 mg/dL — ABNORMAL LOW (ref 8.9–10.3)
Chloride: 98 mmol/L — ABNORMAL LOW (ref 101–111)
Creatinine, Ser: 1.1 mg/dL (ref 0.61–1.24)
GFR calc Af Amer: >60 mL/min (ref >60)
GFR calc non Af Amer: >60 mL/min (ref >60)
Glucose, Bld: 108 mg/dL — ABNORMAL HIGH (ref 65–99)
Potassium: 4 mmol/L (ref 3.5–5.1)
Sodium: 133 mmol/L — ABNORMAL LOW (ref 135–145)
Total Bilirubin: 0.9 mg/dL (ref 0.3–1.2)
Total Protein: 5.6 g/dL — ABNORMAL LOW (ref 6.5–8.1)

## 2015-09-24 LAB — GLUCOSE, CAPILLARY
Glucose-Capillary: 106 mg/dL — ABNORMAL HIGH (ref 65–99)
Glucose-Capillary: 127 mg/dL — ABNORMAL HIGH (ref 65–99)

## 2015-09-24 LAB — PROTIME-INR
INR: 1.67 — ABNORMAL HIGH (ref 0.00–1.49)
Prothrombin Time: 19.7 seconds — ABNORMAL HIGH (ref 11.6–15.2)

## 2015-09-24 LAB — CBC
HCT: 31.1 % — ABNORMAL LOW (ref 39.0–52.0)
Hemoglobin: 10.3 g/dL — ABNORMAL LOW (ref 13.0–17.0)
MCH: 30.7 pg (ref 26.0–34.0)
MCHC: 33.1 g/dL (ref 30.0–36.0)
MCV: 92.8 fL (ref 78.0–100.0)
Platelets: 169 10*3/uL (ref 150–400)
RBC: 3.35 MIL/uL — ABNORMAL LOW (ref 4.22–5.81)
RDW: 13.4 % (ref 11.5–15.5)
WBC: 13.1 10*3/uL — ABNORMAL HIGH (ref 4.0–10.5)

## 2015-09-24 MED ORDER — WARFARIN SODIUM 2.5 MG PO TABS
2.5000 mg | ORAL_TABLET | Freq: Every day | ORAL | Status: AC
  Administered 2015-09-24: 2.5 mg via ORAL
  Filled 2015-09-24: qty 1

## 2015-09-24 MED ORDER — METOPROLOL TARTRATE 25 MG PO TABS
25.0000 mg | ORAL_TABLET | Freq: Two times a day (BID) | ORAL | Status: DC
  Administered 2015-09-24 – 2015-09-26 (×5): 25 mg via ORAL
  Filled 2015-09-24 (×5): qty 1

## 2015-09-24 MED ORDER — ZOLPIDEM TARTRATE 5 MG PO TABS
5.0000 mg | ORAL_TABLET | Freq: Every evening | ORAL | Status: DC | PRN
  Administered 2015-09-24: 5 mg via ORAL
  Filled 2015-09-24: qty 1

## 2015-09-24 NOTE — Progress Notes (Signed)
EPWs removed per protocol.  No ectopy or other problem noted at this time.  Will continue to monitor.

## 2015-09-24 NOTE — Progress Notes (Addendum)
      [address]       [place] [address]             [phone]      4 Days Post-Op Procedure(s) (LRB): BENTALL PROCEDURE (N/A) TRANSESOPHAGEAL ECHOCARDIOGRAM (TEE) (N/A) MAZE (N/A) CLIPPING OF ATRIAL APPENDAGE (N/A)   Subjective:  Chad Lawrence complains of sweats when using Oxycodone.  He states the Tramadol works better for him.  The patient was instructed that both of these medications are as needed and he does not have to use Oxycodone for pain relief.  + ambulation  + BM  Objective: Vital signs in last 24 hours: Temp:  [97.3 F (36.3 C)-98.8 F (37.1 C)] 98 F (36.7 C) (11/06 0611) Pulse Rate:  [55-136] 110 (11/06 0611) Cardiac Rhythm:  [-] Atrial fibrillation (11/05 2015) Resp:  [14-28] 19 (11/06 0611) BP: (96-124)/(64-86) 117/78 mmHg (11/06 0611) SpO2:  [91 %-96 %] 96 % (11/06 0615) Weight:  [190 lb 7.6 oz (86.4 kg)] 190 lb 7.6 oz (86.4 kg) (11/06 0611)  Intake/Output from previous day: 11/05 0701 - 11/06 0700 In: 580 [P.O.:580] Out: 2000 [Urine:2000]  General appearance: alert, cooperative and no distress Heart: irregularly irregular rhythm Lungs: clear to auscultation bilaterally Abdomen: soft, non-tender; bowel sounds normal; no masses,  no organomegaly Extremities: edema trace Wound: clean and dry  Lab Results:  Recent Labs  09/23/15 0457 09/24/15 0406  WBC 14.9* 13.1*  HGB 10.7* 10.3*  HCT 32.0* 31.1*  PLT 125* 169   BMET:  Recent Labs  09/23/15 0457 09/24/15 0133  NA 132* 133*  K 4.3 4.0  CL 97* 98*  CO2 26 26  GLUCOSE 134* 108*  BUN 19 23*  CREATININE 1.19 1.10  CALCIUM 8.9 8.4*    PT/INR:  Recent Labs  09/24/15 0133  LABPROT 19.7*  INR 1.67*   ABG    Component Value Date/Time   PHART 7.326* 09/20/2015 2153   HCO3 21.3 09/20/2015 2153   TCO2 23 09/20/2015 2153   TCO2 20 09/20/2015 2153   ACIDBASEDEF 5.0* 09/20/2015 2153   O2SAT 99.0 09/20/2015 2153   CBG (last 3)   Recent Labs  09/23/15 1605  09/23/15 2047 09/24/15 0611  GLUCAP 98 142* 106*    Assessment/Plan: S/P Procedure(s) (LRB): BENTALL PROCEDURE (N/A) TRANSESOPHAGEAL ECHOCARDIOGRAM (TEE) (N/A) MAZE (N/A) CLIPPING OF ATRIAL APPENDAGE (N/A)  1. CV- Atrial Fibrillatin- rates continue to fluctuate between 100-130- will increase Lopressor to 25 mg BID if BP allows, continue Digoxin, Amidoarone 2. Pulm- no acute issues, off oxygen continue IS 3. INR 1.67, will repeat Coumadin at 2.5 mg daily 4. Renal- creatinine mildly elevated at 1.10, weight improving, continue diuresis for now 5. Dispo- patient continues to have Atrial Fibrillation with varying rates will try to increase Lopressor if pressure allows, continue Dig, Amiodarone... Is patient a candidate for cardioversion?, will d/c EPW as INR is trending up.... Patient is medically stable and ready for d/c once HR is stable   LOS: 4 days    BARRETT, ERIN 09/24/2015  Remains in Afib with high HR- INR is rising and lopressor dose is increased. Digoxin added- hx of chronic afib so would not benefit from cardioversion Getting stronger  patient examined and medical record reviewed,agree with above note. Tharon Aquas Trigt III 09/24/2015

## 2015-09-24 NOTE — Discharge Instructions (Addendum)
Aortic Valve Replacement, Care After Refer to this sheet in the next few weeks. These instructions provide you with information on caring for yourself after your procedure. Your health care provider may also give you specific instructions. Your treatment has been planned according to current medical practices, but problems sometimes occur. Call your health care provider if you have any problems or questions after your procedure. HOME CARE INSTRUCTIONS   Take medicines only as directed by your health care provider.  If your health care provider has prescribed elastic stockings, wear them as directed.  Take frequent naps or rest often throughout the day.  Avoid lifting over 10 lbs (4.5 kg) or pushing or pulling things with your arms for 6-8 weeks or as directed by your health care provider.  Avoid driving or airplane travel for 4-6 weeks after surgery or as directed by your health care provider. If you are riding in a car for an extended period, stop every 1-2 hours to stretch your legs. Keep a record of your medicines and medical history with you when traveling.  Do not drive or operate heavy machinery while taking pain medicine. (narcotics).  Do not cross your legs.  Do not use any tobacco products including cigarettes, chewing tobacco, or electronic cigarettes. If you need help quitting, ask your health care provider.  Do not take baths, swim, or use a hot tub until your health care provider approves. Take showers once your health care provider approves. Pat incisions dry. Do not rub incisions with a washcloth or towel.  Avoid climbing stairs and using the handrail to pull yourself up for the first 2-3 weeks after surgery.  Return to work as directed by your health care provider.  Drink enough fluid to keep your urine clear or pale yellow.  Do not strain to have a bowel movement. Eat high-fiber foods if you become constipated. You may also take a medicine to help you have a bowel  movement (laxative) as directed by your health care provider.  Resume sexual activity as directed by your health care provider. Men should not use medicines for erectile dysfunction until their doctor says it isokay.  If you had a certain type of heart condition in the past, you may need to take antibiotic medicine before having dental work or surgery. Let your dentist and health care providers know if you had one or more of the following:  Previous endocarditis.  An artificial (prosthetic) heart valve.  Congenital heart disease. SEEK MEDICAL CARE IF:  You develop a skin rash.   You experience sudden changes in your weight.  You have a fever. SEEK IMMEDIATE MEDICAL CARE IF:   You develop chest pain that is not coming from your incision.  You have drainage (pus), redness, swelling, or pain at your incision site.   You develop shortness of breath or have difficulty breathing.   You have increased bleeding from your incision site.   You develop light-headedness.  MAKE SURE YOU:   Understand these directions.  Will watch your condition.  Will get help right away if you are not doing well or get worse.   This information is not intended to replace advice given to you by your health care provider. Make sure you discuss any questions you have with your health care provider.   Document Released: 05/23/2005 Document Revised: 11/25/2014 Document Reviewed: 08/18/2012 Elsevier Interactive Patient Education 2016 [place] on my medicine - Coumadin   (Warfarin)  Why was Coumadin prescribed for  you? Coumadin was prescribed for you because you have a blood clot or a medical condition that can cause an increased risk of forming blood clots. Blood clots can cause serious health problems by blocking the flow of blood to the heart, lung, or brain. Coumadin can prevent harmful blood clots from forming. As a reminder your indication for Coumadin is:   Stroke  Prevention Because Of Atrial Fibrillation  What test will check on my response to Coumadin? While on Coumadin (warfarin) you will need to have an INR test regularly to ensure that your dose is keeping you in the desired range. The INR (international normalized ratio) number is calculated from the result of the laboratory test called prothrombin time (PT).  If an INR APPOINTMENT HAS NOT ALREADY BEEN MADE FOR YOU please schedule an appointment to have this lab work done by your health care provider within 7 days. Your INR goal is usually a number between:  2 to 3 or your provider may give you a more narrow range like 2-2.5.  Ask your health care provider during an office visit what your goal INR is.  What  do you need to  know  About  COUMADIN? Take Coumadin (warfarin) exactly as prescribed by your healthcare provider about the same time each day.  DO NOT stop taking without talking to the doctor who prescribed the medication.  Stopping without other blood clot prevention medication to take the place of Coumadin may increase your risk of developing a new clot or stroke.  Get refills before you run out.  What do you do if you miss a dose? If you miss a dose, take it as soon as you remember on the same day then continue your regularly scheduled regimen the next day.  Do not take two doses of Coumadin at the same time.  Important Safety Information A possible side effect of Coumadin (Warfarin) is an increased risk of bleeding. You should call your healthcare provider right away if you experience any of the following: ? Bleeding from an injury or your nose that does not stop. ? Unusual colored urine (red or dark brown) or unusual colored stools (red or black). ? Unusual bruising for unknown reasons. ? A serious fall or if you hit your head (even if there is no bleeding).  Some foods or medicines interact with Coumadin (warfarin) and might alter your response to warfarin. To help avoid this: ? Eat a  balanced diet, maintaining a consistent amount of Vitamin K. ? Notify your provider about major diet changes you plan to make. ? Avoid alcohol or limit your intake to 1 drink for women and 2 drinks for men per day. (1 drink is 5 oz. wine, 12 oz. beer, or 1.5 oz. liquor.)  Make sure that ANY health care provider who prescribes medication for you knows that you are taking Coumadin (warfarin).  Also make sure the healthcare provider who is monitoring your Coumadin knows when you have started a new medication including herbals and non-prescription products.  Coumadin (Warfarin)  Major Drug Interactions  Increased Warfarin Effect Decreased Warfarin Effect  Alcohol (large quantities) Antibiotics (esp. Septra/Bactrim, Flagyl, Cipro) Amiodarone (Cordarone) Aspirin (ASA) Cimetidine (Tagamet) Megestrol (Megace) NSAIDs (ibuprofen, naproxen, etc.) Piroxicam (Feldene) Propafenone (Rythmol SR) Propranolol (Inderal) Isoniazid (INH) Posaconazole (Noxafil) Barbiturates (Phenobarbital) Carbamazepine (Tegretol) Chlordiazepoxide (Librium) Cholestyramine (Questran) Griseofulvin Oral Contraceptives Rifampin Sucralfate (Carafate) Vitamin K   Coumadin (Warfarin) Major Herbal Interactions  Increased Warfarin Effect Decreased Warfarin Effect  Garlic Ginseng Ginkgo biloba Coenzyme  Q10 Green tea St. Johns wort    Coumadin (Warfarin) FOOD Interactions  Eat a consistent number of servings per week of foods HIGH in Vitamin K (1 serving =  cup)  Collards (cooked, or boiled & drained) Kale (cooked, or boiled & drained) Mustard greens (cooked, or boiled & drained) Parsley *serving size only =  cup Spinach (cooked, or boiled & drained) Swiss chard (cooked, or boiled & drained) Turnip greens (cooked, or boiled & drained)  Eat a consistent number of servings per week of foods MEDIUM-HIGH in Vitamin K (1 serving = 1 cup)  Asparagus (cooked, or boiled & drained) Broccoli (cooked, boiled & drained,  or raw & chopped) Brussel sprouts (cooked, or boiled & drained) *serving size only =  cup Lettuce, raw (green leaf, endive, romaine) Spinach, raw Turnip greens, raw & chopped   These websites have more information on Coumadin (warfarin):  FailFactory.se; VeganReport.com.au;

## 2015-09-25 LAB — PROTIME-INR
INR: 2.33 — ABNORMAL HIGH (ref 0.00–1.49)
Prothrombin Time: 25.3 seconds — ABNORMAL HIGH (ref 11.6–15.2)

## 2015-09-25 LAB — CBC
HCT: 31.5 % — ABNORMAL LOW (ref 39.0–52.0)
Hemoglobin: 10.3 g/dL — ABNORMAL LOW (ref 13.0–17.0)
MCH: 30.8 pg (ref 26.0–34.0)
MCHC: 32.7 g/dL (ref 30.0–36.0)
MCV: 94.3 fL (ref 78.0–100.0)
Platelets: 207 10*3/uL (ref 150–400)
RBC: 3.34 MIL/uL — ABNORMAL LOW (ref 4.22–5.81)
RDW: 13.5 % (ref 11.5–15.5)
WBC: 11 10*3/uL — ABNORMAL HIGH (ref 4.0–10.5)

## 2015-09-25 LAB — BASIC METABOLIC PANEL
Anion gap: 10 (ref 5–15)
BUN: 19 mg/dL (ref 6–20)
CO2: 28 mmol/L (ref 22–32)
Calcium: 8.5 mg/dL — ABNORMAL LOW (ref 8.9–10.3)
Chloride: 97 mmol/L — ABNORMAL LOW (ref 101–111)
Creatinine, Ser: 1.1 mg/dL (ref 0.61–1.24)
GFR calc Af Amer: >60 mL/min (ref >60)
GFR calc non Af Amer: >60 mL/min (ref >60)
Glucose, Bld: 114 mg/dL — ABNORMAL HIGH (ref 65–99)
Potassium: 3.9 mmol/L (ref 3.5–5.1)
Sodium: 135 mmol/L (ref 135–145)

## 2015-09-25 MED ORDER — ALPRAZOLAM 0.5 MG PO TABS: 0.5000 mg | ORAL_TABLET | Freq: Every evening | ORAL | Status: DC | PRN

## 2015-09-25 MED ORDER — WARFARIN SODIUM 1 MG PO TABS
1.0000 mg | ORAL_TABLET | Freq: Every day | ORAL | Status: DC
  Administered 2015-09-25 – 2015-09-26 (×2): 1 mg via ORAL
  Filled 2015-09-25 (×2): qty 1

## 2015-09-25 MED ORDER — POTASSIUM CHLORIDE CRYS ER 10 MEQ PO TBCR
10.0000 meq | EXTENDED_RELEASE_TABLET | Freq: Once | ORAL | Status: AC
  Administered 2015-09-25: 10 meq via ORAL
  Filled 2015-09-25: qty 1

## 2015-09-25 MED ORDER — DILTIAZEM LOAD VIA INFUSION
25.0000 mg | Freq: Once | INTRAVENOUS | Status: AC
  Administered 2015-09-25: 25 mg via INTRAVENOUS
  Filled 2015-09-25: qty 25

## 2015-09-25 MED ORDER — DILTIAZEM HCL 100 MG IV SOLR
10.0000 mg/h | INTRAVENOUS | Status: AC
  Filled 2015-09-25 (×2): qty 100

## 2015-09-25 MED ORDER — DILTIAZEM HCL ER COATED BEADS 120 MG PO CP24
120.0000 mg | ORAL_CAPSULE | Freq: Every day | ORAL | Status: DC
  Administered 2015-09-25 – 2015-09-26 (×2): 120 mg via ORAL
  Filled 2015-09-25 (×2): qty 1

## 2015-09-25 NOTE — Consult Note (Signed)
CARDIOLOGY CONSULT NOTE  Patient ID: Chad Lawrence MRN: [id number] DOB/AGE: [birth year] 64 y.o.  Admit date: 09/20/2015 Referring Physician  Ceasar Mons, MD Primary Physician:  [name], MD Reason for Consultation  A. Fib with RVR.  HPI: Chad Lawrence  is a 64 y.o. male  With history of ascending aortic aneurysm, underwent Bentall procedure with 30 mm Gelweave Valsalva graft and [employer] pericardial tissue valve 25 mm model 3300 TFX serial [id number] with reimplantation of right and left coronary arteries and left-sided Maze and placement of atrial clip.by Dr. 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 He has mild coronary ectasia without significant coronary artery disease. He has no other significant car to vascular risk factors, does have family history of ascending aortic aneurysm in his father who died at age of 1 with rupture.  Patient initially presented to me about 2 weeks ago with new onset atrial fibrillation and outpatient echocardiogram had revealed large ascending aortic aneurysm and moderate to severe aortic insufficiency. This eventually led to referral to Dr. Servando Snare and surgery in a relatively quick fashion.  Postoperatively he has had difficulty in control of atrial fibrillation right. Hence I was consulted to manage this.  Patient's family at the bedside, patient states that he's feeling well except he feels generally tired. He denies any significant shortness of breath. He states that surgical site pain is not bad. No leg edema. Requests a different agent for sleeping instead of Ambien states that it was too strong.  Past Medical History  Diagnosis Date  . Atrial fibrillation ([place])   . Aortic regurgitation   . Ascending aorta dilatation ([facility])   . Pneumonia      Past Surgical History  Procedure Laterality  Date  . Cardiac catheterization N/A 09/12/2015    Procedure: Left Heart Cath and Coronary Angiography;  Surgeon: Adrian Prows, MD;  Location: [facility];  Service: Cardiovascular;  Laterality: N/A;  . Hernia repair    . Colonoscopy      x 2  . Bentall procedure N/A 09/20/2015    Procedure: BENTALL PROCEDURE;  Surgeon: [name], MD;  Location: [facility];  Service: Open Heart Surgery;  Laterality: N/A;  . Tee without cardioversion N/A 09/20/2015    Procedure: TRANSESOPHAGEAL ECHOCARDIOGRAM (TEE);  Surgeon: [name], MD;  Location: [facility];  Service: Open Heart Surgery;  Laterality: N/A;  . Maze N/A 09/20/2015    Procedure: MAZE;  Surgeon: [name], MD;  Location: [facility];  Service: Open Heart Surgery;  Laterality: N/A;  . Clipping of atrial appendage N/A 09/20/2015    Procedure: CLIPPING OF ATRIAL APPENDAGE;  Surgeon: [name], MD;  Location: [facility];  Service: Open Heart Surgery;  Laterality: N/A;     Family History  Problem Relation Age of Onset  . Hypertension Mother   . Breast cancer Mother   . Atrial fibrillation Mother   . Emphysema Father   . Aneurysm Father     abdominal aortic aneurysm  . COPD Father      Social History: Social History   Social History  . Marital Status: Married    Spouse Name: N/A  . Number of Children: 5  . Years of Education: N/A   Occupational History  . Not on file.   Social History Main Topics  . Smoking status: Never Smoker   . Smokeless  tobacco: Never Used  . Alcohol Use: 0.0 oz/week    0 Standard drinks or equivalent per week     Comment: occasional  . Drug Use: No  . Sexual Activity: Not on file   Other Topics Concern  . Not on file   Social History Narrative     Prescriptions prior to admission  Medication Sig Dispense Refill Last Dose  . aspirin EC 81 MG tablet Take 81 mg by mouth every evening.    09/19/2015 at Unknown time  . clotrimazole-betamethasone (LOTRISONE) cream Apply 1 application  topically daily as needed (rash).   1 Past Week at Unknown time  . metoprolol succinate (TOPROL-XL) 25 MG 24 hr tablet Take 25 mg by mouth every evening.   3 09/20/2015 at 1900  . apixaban (ELIQUIS) 5 MG TABS tablet Take 5 mg by mouth 2 (two) times daily.   09/06/2015     ROS: General: no fevers/chills/night sweats Eyes: no blurry vision, diplopia, or amaurosis ENT: no sore throat or hearing loss Resp: no cough, wheezing, or hemoptysis CV: no edema or palpitations GI: no abdominal pain, nausea, vomiting, diarrhea, or constipation GU: no dysuria, frequency, or hematuria Skin: no rash Neuro: no headache, numbness, tingling, or weakness of extremities Musculoskeletal: no joint pain or swelling Heme: no bleeding, DVT, or easy bruising Endo: no polydipsia or polyuria    Physical Exam: Blood pressure 117/82, pulse 87, temperature 98 F (36.7 C), temperature source Oral, resp. rate 19, height 6' (1.829 m), weight 85.821 kg (189 lb 3.2 oz), SpO2 96 %.   General appearance: alert, cooperative, appears stated age and no distress Lungs: clear to auscultation bilaterally Heart: irregularly irregular rhythm and S1 is variable S2 is normal, no gallop, no murmur appreciated. No pericardial rub. Abdomen: soft, non-tender; bowel sounds normal; no masses,  no organomegaly Extremities: extremities normal, atraumatic, no cyanosis or edema Pulses: 2+ and symmetric Neurologic: Grossly normal  Labs:   Lab Results  Component Value Date   WBC 11.0* 09/25/2015   HGB 10.3* 09/25/2015   HCT 31.5* 09/25/2015   MCV 94.3 09/25/2015   PLT 207 09/25/2015    Recent Labs Lab 09/24/15 0133 09/25/15 0415  NA 133* 135  K 4.0 3.9  CL 98* 97*  CO2 26 28  BUN 23* 19  CREATININE 1.10 1.10  CALCIUM 8.4* 8.5*  PROT 5.6*  --   BILITOT 0.9  --   ALKPHOS 65  --   ALT 388*  --   AST 215*  --   GLUCOSE 108* 114*    Lipid Panel  No results found for: CHOL, TRIG, HDL, CHOLHDL, VLDL, LDLCALC  BNP (last 3  results) No results for input(s): BNP in the last 8760 hours.  ProBNP (last 3 results) No results for input(s): PROBNP in the last 8760 hours.  HEMOGLOBIN A1C Lab Results  Component Value Date   HGBA1C 5.8* 09/18/2015   MPG 120 09/18/2015    Cardiac Panel (last 3 results) No results for input(s): CKTOTAL, CKMB, TROPONINI, RELINDX in the last 8760 hours.  No results found for: CKTOTAL, CKMB, CKMBINDEX, TROPONINI   TSH No results for input(s): TSH in the last 8760 hours.  EKG: 09/21/2015: Junctional escape rhythm, underlying probable atrial fibrillation, ventricular rate of 45 bpm.  09/20/2015: Normal sinus rhythm/sinus pericardia.  Telemetry atrial fibrillation with rapid ventricle response.  Echocardiogram 09/05/2015:  Left ventricle cavity is normal in size. Mild to moderate concentric hypertrophy of the left ventricle. Normal global wall motion. Calculated EF  65%. Aortic valve is tricuspid. Moderate to severe aortic regurgitation. 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 Severely dilated ascending aorta.   Radiology: Dg Chest 2 View  09/24/2015  CLINICAL DATA:  64 year old male status post aortic valve replacement. EXAM: CHEST  2 VIEW COMPARISON:  09/23/2015. FINDINGS: Multiple ECG leads project over the central thorax, limiting assessment for other hardware. No definite tubes or lines are identified on today's examination. Lung volumes are very low. There are extensive bibasilar opacities which are presumably related to small bilateral pleural effusions and areas of postoperative atelectasis. Crowding of the pulmonary vasculature, accentuated by low lung volumes, without frank pulmonary edema. Cardiopericardial silhouette appears borderline enlarged, within normal limits for a postoperative patient. Upper mediastinal contours are distorted by low lung volumes and patient's rotation to the right. Status post median sternotomy for aortic valve replacement (a stented  aortic valve bioprosthesis is noted), and left atrial appendage ligation (a ligation clip is noted). IMPRESSION: 1. Postoperative changes and support apparatus, as above. 2. Low lung volumes with small bilateral pleural effusions and extensive postoperative passive atelectasis in the lung bases bilaterally. Electronically Signed   By: Vinnie Langton M.D.   On: 09/24/2015 07:55    Scheduled Meds: . amiodarone  400 mg Oral Q12H   Followed by  . [START ON 09/28/2015] amiodarone  400 mg Oral Daily  . antiseptic oral rinse  7 mL Mouth Rinse BID  . aspirin EC  81 mg Oral Daily  . digoxin  0.125 mg Oral Daily  . docusate sodium  200 mg Oral Daily  . furosemide  40 mg Oral Daily  . metoCLOPramide (REGLAN) injection  10 mg Intravenous 4 times per day  . metoprolol tartrate  25 mg Oral BID  . pantoprazole  40 mg Oral QAC breakfast  . sodium chloride  3 mL Intravenous Q12H  . warfarin  1 mg Oral q1800  . Warfarin - Physician Dosing Inpatient   Does not apply q1800   Continuous Infusions: . diltiazem (CARDIZEM) infusion 10 mg/hr (09/25/15 0900)   PRN Meds:.sodium chloride, bisacodyl **OR** bisacodyl, ondansetron **OR** ondansetron (ZOFRAN) IV, oxyCODONE, sodium chloride, traMADol, zolpidem  ASSESSMENT AND PLAN:  1. Paroxysmal atrial fibrillation with rapid ventricular response. Patient postoperatively after Maze procedure on 09/20/2015 was in sinus rhythm. 2. History of ascending aortic aneurysm, underwent Bentall procedure with 30 mm Gelweave Valsalva graft and [employer] pericardial tissue valve 25 mm model 3300 TFX serial #[id number] with reimplantation of right and left coronary arteries and left-sided Maze and placement of atrial clip.by Dr. 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  Recommendation: Since starting IV Cardizem, heart rate is much  better improved and controlled. I'll switch him to by mouth Cardizem, follow-up heart rate control. Suspect heart rate will be better controlled with combination of beta blocker, Cardizem and digitoxin. Continue amiodarone. I will consider cardioversion in 3-4 weeks, only if heart rate is not controlled I will consider inpatient cardioversion during this hospitalization.  Adrian Prows, MD 09/25/2015, 6:22 PM [facility] [place] Pager: [phone] Office: [phone] If no answer Cell [phone]

## 2015-09-25 NOTE — Progress Notes (Signed)
CARDIAC REHAB PHASE I   PRE:  Rate/Rhythm: 111-125 afib  BP:  Supine: 137/87  Sitting:   Standing:    SaO2: 97%RA  MODE:  Ambulation: 550 ft   POST:  Rate/Rhythm: 137-148 afib   BP:  Supine:   Sitting: 131/97  Standing:    SaO2: 95%RA [id number] Pt walked 550 ft on RA with steady gait. Stopped once for short rest while I checked heart rate at 138. Pt became more noticeably SOB with increased HR. To bed after walk. Discussed atrial fib and reason for eliquis and gave Off the Beat booklet. Encouraged IS and discussed education. Reviewed sternal precautions, ex ed and gave heart healthy diet. Discussed CRP 2 and will refer to [place]. Wrote down how to view post op video. Graylon Good RN BSN 09/25/2015 9:38 AM      Graylon Good, RN BSN  09/25/2015 9:34 AM

## 2015-09-25 NOTE — Progress Notes (Addendum)
      [address]       [place], [address]             [phone]        5 Days Post-Op Procedure(s) (LRB): BENTALL PROCEDURE (N/A) TRANSESOPHAGEAL ECHOCARDIOGRAM (TEE) (N/A) MAZE (N/A) CLIPPING OF ATRIAL APPENDAGE (N/A)  Subjective: Patient eating fruit this am. No specific complaints.  Objective: Vital signs in last 24 hours: Temp:  [97.9 F (36.6 C)-99.3 F (37.4 C)] 98 F (36.7 C) (11/07 0432) Pulse Rate:  [82-130] 101 (11/07 0432) Cardiac Rhythm:  [-] Atrial fibrillation (11/06 1900) Resp:  [18] 18 (11/07 0432) BP: (107-118)/(71-81) 118/81 mmHg (11/07 0432) SpO2:  [94 %-98 %] 95 % (11/07 0432) Weight:  [189 lb 3.2 oz (85.821 kg)] 189 lb 3.2 oz (85.821 kg) (11/07 0432)  Pre op weight 82.7 kg Current Weight  09/25/15 189 lb 3.2 oz (85.821 kg)      Intake/Output from previous day: 11/06 0701 - 11/07 0700 In: 720 [P.O.:720] Out: 1700 [Urine:1700]   Physical Exam:  Cardiovascular: IRRR IRRR Pulmonary: Clear to auscultation bilaterally; no rales, wheezes, or rhonchi. Abdomen: Soft, non tender, bowel sounds present. Extremities: Trace lower extremity edema. Wounds: Clean and dry.  No erythema or signs of infection.  Lab Results: CBC: Recent Labs  09/24/15 0406 09/25/15 0415  WBC 13.1* 11.0*  HGB 10.3* 10.3*  HCT 31.1* 31.5*  PLT 169 207   BMET:  Recent Labs  09/24/15 0133 09/25/15 0415  NA 133* 135  K 4.0 3.9  CL 98* 97*  CO2 26 28  GLUCOSE 108* 114*  BUN 23* 19  CREATININE 1.10 1.10  CALCIUM 8.4* 8.5*    PT/INR:  Lab Results  Component Value Date   INR 2.33* 09/25/2015   INR 1.67* 09/24/2015   INR 1.58* 09/23/2015   ABG:  INR: Will add last result for INR, ABG once components are confirmed Will add last 4 CBG results once components are confirmed  Assessment/Plan:  1. CV - A fib with HR in the 120's this am. On Amiodarone 400 mg bid, Lopressor 25 mg bid, Digoxin 0.125 mg daily, and Coumadin. INR up from 1.67  to 2.33. Will decrease Coumadin dose to 1 mg. Continue to be in a fib with uncontrolled rate. Will consult Dr. Einar Gip. 2.  Pulmonary - Encourage incentive spirometer 3. Volume Overload - On Lasix 40 mg daily 4.  Acute blood loss anemia - H and H stable at 10.3 and 31.5 5. Supplement potassium   ZIMMERMAN,DONIELLE MPA-C 09/25/2015,7:35 AM  D/c lovenox Rate slowed on cardizem drip On lasix daily  Poss home wed I have seen and examined Chad Lawrence and agree with the above assessment  and plan.  [name] MD [facility] [phone] Office [phone] 09/25/2015 11:59 AM

## 2015-09-25 NOTE — Progress Notes (Signed)
UR Completed. Sarahanne Novakowski, RN, BSN.  [phone] 

## 2015-09-25 NOTE — Progress Notes (Signed)
I spoke with Dr. Einar Gip and he said to give Cardizem 25 mg IV over 5 minutes followed by a Cardizem drip 10-15.

## 2015-09-26 LAB — PROTIME-INR
INR: 2.46 — ABNORMAL HIGH (ref 0.00–1.49)
Prothrombin Time: 26.4 seconds — ABNORMAL HIGH (ref 11.6–15.2)

## 2015-09-26 LAB — HEPATIC FUNCTION PANEL
ALT: 307 U/L — ABNORMAL HIGH (ref 17–63)
AST: 89 U/L — ABNORMAL HIGH (ref 15–41)
Albumin: 3.1 g/dL — ABNORMAL LOW (ref 3.5–5.0)
Alkaline Phosphatase: 84 U/L (ref 38–126)
Bilirubin, Direct: 0.2 mg/dL (ref 0.1–0.5)
Indirect Bilirubin: 1.1 mg/dL — ABNORMAL HIGH (ref 0.3–0.9)
Total Bilirubin: 1.3 mg/dL — ABNORMAL HIGH (ref 0.3–1.2)
Total Protein: 5.7 g/dL — ABNORMAL LOW (ref 6.5–8.1)

## 2015-09-26 MED ORDER — DILTIAZEM HCL ER COATED BEADS 120 MG PO CP24: 120.0000 mg | ORAL_CAPSULE | Freq: Every day | ORAL | Status: DC

## 2015-09-26 MED ORDER — FUROSEMIDE 40 MG PO TABS: 40.0000 mg | ORAL_TABLET | Freq: Every day | ORAL | Status: DC

## 2015-09-26 MED ORDER — AMIODARONE HCL 200 MG PO TABS: 200.0000 mg | ORAL_TABLET | Freq: Two times a day (BID) | ORAL | Status: DC

## 2015-09-26 MED ORDER — WARFARIN SODIUM 1 MG PO TABS: 1.0000 mg | ORAL_TABLET | Freq: Every day | ORAL | Status: DC

## 2015-09-26 MED ORDER — METOPROLOL TARTRATE 25 MG PO TABS: 25.0000 mg | ORAL_TABLET | Freq: Two times a day (BID) | ORAL | Status: DC

## 2015-09-26 MED ORDER — OXYCODONE HCL 5 MG PO TABS: 5.0000 mg | ORAL_TABLET | ORAL | Status: DC | PRN

## 2015-09-26 MED ORDER — DIGOXIN 125 MCG PO TABS: 0.1250 mg | ORAL_TABLET | Freq: Every day | ORAL | Status: DC

## 2015-09-26 MED ORDER — POTASSIUM CHLORIDE ER 20 MEQ PO TBCR: 20.0000 meq | EXTENDED_RELEASE_TABLET | Freq: Every day | ORAL | Status: DC

## 2015-09-26 MED ORDER — PATIENT'S GUIDE TO USING COUMADIN BOOK
Freq: Once | Status: AC
  Administered 2015-09-26: 10:00:00
  Filled 2015-09-26: qty 1

## 2015-09-26 NOTE — Progress Notes (Signed)
[id number] Pt just walked with his wife and stated he felt the best walking this time as compared to any other walks. Pt denied palpitations. Offered to walk with pt later or he could walk with his wife. Pt preferred to walk with wife. No questions re education done yesterday. Told pt to let RN know if he changes his mind and would like for me to come back. Graylon Good RN BSN 09/26/2015 10:55 AM .

## 2015-09-26 NOTE — Progress Notes (Signed)
Subjective:  Pt feeling better, ambulating frequently without difficulty. Very eager to go home.  Wife at bedside. Rate has improved to mid 80s to upper 90s.  Objective:  Vital Signs in the last 24 hours: Temp:  [98 F (36.7 C)-98.5 F (36.9 C)] 98 F (36.7 C) (11/08 0510) Pulse Rate:  [93-115] 115 (11/08 1031) Resp:  [18] 18 (11/08 0510) BP: (103-132)/(59-86) 114/86 mmHg (11/08 1031) SpO2:  [92 %-96 %] 92 % (11/08 0510) Weight:  [85.821 kg (189 lb 3.2 oz)] 85.821 kg (189 lb 3.2 oz) (11/08 0510)  Intake/Output from previous day: 11/07 0701 - 11/08 0700 In: 20 [P.O.:720] Out: 300 [Urine:300]  Physical Exam: General appearance: alert, cooperative, appears stated age and no distress Lungs: clear to auscultation bilaterally Heart: irregularly irregular rhythm and S1 is variable S2 is normal, no gallop, no murmur appreciated. No pericardial rub. Midsternal tenderness from sternotomy. Abdomen: soft, non-tender; bowel sounds normal; no masses, no organomegaly Extremities: extremities normal, atraumatic, no cyanosis or edema Pulses: 2+ and symmetric Neurologic: Grossly normal   Lab Results: BMP  Recent Labs  09/23/15 0457 09/24/15 0133 09/25/15 0415  NA 132* 133* 135  K 4.3 4.0 3.9  CL 97* 98* 97*  CO2 26 26 28   GLUCOSE 134* 108* 114*  BUN 19 23* 19  CREATININE 1.19 1.10 1.10  CALCIUM 8.9 8.4* 8.5*  GFRNONAA >60 >60 >60  GFRAA >60 >60 >60    CBC  Recent Labs Lab 09/25/15 0415  WBC 11.0*  RBC 3.34*  HGB 10.3*  HCT 31.5*  PLT 207  MCV 94.3  MCH 30.8  MCHC 32.7  RDW 13.5    HEMOGLOBIN A1C Lab Results  Component Value Date   HGBA1C 5.8* 09/18/2015   MPG 120 09/18/2015    Cardiac Panel (last 3 results) No results for input(s): CKTOTAL, CKMB, TROPONINI, RELINDX in the last 8760 hours.  BNP (last 3 results) No results for input(s): PROBNP in the last 8760 hours.  TSH No results for input(s): TSH in the last 8760 hours.  CHOLESTEROL No results for  input(s): CHOL in the last 8760 hours.  Hepatic Function Panel  Recent Labs  09/18/15 1148 09/24/15 0133 09/26/15 0215  PROT 7.0 5.6* 5.7*  ALBUMIN 4.0 3.1* 3.1*  AST 21 215* 89*  ALT 26 388* 307*  ALKPHOS 68 65 84  BILITOT 1.0 0.9 1.3*  BILIDIR  --   --  0.2  IBILI  --   --  1.1*    Imaging: No results found.  Cardiac Studies:  EKG 09/26/2015: atrial fibrillation with moderate VR at a rate of 90-100bpm, normal axis, PRWP, diffuse nonspecific T wave abnormality.   Assessment/Plan:  1. Paroxysmal atrial fibrillation with rapid ventricular response. Patient postoperatively after Maze procedure on 09/20/2015 was in sinus rhythm. 2. History of ascending aortic aneurysm, underwent Bentall procedure with 30 mm Gelweave Valsalva graft and [employer] pericardial tissue valve 25 mm model 3300 TFX serial #[id number] with reimplantation of right and left coronary arteries and left-sided Maze and placement of atrial clip.by Dr. 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  Recommendation: HR remains fairly well controlled with combination of beta blocker, Cardizem and digoxin. HR 80s to 90s with low 100s with ambulation. Continue amiodarone.  Follow up outpatient this Friday and consider cardioversion in 3-4 weeks.  He denies any dizziness, lightheadedness, chest pain aside from chest wall  tenderness, SOB, or palpitations.  Pt stable for discharge.    Rachel Bo, NP-C 09/26/2015, 12:36 PM [facility] Pager: [phone] Office: [phone]

## 2015-09-26 NOTE — Progress Notes (Addendum)
      [address]       [place] [address]             [phone]        6 Days Post-Op Procedure(s) (LRB): BENTALL PROCEDURE (N/A) TRANSESOPHAGEAL ECHOCARDIOGRAM (TEE) (N/A) MAZE (N/A) CLIPPING OF ATRIAL APPENDAGE (N/A)  Subjective: No specific complaints.  Objective: Vital signs in last 24 hours: Temp:  [98 F (36.7 C)-98.5 F (36.9 C)] 98 F (36.7 C) (11/08 0510) Pulse Rate:  [87-124] 101 (11/08 0510) Cardiac Rhythm:  [-] Atrial fibrillation (11/07 2000) Resp:  [18-19] 18 (11/08 0510) BP: (103-133)/(59-97) 103/82 mmHg (11/08 0510) SpO2:  [92 %-96 %] 92 % (11/08 0510) Weight:  [189 lb 3.2 oz (85.821 kg)] 189 lb 3.2 oz (85.821 kg) (11/08 0510)  Pre op weight 82.7 kg Current Weight  09/26/15 189 lb 3.2 oz (85.821 kg)      Intake/Output from previous day: 11/07 0701 - 11/08 0700 In: 720 [P.O.:720] Out: 300 [Urine:300]   Physical Exam:  Cardiovascular: IRRR IRRR Pulmonary: Clear to auscultation bilaterally; no rales, wheezes, or rhonchi. Abdomen: Soft, non tender, bowel sounds present. Extremities: Trace lower extremity edema. Wounds: Clean and dry.  No erythema or signs of infection.  Lab Results: CBC:  Recent Labs  09/24/15 0406 09/25/15 0415  WBC 13.1* 11.0*  HGB 10.3* 10.3*  HCT 31.1* 31.5*  PLT 169 207   BMET:   Recent Labs  09/24/15 0133 09/25/15 0415  NA 133* 135  K 4.0 3.9  CL 98* 97*  CO2 26 28  GLUCOSE 108* 114*  BUN 23* 19  CREATININE 1.10 1.10  CALCIUM 8.4* 8.5*    PT/INR:  Lab Results  Component Value Date   INR 2.46* 09/26/2015   INR 2.33* 09/25/2015   INR 1.67* 09/24/2015   ABG:  INR: Will add last result for INR, ABG once components are confirmed Will add last 4 CBG results once components are confirmed  Assessment/Plan:  1. CV - A fib with HR in the 90's this am. On Amiodarone 400 mg bid, Lopressor 25 mg bid, Digoxin 0.125 mg daily, Cardizem CD 120 mg daily,and Coumadin. BP is labile this  am. May have to modify medications. INR up from 2.33 to 2.46. Per Dr. Einar Gip, consider DCCV in about 3-4 weeks now that rate better controlled. 2.  Pulmonary - On room air. Encourage incentive spirometer 3. Volume Overload - On Lasix 40 mg daily 4.  Acute blood loss anemia - H and H stable at 10.3 and 31.5 5. Will discuss disposition with Dr. Servando Snare.  ZIMMERMAN,Chad Lawrence MPA-C 09/26/2015,7:25 AM  Plan d/c tomorrow if INR  stable and heart rate  controlled, before  D/c  review with Dr Einar Gip d/c cardiac meds and follow up on coumadin. Currently on coumadin 1 mg yesterday and today. Check pt tomorrow to determine home dose .  I have seen and examined Chad Lawrence and agree with the above assessment  and plan.  [name] MD Beeper [phone] Office [phone] 09/26/2015 10:04 AM

## 2015-09-26 NOTE — Progress Notes (Signed)
Pt/family given discharge instructions, medication lists, follow up appointments, and when to call the doctor.  Pt/family verbalizes understanding. Pt given signs and symptoms of infection. Adlene Adduci McClintock, RN    

## 2015-09-28 ENCOUNTER — Institutional Professional Consult (permissible substitution): Payer: 59 | Admitting: Neurology

## 2015-10-16 ENCOUNTER — Other Ambulatory Visit: Payer: Self-pay | Admitting: Physician Assistant

## 2015-10-18 ENCOUNTER — Other Ambulatory Visit: Payer: Self-pay | Admitting: Physician Assistant

## 2015-10-24 ENCOUNTER — Other Ambulatory Visit: Payer: Self-pay | Admitting: Physician Assistant

## 2015-10-25 ENCOUNTER — Other Ambulatory Visit: Payer: Self-pay | Admitting: Cardiothoracic Surgery

## 2015-10-25 DIAGNOSIS — Z952 Presence of prosthetic heart valve: Secondary | ICD-10-CM

## 2015-10-26 ENCOUNTER — Other Ambulatory Visit: Payer: Self-pay | Admitting: Cardiothoracic Surgery

## 2015-10-26 ENCOUNTER — Encounter: Payer: Self-pay | Admitting: Cardiothoracic Surgery

## 2015-10-26 ENCOUNTER — Ambulatory Visit (INDEPENDENT_AMBULATORY_CARE_PROVIDER_SITE_OTHER): Payer: Self-pay | Admitting: Cardiothoracic Surgery

## 2015-10-26 ENCOUNTER — Ambulatory Visit
Admission: RE | Admit: 2015-10-26 | Discharge: 2015-10-26 | Disposition: A | Discharge status: Home / Self Care | Payer: 59 | Source: Ambulatory Visit | Attending: Cardiothoracic Surgery | Admitting: Cardiothoracic Surgery

## 2015-10-26 VITALS — BP 115/70 | HR 55 | Resp 16 | Ht 72.0 in | Wt 174.0 lb

## 2015-10-26 DIAGNOSIS — I4819 Other persistent atrial fibrillation: Secondary | ICD-10-CM

## 2015-10-26 DIAGNOSIS — Z952 Presence of prosthetic heart valve: Secondary | ICD-10-CM

## 2015-10-26 DIAGNOSIS — Z9889 Other specified postprocedural states: Secondary | ICD-10-CM

## 2015-10-26 DIAGNOSIS — Z954 Presence of other heart-valve replacement: Secondary | ICD-10-CM

## 2015-10-26 DIAGNOSIS — Z8679 Personal history of other diseases of the circulatory system: Secondary | ICD-10-CM

## 2015-10-26 DIAGNOSIS — I77810 Thoracic aortic ectasia: Secondary | ICD-10-CM

## 2015-10-26 DIAGNOSIS — I481 Persistent atrial fibrillation: Secondary | ICD-10-CM

## 2015-10-26 DIAGNOSIS — I350 Nonrheumatic aortic (valve) stenosis: Secondary | ICD-10-CM

## 2015-10-26 NOTE — Patient Instructions (Signed)

## 2015-10-26 NOTE — Progress Notes (Signed)
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 POSTOPERATIVE DIAGNOSES: same SURGICAL PROCEDURE: Biologic replacement of aortic root and aortic valve with biologic Bentall with a 30 mm Gelweave Valsalva graft and [employer] pericardial tissue valve 25 mm model 3300 TFX serial [id number] with reimplantation of right and left coronary arteries and left-sided Maze and placement of atrial clip.  History of Present Illness:     Patient returns to the office today in follow-up after his recent cardiac surgery. He is making good progress at home. He is interested in returning to work in the next week or so. He's had no recurrent angina or evidence of congestive heart failure. His principal complaint currently is a still has trouble sleeping all night.     Past Medical History  Diagnosis Date  . Atrial fibrillation ([facility])   . Aortic regurgitation   . Ascending aorta dilatation ([facility])   . Pneumonia      History  Smoking status  . Never Smoker   Smokeless tobacco  . Never Used    History  Alcohol Use  . 0.0 oz/week  . 0 Standard drinks or equivalent per week    Comment: occasional     No Known Allergies  Current Outpatient Prescriptions  Medication Sig Dispense Refill  . amiodarone (PACERONE) 200 MG tablet Take 1 tablet (200 mg total) by mouth 2 (two) times daily. For one week;then take Amiodarone 200 mg daily by mouth thereafter 30 tablet 1  . aspirin EC 81 MG tablet Take 81 mg by mouth every evening.     . clotrimazole-betamethasone (LOTRISONE) cream Apply 1 application topically daily as needed (rash).    1  . digoxin (LANOXIN) 0.125 MG tablet Take 1 tablet (0.125 mg total) by mouth daily. 30 tablet 1  . diltiazem (CARDIZEM CD) 120 MG 24 hr capsule Take 1 capsule (120 mg total) by mouth daily. 30 capsule 1  . metoprolol tartrate (LOPRESSOR) 25 MG tablet Take 1 tablet (25 mg total) by mouth 2 (two) times daily. 60 tablet 1  . warfarin (COUMADIN) 1 MG tablet Take 1 tablet (1 mg total) by mouth daily at 6 PM. Or as directed by Dr. Einar Gip 30 tablet 1   No current facility-administered medications for this visit.       Physical Exam: BP 115/70 mmHg  Pulse 55  Resp 16  Ht 6' (1.829 m)  Wt 174 lb (78.926 kg)  BMI 23.59 kg/m2  SpO2 98%  General appearance: alert, cooperative and no distress Neurologic: intact Heart: regular rate and rhythm, S1, S2 normal, no murmur, click, rub or gallop Lungs: clear to auscultation bilaterally Abdomen: soft, non-tender; bowel sounds normal; no masses,  no organomegaly Extremities: extremities normal, atraumatic, no cyanosis or edema and Homans sign is negative, no sign of DVT Wound: sternum stable well healed    Diagnostic Studies & Laboratory data:     Recent Radiology Findings:   Dg Chest 2 View  10/26/2015  CLINICAL DATA:  History of aortic valve replacement and Maze procedure on September 20, 2015; the patient is  having deep inspiratory chest tightness over the past 3-4 days. EXAM: CHEST  2 VIEW COMPARISON:  PA and lateral chest x-ray of September 24, 2015 FINDINGS: The lungs are well-expanded. A less than 5% left apical pneumothorax cannot be excluded. The pleural effusions have nearly totally resolved with only a trace of fluid remaining on the left. The heart and pulmonary vascularity are normal. The left atrial appendage clip is unchanged in position. The prosthetic aortic valve ring appears stable. The retrosternal soft tissues are normal. There are 7 intact sternal wires. IMPRESSION: 1. Possible less than 5% left apical pneumothorax. This merits  further evaluation with an in and expiratory PA chest x-ray. 2. Interval near total resolution of the bilateral pleural effusions. There is no evidence of CHF nor pneumonia. 3. These results were called by telephone at the time of interpretation on 10/26/2015 at 1:09 pm to the receptionist, who verbally acknowledged these results. She is implementing the order for an end expiratory chest x-ray. Electronically Signed   By: [name] M.D.   On: 10/26/2015 13:12      Recent Lab Findings: Lab Results  Component Value Date   WBC 11.0* 09/25/2015   HGB 10.3* 09/25/2015   HCT 31.5* 09/25/2015   PLT 207 09/25/2015   GLUCOSE 114* 09/25/2015   ALT 307* 09/26/2015   AST 89* 09/26/2015   NA 135 09/25/2015   K 3.9 09/25/2015   CL 97* 09/25/2015   CREATININE 1.10 09/25/2015   BUN 19 09/25/2015   CO2 28 09/25/2015   INR 2.46* 09/26/2015   HGBA1C 5.8* 09/18/2015      Assessment / Plan:     Doing well following aortic root replacement with biologic Bentall. - Patient's to see cardiology tomorrow with follow-up EKG to evaluate his  atrial fibrillation. I discussed with him returning to work, starting in cardiac rehabilitation next week, and the need for lifelong periprocedural antibiotics for and the carditis prophylaxis. Currently he is on Coumadin monitored by Dr. Mila Palmer office notes his most recent INR several days ago was 2.2. I plan to see him back again in approximate 4 weeks, and at that time ranges schedule for follow-up CTA of the chest. Overall am pleased with his progress   [name] MD      [address] [place] [address] Office [phone]   Beeper [phone]  10/26/2015 2:17 PM

## 2015-11-02 ENCOUNTER — Encounter (HOSPITAL_COMMUNITY)
Admission: RE | Admit: 2015-11-02 | Discharge: 2015-11-02 | Disposition: A | Discharge status: Home / Self Care | Payer: 59 | Source: Ambulatory Visit | Attending: Cardiology | Admitting: Cardiology

## 2015-11-02 DIAGNOSIS — Z954 Presence of other heart-valve replacement: Secondary | ICD-10-CM | POA: Insufficient documentation

## 2015-11-02 DIAGNOSIS — Z95828 Presence of other vascular implants and grafts: Secondary | ICD-10-CM | POA: Insufficient documentation

## 2015-11-02 NOTE — Progress Notes (Signed)
Cardiac Rehab Medication Review by a Pharmacist  Does the patient  feel that his/her medications are working for him/her?  yes  Has the patient been experiencing any side effects to the medications prescribed?  no  Does the patient measure his/her own blood pressure or blood glucose at home?  yes   Does the patient have any problems obtaining medications due to transportation or finances?   no  Understanding of regimen: excellent Understanding of indications: good Potential of compliance: excellent    Pharmacist comments: 64 y/o male presents to cardiac rehab in a good spirits and no distress ambulating on their own. Reviewed patients medications. Pt is tolerating medication regimen well with few side effects and endorses excellent adherence. Pt spoke about some orthostatic hypotension a few weeks ago that has resolved. Counseled pt on the signs of this and to follow up if it continues.   Angela Burke, PharmD Pharmacy Resident Pager: [phone] 11/02/2015 8:07 AM

## 2015-11-06 ENCOUNTER — Encounter (HOSPITAL_COMMUNITY)
Admission: RE | Admit: 2015-11-06 | Discharge: 2015-11-06 | Disposition: A | Discharge status: Home / Self Care | Payer: 59 | Source: Ambulatory Visit | Attending: Cardiology | Admitting: Cardiology

## 2015-11-06 ENCOUNTER — Encounter (HOSPITAL_COMMUNITY): Payer: Self-pay

## 2015-11-06 DIAGNOSIS — Z954 Presence of other heart-valve replacement: Secondary | ICD-10-CM | POA: Diagnosis present

## 2015-11-06 DIAGNOSIS — Z95828 Presence of other vascular implants and grafts: Secondary | ICD-10-CM | POA: Diagnosis not present

## 2015-11-06 NOTE — Progress Notes (Signed)
Pt started cardiac rehab today.  Pt tolerated light exercise without difficulty. VSS, telemetry-sinus rhythm, asymptomatic.  Medication list reconciled.  Pt verbalized compliance with medications and denies barriers to compliance. PSYCHOSOCIAL ASSESSMENT:  PHQ-0. Pt exhibits positive coping skills, hopeful outlook with supportive family. No psychosocial needs identified at this time, no psychosocial interventions necessary.    Pt enjoys participating in and watching sports, outdoor activities and family.   Pt cardiac rehab  goal is  to understand his exercise guidelines and improve his physical conditioning.  Pt encouraged to participate in personalized home exercise and group exercising on your own education classes  to increase ability to achieve these goals.  Pt oriented to exercise equipment and routine.  Understanding verbalized.

## 2015-11-08 ENCOUNTER — Encounter (HOSPITAL_COMMUNITY)
Admission: RE | Admit: 2015-11-08 | Discharge: 2015-11-08 | Disposition: A | Discharge status: Home / Self Care | Payer: 59 | Source: Ambulatory Visit | Attending: Cardiology | Admitting: Cardiology

## 2015-11-08 DIAGNOSIS — Z954 Presence of other heart-valve replacement: Secondary | ICD-10-CM | POA: Diagnosis not present

## 2015-11-10 ENCOUNTER — Encounter (HOSPITAL_COMMUNITY)
Admission: RE | Admit: 2015-11-10 | Discharge: 2015-11-10 | Disposition: A | Discharge status: Home / Self Care | Payer: 59 | Source: Ambulatory Visit | Attending: Cardiology | Admitting: Cardiology

## 2015-11-10 DIAGNOSIS — Z954 Presence of other heart-valve replacement: Secondary | ICD-10-CM | POA: Diagnosis not present

## 2015-11-10 NOTE — Progress Notes (Signed)
QUALITY OF LIFE SCORE REVIEW  Pt completed Quality of Life survey as a participant in Cardiac Rehab.pt scores are very good.  Pt is former Systems analyst.  Offered emotional support and reassurance.  Will continue to monitor and intervene as necessary.

## 2015-11-13 NOTE — H&P (Signed)
OFFICE VISIT NOTES COPIED TO EPIC FOR DOCUMENTATION Chad Lawrence 10/06/2015 9:36 AM Location: [facility] Patient #: [id number] DOB: 1951/07/22 Married / Language: English / Race: White Male   History of Present Illness Chad Page MD; 10/07/2015 4:15 PM) Patient words: Patient is here for a walk in- acute visit with complaints of swelling around incision site and an INR check.  The patient is a 63 year old male who presents for a follow-up for Follow-up for Atrial fibrillation.  Additional reasons for visit:  Follow-up for Thoracic aortic aneurysm is described as the following: He was initially referred to [place] for evaluation of new onset of atrial fibrillation. Echocardiogram on 09/06/2015 activated severe ascending aortic aneurysm, hence he underwent coronary angiography which revealed mild coronary ACEI and aneurysm formation but otherwise no significant coronary artery disease. He underwent Bentall procedure with replacement of aortic valve, LAA ligation and modified maze procedure and reimplantation of his coronary arteries on 09/20/2015 by Dr. Pia Mau, postoperatively he continued to be in A. fib, but had developed RVR, eventually discharged home on beta blocker, calcium channel blocker, amiodarone and digoxin. He has done well and last evening noticed swellin on his incision and wanted to be seen urgenty and presents here for follow-up.  Otherwise he has noticed improvement in energy levels, leg edema has essentially resolved, no PND or orthopnea. No chest pain. Denies any history of hypertension, diabetes, hyperlipidemia, or thyroid disease. Tolerating coumadin well.   Problem List/Past Medical Rosanne Gutting; 10/06/2015 9:40 AM) New onset atrial fibrillation (I48.91) Coronary angiogram 09/12/2015: Ectatic to mildly aneurysmal proximal LAD and Mid Cx. 30% mid RCA stenosis. Labs 09/07/2015: Creatinine 1.01, potassium 4.2, BMP normal, CBC normal, PT 11.0,  INR 1.1 Labs 08/16/2015: Labs 08/16/2015: Creatinine 0.90, potassium 4.1, CMP normal, CBC normal, TSH 2.355 Snoring (R06.83) Bruit (R09.89) Long-term (current) use of anticoagulants (Coumadin) (V58.61) (Z79.01) H/O thoracic aortic aneurysm repair (Z98.890)09/20/2015 H/O ascending aortic aneurysm repair Bentall procedure with 30 mm Gelweave Valsalva graft and [employer] pericardial tissue valve 25 mm model 3300 TFX with reimplantation of right and left coronary arteries and left-sided Maze and placement of atrial clip by Dr. Servando Snare on 09/20/2015 for a 6.8 cm ascending aortic aneurysm with severe aortic insufficiency and atrial fibrillation which was new discovery.  Allergies Rosanne Gutting; 10/06/2015 9:40 AM) No Known Drug Allergies10/08/2015  Family History Rosanne Gutting; 10/06/2015 9:40 AM) Mother In stable health. 25 yrs old; HTN, no other cardiovascular conditions; no heart attack or stroke; Father Deceased. at 11 yrs. H/O thoracic aortic aneurysm and died with rupture  Social History Rosanne Gutting; 10/06/2015 9:40 AM) Current tobacco use Never smoker. Marital status Married. Living Situation Lives with spouse. Number of Children 5.  Past Surgical History Rosanne Gutting; 10/06/2015 9:40 AM) Hernia [id number] Aortic Repair11/12/2014 Biologic replacement of aortic root and aortic valve with biologic Bentall with a 30 mm Gelweave Valsalva graft and [employer] pericardial tissue valve 25 mm model 3300 TFX serial [id number] with reimplantation of right and left coronary arteries and left-sided Maze and placement of atrial clip.  Medication History Adonis Brook Beane; 10/06/2015 10:33 AM) Warfarin Sodium (1MG  Tablet, 2mg  daily Oral daily, Taken starting 10/03/2015) Active. Metoprolol Tartrate (25MG  Tablet, 1 Oral two times daily) Active. Potassium Chloride Crys ER (20MEQ Tablet ER, 1 Oral daily) Active. Furosemide (40MG  Tablet, 1 Oral daily) Active. (for 1  week) Amiodarone HCl (200MG  Tablet, 1 Oral two times daily) Active. (two times daily until 09-28-15 then one daily) Digox (125MCG Tablet, 1 Oral  daily) Active. DiltiaZEM HCl ER Coated Beads (120MG  Capsule ER 24HR, 1 Oral daily) Active. Aspirin (81MG  Tablet, 1 Oral daily) Active. Medications Reconciled (Verbally with patient.)  Diagnostic Studies History Rosanne Gutting; 2015-10-07 9:40 AM) Colonoscopy2013 Echocardiogram10/18/2016 Left ventricle cavity is normal in size. Mild to moderate concentric hypertrophy of the left ventricle. Normal global wall motion. Calculated EF 65%. Aortic valve is tricuspid. Moderate to severe aortic regurgitation. 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 Severely dilated ascending aorta. Carotid Doppler10/20/2016 No hemodynamically significant arterial disease in the internal carotid artery bilaterally. Coronary Angiogram10/25/2016 Ectatic to mildly aneurysmal proximal LAD and Mid Cx. 30% mid RCA stenosis.    Review of Systems Chad Page MD; 10/07/2015 4:16 PM) General Not Present- Anorexia, Fatigue and Fever. Respiratory Not Present- Cough, Decreased Exercise Tolerance, Dyspnea, Snoring and Wheezing. Cardiovascular Not Present- Chest Pain, Claudications, Edema, Orthopnea, Paroxysmal Nocturnal Dyspnea and Shortness of Breath. Gastrointestinal Not Present- Change in Bowel Habits, Constipation and Nausea. Neurological Not Present- Dizziness and Focal Neurological Symptoms. Endocrine Not Present- Appetite Changes, Cold Intolerance and Heat Intolerance. Hematology Not Present- Anemia, Petechiae and Prolonged Bleeding.  Vitals Rosanne Gutting; 2015-10-07 9:49 AM) 2015-10-07 9:40 AM Weight: 191.19 lb Height: 72in Body Surface Area: 2.09 m Body Mass Index: 25.93 kg/m  Pulse: 76 (Regular)  P.OX: 96% (Room air) BP: 104/72 (Sitting, Left Arm, Standard)       Physical Exam Chad Page MD; 10/07/2015 4:16  PM) General Mental Status-Alert. General Appearance-Cooperative, Appears stated age, Not in acute distress. Orientation-Oriented X3. Build & Nutrition-Well nourished and Moderately built.  Head and Neck Thyroid Gland Characteristics - no palpable nodules, no palpable enlargement.  Chest and Lung Exam Inspection Chest Wall - Scar - CABG/sternotomy scar(healing well, without any hematoma or fluctuation or discharge). Palpation Tender - No chest wall tenderness. Auscultation Breath sounds - Clear.  Cardiovascular Inspection Jugular vein - Right - No Distention. Auscultation Heart Sounds - S1 WNL, S2 WNL and No gallop present.  Abdomen Palpation/Percussion Normal exam - Non Tender and No hepatosplenomegaly. Auscultation Normal exam - Bowel sounds normal.  Peripheral Vascular Lower Extremity Inspection - Left - No Pigmentation, No Varicose veins. Right - No Pigmentation, No Varicose veins. Palpation - Edema - Left - No edema. Right - No edema. Femoral pulse - Left - Normal. Right - Normal. Popliteal pulse - Left - Normal. Right - Normal. Dorsalis pedis pulse - Left - Normal. Right - Normal. Posterior tibial pulse - Left - Normal. Right - Normal. Carotid arteries - Left-No Carotid bruit. Carotid arteries - Right-No Carotid bruit. Abdomen-No prominent abdominal aortic pulsation, No epigastric bruit.  Neurologic Motor-Grossly intact without any focal deficits.  Musculoskeletal Global Assessment Left Lower Extremity - normal range of motion without pain. Right Lower Extremity - normal range of motion without pain.   Results Chad Page MD; 10/07/2015 4:18 PM) Labs  Name Value Range Date INR (In House Flow Sheet) [phone])   Collected: 07-Oct-2015 10:31 AM INR 1.4  Collected: 10/07/15 10:31 AM Present Dose 2mg  T,TH; 1mg  All Other Days  Collected: Oct 07, 2015 10:31 AM New Dose 2mg  Daily  Collected: 10-07-15 10:31  AM Goal 2-3 for Atrial Fibrillation  Collected: 2015/10/07 10:31 AM Next INR in Wednesday 10-11-2015  Collected: Oct 07, 2015 10:31 AM    Assessment & Plan Chad Page MD; 10/07/2015 4:18 PM) H/O thoracic aortic aneurysm repair [id number]) Story: H/O ascending aortic aneurysm repair Bentall procedure with 30 mm Gelweave Valsalva graft and [employer] pericardial tissue valve 25 mm model  57 TFX with reimplantation of right and left coronary arteries and left-sided Maze and placement of atrial clip by Dr. Servando Snare on 09/20/2015 for a 6.8 cm ascending aortic aneurysm with severe aortic insufficiency and atrial fibrillation which was new discovery. Future Plans 09/14/2015: CT angiogram chest w contrast [id number]) - one time New onset atrial fibrillation (I48.91) Story: Coronary angiogram 09/12/2015: Ectatic to mildly aneurysmal proximal LAD and Mid Cx. 30% mid RCA stenosis.  Labs 09/07/2015: Creatinine 1.01, potassium 4.2, BMP normal, CBC normal, PT 11.0, INR 1.1  Labs 08/16/2015: Labs 08/16/2015: Creatinine 0.90, potassium 4.1, CMP normal, CBC normal, TSH 2.355 Impression: EKG 09/29/2015: Atrial fibrillation with controlled ventricular response at the rate of 72 bpm, normal axis, poor R-wave progression. Low voltage complexes, nonspecific T abnormality. No significant change from 08/28/2015, low voltage new. Long-term (current) use of anticoagulants (Coumadin) (Z79.01) Current Plans INR (In House Flow Sheet) [phone]) Coumadin Clinic counseling: discussed with patient and provided information. Changed Warfarin Sodium 1MG , 2mg  daily Tablet daily, #40, 30 days starting 10/06/2015, No Refill. Mechanism of underlying disease process and action of medications discussed with the patient. I discussed primary/secondary prevention. I have reviewed his INR, readjusted Coumadin dose. With regard to mild swelling around the postoperative site/sternotomy site, I simply reassured him and  advised him that there is no hematoma or fluctuation or evidence of infection. He'll be continued on Coumadin and low Coumadin clinic. I plan on performing cardioversion once his INR is therapeutic for at least 2 weeks. For now he will continue all the medications including warfarin, amiodarone, metoprolol and diltiazem.   Signed by Chad Page, MD (10/07/2015 4:19 PM)

## 2015-11-14 ENCOUNTER — Encounter (HOSPITAL_COMMUNITY): Payer: Self-pay | Admitting: Critical Care Medicine

## 2015-11-14 ENCOUNTER — Encounter (HOSPITAL_COMMUNITY)
Admission: RE | Disposition: A | Discharge status: Home / Self Care | Payer: Self-pay | Source: Ambulatory Visit | Attending: Cardiology

## 2015-11-14 ENCOUNTER — Ambulatory Visit (HOSPITAL_COMMUNITY)
Admission: RE | Admit: 2015-11-14 | Discharge: 2015-11-14 | Disposition: A | Discharge status: Home / Self Care | Payer: 59 | Source: Ambulatory Visit | Attending: Cardiology | Admitting: Cardiology

## 2015-11-14 DIAGNOSIS — Z7901 Long term (current) use of anticoagulants: Secondary | ICD-10-CM | POA: Insufficient documentation

## 2015-11-14 DIAGNOSIS — Z79899 Other long term (current) drug therapy: Secondary | ICD-10-CM | POA: Insufficient documentation

## 2015-11-14 DIAGNOSIS — Z7982 Long term (current) use of aspirin: Secondary | ICD-10-CM | POA: Diagnosis not present

## 2015-11-14 DIAGNOSIS — I4891 Unspecified atrial fibrillation: Secondary | ICD-10-CM | POA: Diagnosis present

## 2015-11-14 DIAGNOSIS — I2510 Atherosclerotic heart disease of native coronary artery without angina pectoris: Secondary | ICD-10-CM | POA: Diagnosis not present

## 2015-11-14 SURGERY — CANCELLED PROCEDURE

## 2015-11-14 MED ORDER — SODIUM CHLORIDE 0.9 % IV SOLN: INTRAVENOUS | Status: DC

## 2015-11-14 NOTE — Progress Notes (Signed)
Patient admitted to Endo unit. Patient in NSR confirmed by Dr. Einar Gip at bedside. Patient spoke with MD and given instructions and walked out.

## 2015-11-15 ENCOUNTER — Encounter (HOSPITAL_COMMUNITY)
Admission: RE | Admit: 2015-11-15 | Discharge: 2015-11-15 | Disposition: A | Discharge status: Home / Self Care | Payer: 59 | Source: Ambulatory Visit | Attending: Cardiology | Admitting: Cardiology

## 2015-11-15 DIAGNOSIS — Z954 Presence of other heart-valve replacement: Secondary | ICD-10-CM | POA: Diagnosis not present

## 2015-11-15 NOTE — Progress Notes (Signed)
Reviewed home exercise with pt today.  Pt plans to walk and attend fitness center at work for exercise.  Reviewed THR, pulse, RPE, sign and symptoms, and when to call 911 or MD.  Pt voiced understanding.   [name] [employer]

## 2015-11-15 NOTE — Progress Notes (Signed)
Flavio remained in Sinus rhythm at cardiac rehab and exercised without difficulty.

## 2015-11-17 ENCOUNTER — Encounter (HOSPITAL_COMMUNITY)
Admission: RE | Admit: 2015-11-17 | Discharge: 2015-11-17 | Disposition: A | Discharge status: Home / Self Care | Payer: 59 | Source: Ambulatory Visit | Attending: Cardiology | Admitting: Cardiology

## 2015-11-17 DIAGNOSIS — Z954 Presence of other heart-valve replacement: Secondary | ICD-10-CM | POA: Diagnosis not present

## 2015-11-18 ENCOUNTER — Other Ambulatory Visit: Payer: Self-pay | Admitting: Physician Assistant

## 2015-11-22 ENCOUNTER — Encounter (HOSPITAL_COMMUNITY): Admission: RE | Admit: 2015-11-22 | Payer: 59 | Source: Ambulatory Visit

## 2015-11-24 ENCOUNTER — Encounter (HOSPITAL_COMMUNITY): Payer: 59

## 2015-11-27 ENCOUNTER — Encounter (HOSPITAL_COMMUNITY)
Admission: RE | Admit: 2015-11-27 | Discharge: 2015-11-27 | Disposition: A | Discharge status: Home / Self Care | Payer: 59 | Source: Ambulatory Visit | Attending: Cardiology | Admitting: Cardiology

## 2015-11-27 DIAGNOSIS — Z95828 Presence of other vascular implants and grafts: Secondary | ICD-10-CM | POA: Insufficient documentation

## 2015-11-27 DIAGNOSIS — Z954 Presence of other heart-valve replacement: Secondary | ICD-10-CM | POA: Diagnosis present

## 2015-11-28 ENCOUNTER — Ambulatory Visit (INDEPENDENT_AMBULATORY_CARE_PROVIDER_SITE_OTHER): Payer: Self-pay | Admitting: Cardiothoracic Surgery

## 2015-11-28 ENCOUNTER — Encounter: Payer: Self-pay | Admitting: Cardiothoracic Surgery

## 2015-11-28 VITALS — BP 127/74 | HR 68 | Resp 16 | Ht 72.0 in | Wt 184.0 lb

## 2015-11-28 DIAGNOSIS — I77810 Thoracic aortic ectasia: Secondary | ICD-10-CM

## 2015-11-28 DIAGNOSIS — Z8679 Personal history of other diseases of the circulatory system: Secondary | ICD-10-CM

## 2015-11-28 DIAGNOSIS — I481 Persistent atrial fibrillation: Secondary | ICD-10-CM

## 2015-11-28 DIAGNOSIS — I350 Nonrheumatic aortic (valve) stenosis: Secondary | ICD-10-CM

## 2015-11-28 DIAGNOSIS — I4819 Other persistent atrial fibrillation: Secondary | ICD-10-CM

## 2015-11-28 DIAGNOSIS — Z954 Presence of other heart-valve replacement: Secondary | ICD-10-CM

## 2015-11-28 DIAGNOSIS — Z952 Presence of prosthetic heart valve: Secondary | ICD-10-CM

## 2015-11-28 DIAGNOSIS — Z9889 Other specified postprocedural states: Secondary | ICD-10-CM

## 2015-11-28 NOTE — Patient Instructions (Signed)

## 2015-11-28 NOTE — Progress Notes (Signed)
[address]       [place] [address]             [phone]      Chad Lawrence [facility] Medical Record [id number] Date of Birth: September 04, 1951  Referring: Adrian Prows, MD Primary Care: [name], MD  Chief Complaint:   POST OP FOLLOW UP 09/20/2015 PREOPERATIVE DIAGNOSES: 6.8 cm dilated aortic root with severe aortic insufficiency and atrial fibrillation. POSTOPERATIVE DIAGNOSES: same SURGICAL PROCEDURE: Biologic replacement of aortic root and aortic valve with biologic Bentall with a 30 mm Gelweave Valsalva graft and [employer] pericardial tissue valve 25 mm model 3300 TFX serial [id number] with reimplantation of right and left coronary arteries and left-sided Maze and placement of atrial clip.  History of Present Illness:     Patient returns to the office today in follow-up after his  cardiac surgery in November. He returned to near normal activity. He has returned to work  He's had no recurrent angina or evidence of congestive heart failure. He comes into, rhythm strip shows sinus rhythm. He noted that he was scheduled a cardioversion by Dr Einar Gip several weeks ago and when he came was found to be in sinus rhythm.   Past Medical History  Diagnosis Date  . Atrial fibrillation ([place])   . Aortic regurgitation   . Ascending aorta dilatation ([facility])   . Pneumonia      History  Smoking status  . Never Smoker   Smokeless tobacco  . Never Used    History  Alcohol Use  . 0.0 oz/week  . 0 Standard drinks or equivalent per week    Comment: occasional     No Known Allergies  Current Outpatient Prescriptions  Medication Sig Dispense Refill  . amiodarone (PACERONE) 200 MG tablet Take 1 tablet (200 mg total) by mouth 2 (two) times daily. For one week;then take Amiodarone 200 mg daily by mouth thereafter (Patient taking differently: Take 200 mg by mouth 2 (two) times daily. 200mg  daily) 30 tablet 1  . aspirin EC 81 MG tablet Take 81 mg  by mouth every morning.     . clotrimazole-betamethasone (LOTRISONE) cream Apply 1 application topically daily as needed (rash).   1  . digoxin (LANOXIN) 0.125 MG tablet Take 1 tablet (0.125 mg total) by mouth daily. 30 tablet 1  . diltiazem (CARDIZEM CD) 120 MG 24 hr capsule Take 1 capsule (120 mg total) by mouth daily. 30 capsule 1  . metoprolol succinate (TOPROL-XL) 25 MG 24 hr tablet Take 25 mg by mouth daily.    Marland Kitchen warfarin (COUMADIN) 1 MG tablet Take 1 tablet (1 mg total) by mouth daily at 6 PM. Or as directed by Dr. Einar Gip (Patient taking differently: Take 2 mg by mouth daily at 6 PM. Or as directed by Dr. Einar Gip) 30 tablet 1   No current facility-administered medications for this visit.       Physical Exam: BP 127/74 mmHg  Pulse 68  Resp 16  Ht 6' (1.829 m)  Wt 184 lb (83.462 kg)  BMI 24.95 kg/m2  SpO2 98%  General appearance: alert, cooperative and no distress Neurologic: intact Heart: regular rate and rhythm, S1, S2 normal, no murmur, click, rub or gallop Lungs: clear to auscultation bilaterally Abdomen: soft, non-tender; bowel sounds normal; no masses,  no organomegaly Extremities: extremities normal, atraumatic, no cyanosis or edema and Homans sign is negative, no sign of DVT Wound: sternum stable well healed    Diagnostic Studies &  Laboratory data:     Recent Radiology Findings:   No results found.    Recent Lab Findings: Lab Results  Component Value Date   WBC 11.0* 09/25/2015   HGB 10.3* 09/25/2015   HCT 31.5* 09/25/2015   PLT 207 09/25/2015   GLUCOSE 114* 09/25/2015   ALT 307* 09/26/2015   AST 89* 09/26/2015   NA 135 09/25/2015   K 3.9 09/25/2015   CL 97* 09/25/2015   CREATININE 1.10 09/25/2015   BUN 19 09/25/2015   CO2 28 09/25/2015   INR 2.46* 09/26/2015   HGBA1C 5.8* 09/18/2015      Assessment / Plan:   Doing well following aortic root replacement with biologic Bentall. - In sinus rhythm today to evaluate his  atrial fibrillation. Patient  to discuss with Dr Einar Gip when to stop Amiodarone and check follow up lfts and tsh  I plan to see patient back in one year with CTA of chest  With the patient's prosthetic heart valve the risks of endocarditis have been discussed. The recommendations for periprocedural antibiotics including dental procedures have been discussed with the patient.   [name] MD      [address] ,[place] [address] Office [phone]   Beeper [phone]  11/28/2015 3:51 PM

## 2015-11-29 ENCOUNTER — Encounter (HOSPITAL_COMMUNITY)
Admission: RE | Admit: 2015-11-29 | Discharge: 2015-11-29 | Disposition: A | Discharge status: Home / Self Care | Payer: 59 | Source: Ambulatory Visit | Attending: Cardiology | Admitting: Cardiology

## 2015-11-29 DIAGNOSIS — Z954 Presence of other heart-valve replacement: Secondary | ICD-10-CM | POA: Diagnosis not present

## 2015-12-01 ENCOUNTER — Encounter (HOSPITAL_COMMUNITY)
Admission: RE | Admit: 2015-12-01 | Discharge: 2015-12-01 | Disposition: A | Discharge status: Home / Self Care | Payer: 59 | Source: Ambulatory Visit | Attending: Cardiology | Admitting: Cardiology

## 2015-12-01 DIAGNOSIS — Z954 Presence of other heart-valve replacement: Secondary | ICD-10-CM | POA: Diagnosis not present

## 2015-12-04 ENCOUNTER — Encounter (HOSPITAL_COMMUNITY)
Admission: RE | Admit: 2015-12-04 | Discharge: 2015-12-04 | Disposition: A | Discharge status: Home / Self Care | Payer: 59 | Source: Ambulatory Visit | Attending: Cardiology | Admitting: Cardiology

## 2015-12-04 DIAGNOSIS — Z954 Presence of other heart-valve replacement: Secondary | ICD-10-CM | POA: Diagnosis not present

## 2015-12-06 ENCOUNTER — Encounter (HOSPITAL_COMMUNITY)
Admission: RE | Admit: 2015-12-06 | Discharge: 2015-12-06 | Disposition: A | Discharge status: Home / Self Care | Payer: 59 | Source: Ambulatory Visit | Attending: Cardiology | Admitting: Cardiology

## 2015-12-06 DIAGNOSIS — Z954 Presence of other heart-valve replacement: Secondary | ICD-10-CM | POA: Diagnosis not present

## 2015-12-08 ENCOUNTER — Encounter (HOSPITAL_COMMUNITY)
Admission: RE | Admit: 2015-12-08 | Discharge: 2015-12-08 | Disposition: A | Discharge status: Home / Self Care | Payer: 59 | Source: Ambulatory Visit | Attending: Cardiology | Admitting: Cardiology

## 2015-12-08 DIAGNOSIS — Z954 Presence of other heart-valve replacement: Secondary | ICD-10-CM | POA: Diagnosis not present

## 2015-12-11 ENCOUNTER — Encounter (HOSPITAL_COMMUNITY): Payer: 59

## 2015-12-12 ENCOUNTER — Other Ambulatory Visit: Payer: Self-pay | Admitting: Internal Medicine

## 2015-12-12 DIAGNOSIS — E041 Nontoxic single thyroid nodule: Secondary | ICD-10-CM

## 2015-12-13 ENCOUNTER — Encounter (HOSPITAL_COMMUNITY): Payer: 59

## 2015-12-15 ENCOUNTER — Encounter (HOSPITAL_COMMUNITY)
Admission: RE | Admit: 2015-12-15 | Discharge: 2015-12-15 | Disposition: A | Discharge status: Home / Self Care | Payer: 59 | Source: Ambulatory Visit | Attending: Cardiology | Admitting: Cardiology

## 2015-12-15 DIAGNOSIS — Z954 Presence of other heart-valve replacement: Secondary | ICD-10-CM | POA: Diagnosis not present

## 2015-12-15 NOTE — Progress Notes (Signed)
Chad Lawrence 65 y.o. male Nutrition Note Spoke with pt.  Nutrition Survey reviewed with pt. Pt is following Step 1 of the Therapeutic Lifestyle Changes diet. Pt states his Coumadin has been discontinued. Pt is pre-diabetic according to his last A1c. Pt was aware of pre-diabetes. Pre-diabetes discussed.  Pt expressed understanding of the information reviewed. Pt aware of nutrition education classes offered and plans on attending nutrition classes. Lab Results  Component Value Date   HGBA1C 5.8* 09/18/2015   Wt Readings from Last 3 Encounters:  11/28/15 184 lb (83.462 kg)  11/02/15 180 lb 12.4 oz (82 kg)  10/26/15 174 lb (78.926 kg)   Nutrition Diagnosis ? Food-and nutrition-related knowledge deficit related to lack of exposure to information as related to diagnosis of: ? CVD ? Pre-DM  Nutrition Intervention ? Benefits of adopting Therapeutic Lifestyle Changes discussed when Medficts reviewed. ? Pt to attend the Portion Distortion class ? Pt to attend the  ? Nutrition I class                      ? Nutrition II class ? Continue client-centered nutrition education by RD, as part of interdisciplinary care.  Goal(s) ? Pt to identify and limit food sources of saturated fat, trans fat, and sodium  Monitor and Evaluate progress toward nutrition goal with team.  Chad Lawrence, M.Ed, RD, LDN, CDE 12/15/2015 2:56 PM

## 2015-12-18 ENCOUNTER — Encounter (HOSPITAL_COMMUNITY)
Admission: RE | Admit: 2015-12-18 | Discharge: 2015-12-18 | Disposition: A | Discharge status: Home / Self Care | Payer: 59 | Source: Ambulatory Visit | Attending: Cardiology | Admitting: Cardiology

## 2015-12-18 ENCOUNTER — Encounter (HOSPITAL_COMMUNITY): Payer: 59

## 2015-12-18 DIAGNOSIS — Z954 Presence of other heart-valve replacement: Secondary | ICD-10-CM | POA: Diagnosis not present

## 2015-12-20 ENCOUNTER — Encounter (HOSPITAL_COMMUNITY)
Admission: RE | Admit: 2015-12-20 | Discharge: 2015-12-20 | Disposition: A | Discharge status: Home / Self Care | Payer: 59 | Source: Ambulatory Visit | Attending: Cardiology | Admitting: Cardiology

## 2015-12-20 ENCOUNTER — Encounter (HOSPITAL_COMMUNITY): Payer: 59

## 2015-12-20 DIAGNOSIS — Z95828 Presence of other vascular implants and grafts: Secondary | ICD-10-CM | POA: Insufficient documentation

## 2015-12-20 DIAGNOSIS — Z954 Presence of other heart-valve replacement: Secondary | ICD-10-CM | POA: Insufficient documentation

## 2015-12-22 ENCOUNTER — Encounter (HOSPITAL_COMMUNITY)
Admission: RE | Admit: 2015-12-22 | Discharge: 2015-12-22 | Disposition: A | Discharge status: Home / Self Care | Payer: 59 | Source: Ambulatory Visit | Attending: Cardiology | Admitting: Cardiology

## 2015-12-22 ENCOUNTER — Encounter (HOSPITAL_COMMUNITY): Payer: 59

## 2015-12-22 DIAGNOSIS — Z954 Presence of other heart-valve replacement: Secondary | ICD-10-CM | POA: Diagnosis not present

## 2015-12-25 ENCOUNTER — Encounter (HOSPITAL_COMMUNITY): Payer: 59

## 2015-12-25 ENCOUNTER — Encounter (HOSPITAL_COMMUNITY)
Admission: RE | Admit: 2015-12-25 | Discharge: 2015-12-25 | Disposition: A | Discharge status: Home / Self Care | Payer: 59 | Source: Ambulatory Visit | Attending: Cardiology | Admitting: Cardiology

## 2015-12-25 DIAGNOSIS — Z954 Presence of other heart-valve replacement: Secondary | ICD-10-CM | POA: Diagnosis not present

## 2015-12-27 ENCOUNTER — Encounter (HOSPITAL_COMMUNITY): Payer: 59

## 2015-12-27 ENCOUNTER — Encounter (HOSPITAL_COMMUNITY)
Admission: RE | Admit: 2015-12-27 | Discharge: 2015-12-27 | Disposition: A | Discharge status: Home / Self Care | Payer: 59 | Source: Ambulatory Visit | Attending: Cardiology | Admitting: Cardiology

## 2015-12-27 DIAGNOSIS — Z954 Presence of other heart-valve replacement: Secondary | ICD-10-CM | POA: Diagnosis not present

## 2015-12-29 ENCOUNTER — Encounter (HOSPITAL_COMMUNITY)
Admission: RE | Admit: 2015-12-29 | Discharge: 2015-12-29 | Disposition: A | Discharge status: Home / Self Care | Payer: 59 | Source: Ambulatory Visit | Attending: Cardiology | Admitting: Cardiology

## 2015-12-29 ENCOUNTER — Encounter (HOSPITAL_COMMUNITY): Payer: 59

## 2015-12-29 DIAGNOSIS — Z954 Presence of other heart-valve replacement: Secondary | ICD-10-CM | POA: Diagnosis not present

## 2016-01-01 ENCOUNTER — Encounter (HOSPITAL_COMMUNITY)
Admission: RE | Admit: 2016-01-01 | Discharge: 2016-01-01 | Disposition: A | Discharge status: Home / Self Care | Payer: 59 | Source: Ambulatory Visit | Attending: Cardiology | Admitting: Cardiology

## 2016-01-01 ENCOUNTER — Encounter (HOSPITAL_COMMUNITY): Payer: 59

## 2016-01-01 DIAGNOSIS — Z954 Presence of other heart-valve replacement: Secondary | ICD-10-CM | POA: Diagnosis not present

## 2016-01-03 ENCOUNTER — Encounter (HOSPITAL_COMMUNITY): Payer: 59

## 2016-01-03 ENCOUNTER — Other Ambulatory Visit: Payer: 59

## 2016-01-05 ENCOUNTER — Encounter (HOSPITAL_COMMUNITY)
Admission: RE | Admit: 2016-01-05 | Discharge: 2016-01-05 | Disposition: A | Discharge status: Home / Self Care | Payer: 59 | Source: Ambulatory Visit | Attending: Cardiology | Admitting: Cardiology

## 2016-01-05 ENCOUNTER — Encounter (HOSPITAL_COMMUNITY): Payer: 59

## 2016-01-05 DIAGNOSIS — Z954 Presence of other heart-valve replacement: Secondary | ICD-10-CM | POA: Diagnosis not present

## 2016-01-08 ENCOUNTER — Encounter (HOSPITAL_COMMUNITY): Payer: 59

## 2016-01-08 ENCOUNTER — Encounter (HOSPITAL_COMMUNITY)
Admission: RE | Admit: 2016-01-08 | Discharge: 2016-01-08 | Disposition: A | Discharge status: Home / Self Care | Payer: 59 | Source: Ambulatory Visit | Attending: Cardiology | Admitting: Cardiology

## 2016-01-08 DIAGNOSIS — Z954 Presence of other heart-valve replacement: Secondary | ICD-10-CM | POA: Diagnosis not present

## 2016-01-09 ENCOUNTER — Other Ambulatory Visit (HOSPITAL_COMMUNITY)
Admission: RE | Admit: 2016-01-09 | Discharge: 2016-01-09 | Disposition: A | Discharge status: Home / Self Care | Payer: 59 | Source: Ambulatory Visit | Attending: Interventional Radiology | Admitting: Interventional Radiology

## 2016-01-09 ENCOUNTER — Ambulatory Visit
Admission: RE | Admit: 2016-01-09 | Discharge: 2016-01-09 | Disposition: A | Discharge status: Home / Self Care | Payer: 59 | Source: Ambulatory Visit | Attending: Internal Medicine | Admitting: Internal Medicine

## 2016-01-09 DIAGNOSIS — E041 Nontoxic single thyroid nodule: Secondary | ICD-10-CM | POA: Diagnosis present

## 2016-01-10 ENCOUNTER — Encounter (HOSPITAL_COMMUNITY)
Admission: RE | Admit: 2016-01-10 | Discharge: 2016-01-10 | Disposition: A | Discharge status: Home / Self Care | Payer: 59 | Source: Ambulatory Visit | Attending: Cardiology | Admitting: Cardiology

## 2016-01-10 ENCOUNTER — Encounter (HOSPITAL_COMMUNITY): Payer: 59

## 2016-01-10 DIAGNOSIS — Z954 Presence of other heart-valve replacement: Secondary | ICD-10-CM | POA: Diagnosis not present

## 2016-01-12 ENCOUNTER — Encounter (HOSPITAL_COMMUNITY): Payer: 59

## 2016-01-12 ENCOUNTER — Encounter (HOSPITAL_COMMUNITY)
Admission: RE | Admit: 2016-01-12 | Discharge: 2016-01-12 | Disposition: A | Discharge status: Home / Self Care | Payer: 59 | Source: Ambulatory Visit | Attending: Cardiology | Admitting: Cardiology

## 2016-01-12 DIAGNOSIS — Z954 Presence of other heart-valve replacement: Secondary | ICD-10-CM | POA: Diagnosis not present

## 2016-01-15 ENCOUNTER — Encounter (HOSPITAL_COMMUNITY)
Admission: RE | Admit: 2016-01-15 | Discharge: 2016-01-15 | Disposition: A | Discharge status: Home / Self Care | Payer: 59 | Source: Ambulatory Visit | Attending: Cardiology | Admitting: Cardiology

## 2016-01-15 ENCOUNTER — Encounter (HOSPITAL_COMMUNITY): Payer: 59

## 2016-01-15 DIAGNOSIS — Z954 Presence of other heart-valve replacement: Secondary | ICD-10-CM | POA: Diagnosis not present

## 2016-01-17 ENCOUNTER — Encounter (HOSPITAL_COMMUNITY): Payer: 59

## 2016-01-17 ENCOUNTER — Encounter (HOSPITAL_COMMUNITY)
Admission: RE | Admit: 2016-01-17 | Discharge: 2016-01-17 | Disposition: A | Discharge status: Home / Self Care | Payer: 59 | Source: Ambulatory Visit | Attending: Cardiology | Admitting: Cardiology

## 2016-01-17 DIAGNOSIS — Z95828 Presence of other vascular implants and grafts: Secondary | ICD-10-CM | POA: Insufficient documentation

## 2016-01-17 DIAGNOSIS — Z954 Presence of other heart-valve replacement: Secondary | ICD-10-CM | POA: Insufficient documentation

## 2016-01-19 ENCOUNTER — Encounter (HOSPITAL_COMMUNITY): Payer: 59

## 2016-01-19 ENCOUNTER — Encounter (HOSPITAL_COMMUNITY)
Admission: RE | Admit: 2016-01-19 | Discharge: 2016-01-19 | Disposition: A | Discharge status: Home / Self Care | Payer: 59 | Source: Ambulatory Visit | Attending: Cardiology | Admitting: Cardiology

## 2016-01-19 DIAGNOSIS — Z954 Presence of other heart-valve replacement: Secondary | ICD-10-CM | POA: Diagnosis not present

## 2016-01-22 ENCOUNTER — Encounter (HOSPITAL_COMMUNITY): Payer: 59

## 2016-01-24 ENCOUNTER — Encounter (HOSPITAL_COMMUNITY)
Admission: RE | Admit: 2016-01-24 | Discharge: 2016-01-24 | Disposition: A | Discharge status: Home / Self Care | Payer: 59 | Source: Ambulatory Visit | Attending: Cardiology | Admitting: Cardiology

## 2016-01-24 ENCOUNTER — Encounter (HOSPITAL_COMMUNITY): Payer: 59

## 2016-01-24 DIAGNOSIS — Z954 Presence of other heart-valve replacement: Secondary | ICD-10-CM | POA: Diagnosis not present

## 2016-01-26 ENCOUNTER — Encounter (HOSPITAL_COMMUNITY): Payer: 59

## 2016-01-26 ENCOUNTER — Encounter (HOSPITAL_COMMUNITY)
Admission: RE | Admit: 2016-01-26 | Discharge: 2016-01-26 | Disposition: A | Discharge status: Home / Self Care | Payer: 59 | Source: Ambulatory Visit | Attending: Cardiology | Admitting: Cardiology

## 2016-01-26 DIAGNOSIS — Z954 Presence of other heart-valve replacement: Secondary | ICD-10-CM | POA: Diagnosis not present

## 2016-01-29 ENCOUNTER — Encounter (HOSPITAL_COMMUNITY)
Admission: RE | Admit: 2016-01-29 | Discharge: 2016-01-29 | Disposition: A | Discharge status: Home / Self Care | Payer: 59 | Source: Ambulatory Visit | Attending: Cardiology | Admitting: Cardiology

## 2016-01-29 ENCOUNTER — Encounter (HOSPITAL_COMMUNITY): Payer: 59

## 2016-01-29 DIAGNOSIS — Z954 Presence of other heart-valve replacement: Secondary | ICD-10-CM | POA: Diagnosis not present

## 2016-01-29 NOTE — Progress Notes (Signed)
Pt arrived at cardiac rehab reporting medication changes per his cardiologist. Med list reconciled.

## 2016-01-31 ENCOUNTER — Encounter (HOSPITAL_COMMUNITY): Payer: 59

## 2016-02-02 ENCOUNTER — Encounter (HOSPITAL_COMMUNITY)
Admission: RE | Admit: 2016-02-02 | Discharge: 2016-02-02 | Disposition: A | Discharge status: Home / Self Care | Payer: 59 | Source: Ambulatory Visit | Attending: Cardiology | Admitting: Cardiology

## 2016-02-02 ENCOUNTER — Encounter (HOSPITAL_COMMUNITY): Payer: 59

## 2016-02-02 DIAGNOSIS — Z954 Presence of other heart-valve replacement: Secondary | ICD-10-CM | POA: Diagnosis not present

## 2016-02-05 ENCOUNTER — Encounter (HOSPITAL_COMMUNITY)
Admission: RE | Admit: 2016-02-05 | Discharge: 2016-02-05 | Disposition: A | Discharge status: Home / Self Care | Payer: 59 | Source: Ambulatory Visit | Attending: Cardiology | Admitting: Cardiology

## 2016-02-05 ENCOUNTER — Encounter (HOSPITAL_COMMUNITY): Payer: 59

## 2016-02-05 DIAGNOSIS — Z954 Presence of other heart-valve replacement: Secondary | ICD-10-CM | POA: Diagnosis not present

## 2016-02-07 ENCOUNTER — Encounter (HOSPITAL_COMMUNITY): Payer: 59

## 2016-02-09 ENCOUNTER — Encounter (HOSPITAL_COMMUNITY): Payer: 59

## 2016-02-09 ENCOUNTER — Encounter (HOSPITAL_COMMUNITY)
Admission: RE | Admit: 2016-02-09 | Discharge: 2016-02-09 | Disposition: A | Discharge status: Home / Self Care | Payer: 59 | Source: Ambulatory Visit | Attending: Cardiology | Admitting: Cardiology

## 2016-02-09 DIAGNOSIS — Z954 Presence of other heart-valve replacement: Secondary | ICD-10-CM | POA: Diagnosis not present

## 2016-02-12 ENCOUNTER — Encounter (HOSPITAL_COMMUNITY): Payer: 59

## 2016-02-19 ENCOUNTER — Encounter (HOSPITAL_COMMUNITY)
Admission: RE | Admit: 2016-02-19 | Discharge: 2016-02-19 | Disposition: A | Discharge status: Home / Self Care | Payer: 59 | Source: Ambulatory Visit | Attending: Cardiology | Admitting: Cardiology

## 2016-02-19 DIAGNOSIS — Z954 Presence of other heart-valve replacement: Secondary | ICD-10-CM | POA: Diagnosis present

## 2016-02-19 DIAGNOSIS — Z95828 Presence of other vascular implants and grafts: Secondary | ICD-10-CM | POA: Diagnosis not present

## 2016-02-21 ENCOUNTER — Encounter (HOSPITAL_COMMUNITY)
Admission: RE | Admit: 2016-02-21 | Discharge: 2016-02-21 | Disposition: A | Discharge status: Home / Self Care | Payer: 59 | Source: Ambulatory Visit | Attending: Cardiology | Admitting: Cardiology

## 2016-02-21 DIAGNOSIS — Z954 Presence of other heart-valve replacement: Secondary | ICD-10-CM | POA: Diagnosis not present

## 2016-02-23 ENCOUNTER — Encounter (HOSPITAL_COMMUNITY)
Admission: RE | Admit: 2016-02-23 | Discharge: 2016-02-23 | Disposition: A | Discharge status: Home / Self Care | Payer: 59 | Source: Ambulatory Visit | Attending: Cardiology | Admitting: Cardiology

## 2016-02-23 DIAGNOSIS — Z954 Presence of other heart-valve replacement: Secondary | ICD-10-CM | POA: Diagnosis not present

## 2016-02-26 ENCOUNTER — Encounter (HOSPITAL_COMMUNITY)
Admission: RE | Admit: 2016-02-26 | Discharge: 2016-02-26 | Disposition: A | Discharge status: Home / Self Care | Payer: 59 | Source: Ambulatory Visit | Attending: Cardiology | Admitting: Cardiology

## 2016-02-26 ENCOUNTER — Encounter (HOSPITAL_COMMUNITY): Payer: Self-pay

## 2016-02-26 DIAGNOSIS — Z954 Presence of other heart-valve replacement: Secondary | ICD-10-CM | POA: Diagnosis not present

## 2016-02-26 NOTE — Progress Notes (Signed)
Pt graduated from cardiac rehab program today with completion of 36 exercise sessions in Phase II. Pt maintained good attendance and progressed nicely during his participation in rehab as evidenced by increased MET level.   Medication list reconciled. Repeat  PHQ score-0  .  Pt has made significant lifestyle changes and should be commended for his success. Pt feels he has achieved his goals during cardiac rehab, which include restoration of his physical strength and stamina and being able to return to his usual exercise/physical activity routine.   Pt plans to continue exercising on his own.

## 2016-02-28 ENCOUNTER — Encounter (HOSPITAL_COMMUNITY): Payer: 59

## 2016-03-01 ENCOUNTER — Encounter (HOSPITAL_COMMUNITY): Payer: 59

## 2016-11-14 ENCOUNTER — Other Ambulatory Visit: Payer: Self-pay | Admitting: Cardiothoracic Surgery

## 2016-11-14 DIAGNOSIS — Z95828 Presence of other vascular implants and grafts: Secondary | ICD-10-CM

## 2016-12-05 ENCOUNTER — Ambulatory Visit (INDEPENDENT_AMBULATORY_CARE_PROVIDER_SITE_OTHER): Payer: 59 | Admitting: Cardiothoracic Surgery

## 2016-12-05 ENCOUNTER — Ambulatory Visit
Admission: RE | Admit: 2016-12-05 | Discharge: 2016-12-05 | Disposition: A | Discharge status: Home / Self Care | Payer: 59 | Source: Ambulatory Visit | Attending: Cardiothoracic Surgery | Admitting: Cardiothoracic Surgery

## 2016-12-05 ENCOUNTER — Encounter: Payer: Self-pay | Admitting: Cardiothoracic Surgery

## 2016-12-05 VITALS — BP 120/73 | HR 62 | Resp 16 | Ht 72.0 in | Wt 185.0 lb

## 2016-12-05 DIAGNOSIS — Z952 Presence of prosthetic heart valve: Secondary | ICD-10-CM | POA: Diagnosis not present

## 2016-12-05 DIAGNOSIS — Z8679 Personal history of other diseases of the circulatory system: Secondary | ICD-10-CM | POA: Diagnosis not present

## 2016-12-05 DIAGNOSIS — Z9889 Other specified postprocedural states: Secondary | ICD-10-CM | POA: Diagnosis not present

## 2016-12-05 DIAGNOSIS — Z95828 Presence of other vascular implants and grafts: Secondary | ICD-10-CM

## 2016-12-05 MED ORDER — IOPAMIDOL (ISOVUE-370) INJECTION 76%
75.0000 mL | Freq: Once | INTRAVENOUS | Status: AC | PRN
  Administered 2016-12-05: 75 mL via INTRAVENOUS

## 2016-12-05 NOTE — Patient Instructions (Signed)

## 2016-12-05 NOTE — Progress Notes (Signed)
[address]       [place] [address]             [phone]      Chad Lawrence [name] Date of Birth: [birth year]  Referring: Adrian Prows, MD Primary Care: [name], MD  Chief Complaint:   POST OP FOLLOW UP 09/20/2015 PREOPERATIVE DIAGNOSES: 6.8 cm dilated aortic root with severe aortic insufficiency and atrial fibrillation. POSTOPERATIVE DIAGNOSES: same SURGICAL PROCEDURE: Biologic replacement of aortic root and aortic valve with biologic Bentall with a 30 mm Gelweave Valsalva graft and [employer] pericardial tissue valve 25 mm model 3300 TFX serial [id number] with reimplantation of right and left coronary arteries and left-sided Maze and placement of atrial clip.  History of Present Illness:     Patient returns to the office today in follow-up after his  cardiac surgery in November 2016 . Patient returns today with a 1 year oh stop CTA of the chest following replacement of his aortic root. The patient notes that he feels great at this point. He is unaware of any recurrent atrial fibrillation. He has no signs or symptoms of congestive heart failure.     Past Medical History:  Diagnosis Date  . Aortic regurgitation   . Ascending aorta dilatation ([facility])   . Atrial fibrillation ([place])   . Pneumonia      History  Smoking Status  . Never Smoker  Smokeless Tobacco  . Never Used    History  Alcohol Use  . 0.0 oz/week    Comment: occasional     No Known Allergies  Current Outpatient Prescriptions  Medication Sig Dispense Refill  . aspirin EC 81 MG tablet Take 81 mg by mouth every morning.     . metoprolol succinate (TOPROL-XL) 50 MG 24 hr tablet Take 50 mg by mouth daily. Take with or immediately following a meal.    . rosuvastatin (CRESTOR) 10 MG tablet Take 10 mg by mouth daily.     No current facility-administered medications for this visit.        Physical Exam: BP 120/73 (BP  Location: Right Arm, Patient Position: Sitting, Cuff Size: Large)   Pulse 62   Resp 16   Ht 6' (1.829 m)   Wt 185 lb (83.9 kg)   SpO2 98% Comment: ON RA  BMI 25.09 kg/m   General appearance: alert, cooperative and no distress Neurologic: intact Heart: regular rate and rhythm, S1, S2 normal, no murmur, click, rub or gallop Lungs: clear to auscultation bilaterally Abdomen: soft, non-tender; bowel sounds normal; no masses,  no organomegaly Extremities: extremities normal, atraumatic, no cyanosis or edema and Homans sign is negative, no sign of DVT Wound: sternum stable well healed , mild thickening and keloid formation of the lower sternal incision.   Diagnostic Studies & Laboratory data:     Recent Radiology Findings:   Ct Angio Chest Aorta W/cm &/or Wo/cm  Result Date: 12/05/2016 CLINICAL DATA:  Status post Bentall procedure on 09/20/2015. EXAM: CT ANGIOGRAPHY CHEST WITH CONTRAST TECHNIQUE: Multidetector CT imaging of the chest was performed using the standard protocol during bolus administration of intravenous contrast. Multiplanar CT image reconstructions and MIPs were obtained to evaluate the vascular anatomy. CONTRAST:  75 mL Isovue 370 IV Creatinine was obtained on site at [facility] at [address] Results: Creatinine 0.9 mg/dL.  Estimated GFR of 88 mL/minute. COMPARISON:  09/14/2015 FINDINGS: Cardiovascular: Status post replacement of the aortic valve, aortic root  and ascending thoracic aorta with re- implantation of the coronary arteries. The aortic graft shows normal patency without evidence of anastomotic pseudoaneurysm. Valve plane shows no abnormalities. The heart size is stable and within normal limits. Left atrial clip present. No pericardial fluid or mediastinal hemorrhage. The native aortic arch appears stable with proximal arch measuring 3.6 cm and distal arch measuring 3.5 cm. The descending thoracic aorta measures 3.0 cm. No evidence of aortic dissection.  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 No evidence of lymphadenopathy. The trachea and esophagus show no overt abnormalities by CT. Stable appearance of partially solid and partially cystic nodule in the inferior aspect of the right lobe of the thyroid gland measuring approximately 4 cm. Lungs/Pleura: Stable scattered areas of subpleural scarring and mild pleural thickening, especially in the right lung. Stable calcified granuloma at the posterior right lung base. There is no evidence of pulmonary edema, consolidation, pneumothorax or pleural fluid. Upper Abdomen: Visualized upper abdominal structures are unremarkable. Musculoskeletal: No chest wall abnormality. No acute or significant osseous findings. Review of the MIP images confirms the above findings. IMPRESSION: 1. Expected postoperative appearance after Bentall procedure with replacement of the aortic valve, aortic root and ascending thoracic aorta. The aortic graft shows normal patency without evidence of anastomotic pseudoaneurysm. Left atrial clip present related to Maze procedure. No evidence of aortic dissection. The rest of the native thoracic aorta shows stable appearance and caliber. 2. 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 3. Stable partially solid and partially cystic nodule in the inferior aspect of the right lobe of the thyroid gland measuring approximately 4 cm. Electronically Signed   By: Aletta Edouard M.D.   On: 12/05/2016 14:04      Recent Lab Findings: Lab Results  Component Value Date   WBC 11.0 (H) 09/25/2015   HGB 10.3 (L) 09/25/2015   HCT 31.5 (L) 09/25/2015   PLT 207 09/25/2015   GLUCOSE 114 (H) 09/25/2015   ALT 307 (H) 09/26/2015   AST 89 (H) 09/26/2015   NA 135 09/25/2015   K 3.9 09/25/2015   CL 97 (L) 09/25/2015   CREATININE 1.10 09/25/2015   BUN 19 09/25/2015   CO2 28  09/25/2015   INR 2.46 (H) 09/26/2015   HGBA1C 5.8 (H) 09/18/2015      Assessment / Plan:     Expected postoperative appearance after Bentall procedure with replacement of the aortic valve, aortic root and ascending thoracic aorta. The aortic graft shows normal patency without evidence of anastomotic pseudoaneurysm. Left atrial clip present related to Maze procedure. No evidence of aortic dissection. The rest of the native thoracic aorta shows stable appearance and caliber. Patient stable over a year following root replacement He notes he scheduled for an echocardiogram in August of this year I plan to see him back as needed or at Dr. Irven Shelling request  With the patient's prosthetic heart valve the risks of endocarditis have been discussed. The recommendations for periprocedural antibiotics including dental procedures have been discussed with the patient.   [name] MD      [address] [place] [address] Office [phone]   Beeper [phone]  12/05/2016 2:26 PM

## 2017-08-10 IMAGING — CR DG CHEST 2V
2 series · 2 of 2 positions shown · non-contrast
Comparison: 09/23/2015.

CLINICAL DATA: 63-year-old male status post aortic valve
replacement.

EXAM:
CHEST  2 VIEW

[chest pa]
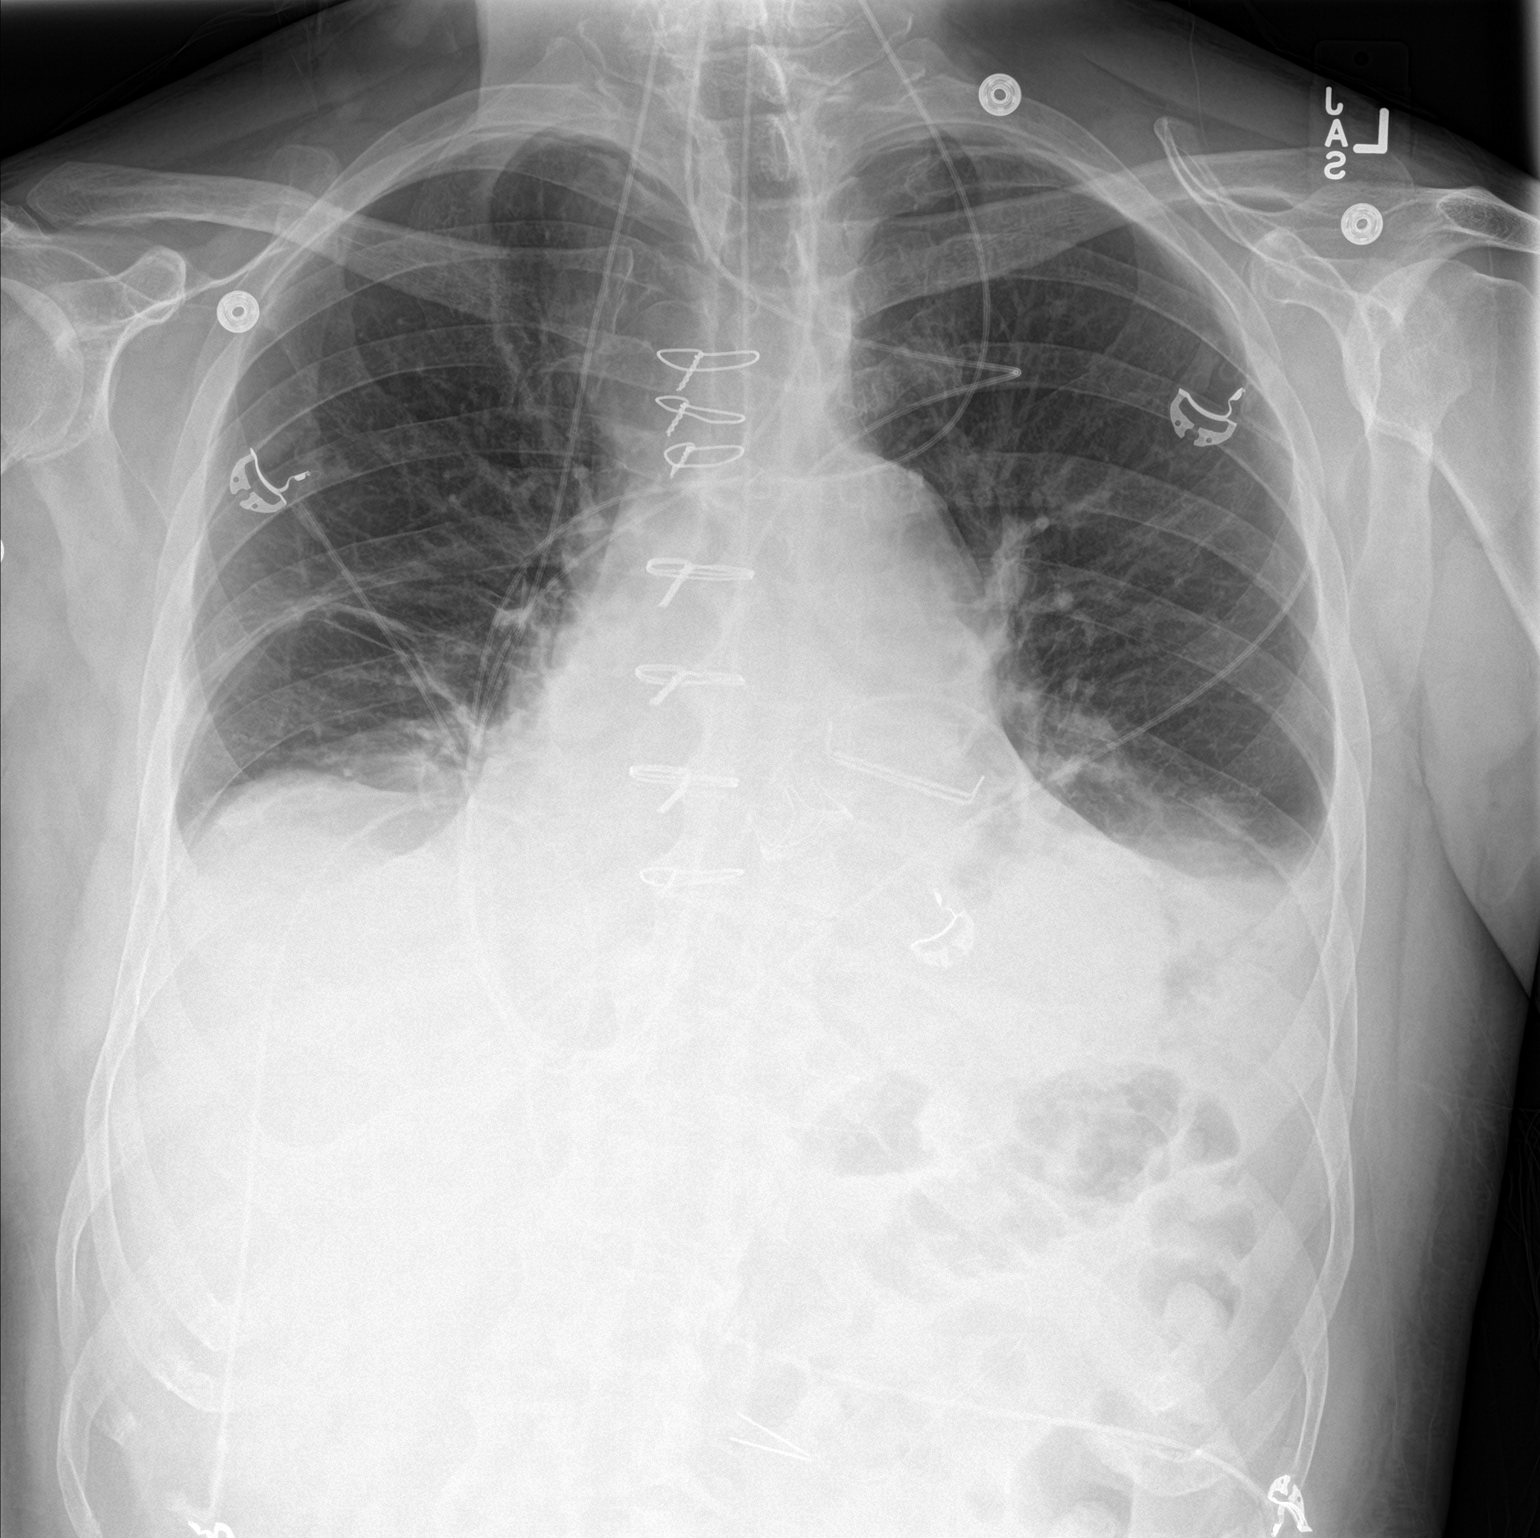

[chest lat]
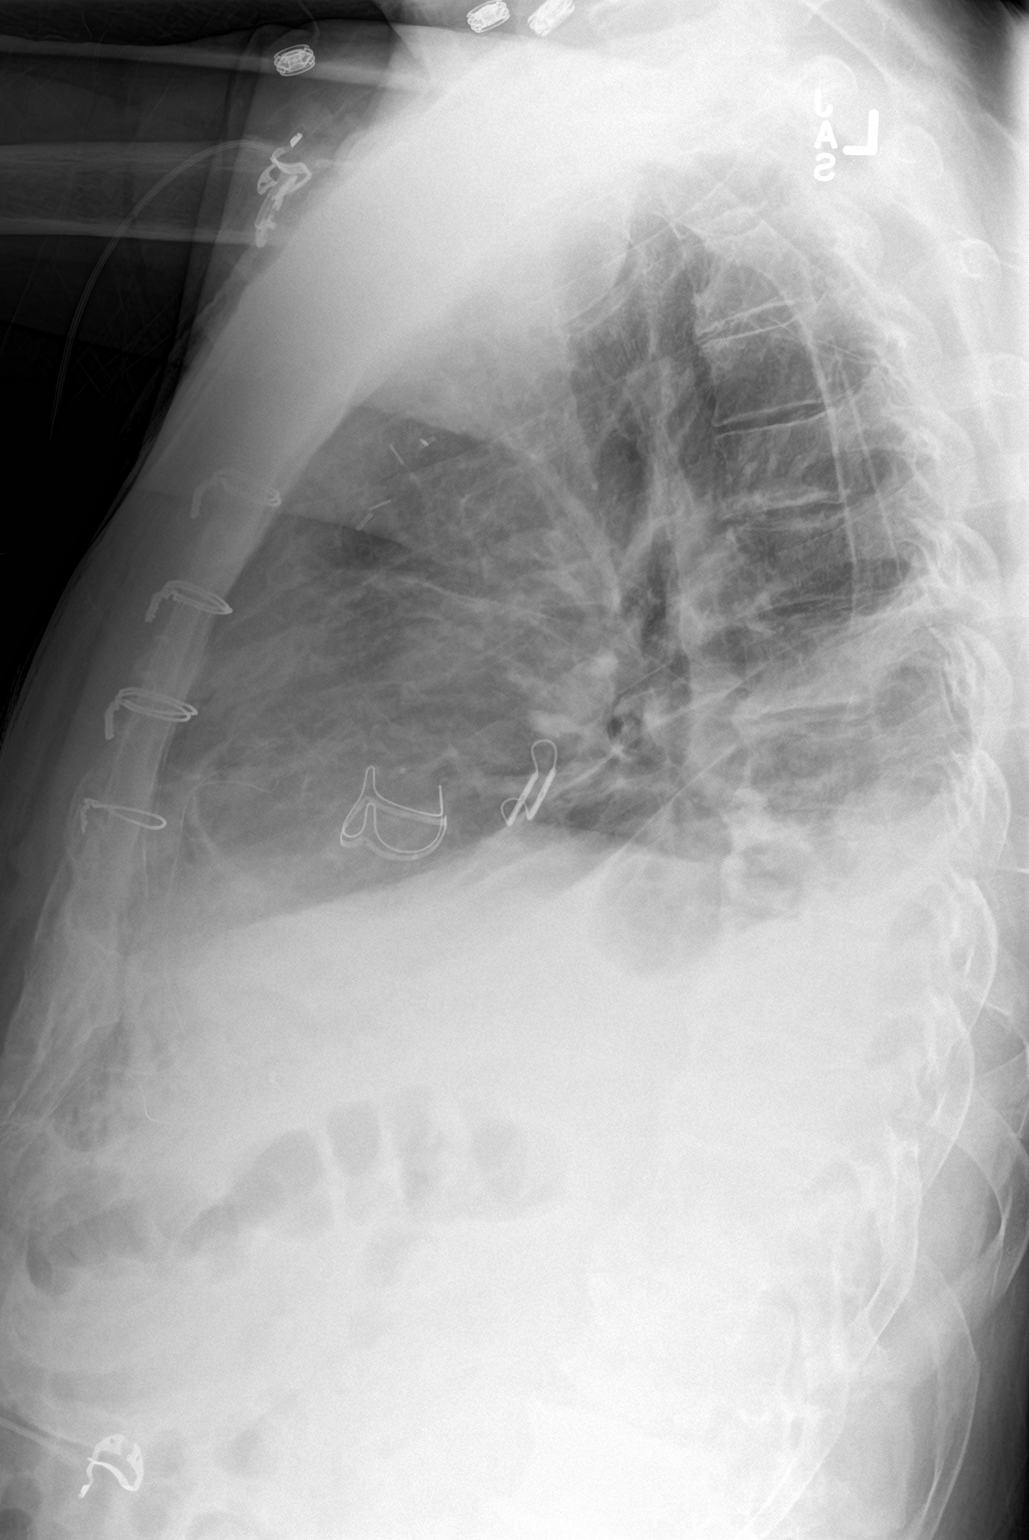

[2 of 2 positions shown; findings below may reference images not displayed]

FINDINGS: Multiple ECG leads project over the central thorax, limiting
assessment for other hardware. No definite tubes or lines are
identified on today's examination. Lung volumes are very low. There
are extensive bibasilar opacities which are presumably related to
small bilateral pleural effusions and areas of postoperative
atelectasis. Crowding of the pulmonary vasculature, accentuated by
low lung volumes, without frank pulmonary edema. Cardiopericardial
silhouette appears borderline enlarged, within normal limits for a
postoperative patient. Upper mediastinal contours are distorted by
low lung volumes and patient's rotation to the right. Status post
median sternotomy for aortic valve replacement (a stented aortic
valve bioprosthesis is noted), and left atrial appendage ligation (a
ligation clip is noted).
IMPRESSION: 1. Postoperative changes and support apparatus, as above.
2. Low lung volumes with small bilateral pleural effusions and
extensive postoperative passive atelectasis in the lung bases
bilaterally.

## 2017-09-11 IMAGING — CR DG CHEST 1V SAME DAY
1 series · 1 of 1 positions shown · non-contrast
Comparison: PA and lateral chest x-ray of earlier today as well as
a PA and lateral chest x-ray September 24, 2015.

CLINICAL DATA: End expiratory study requested to rule out asmall
left apical pneumothorax.

EXAM:
CHEST - 1 VIEW SAME DAY

[w chest pa]
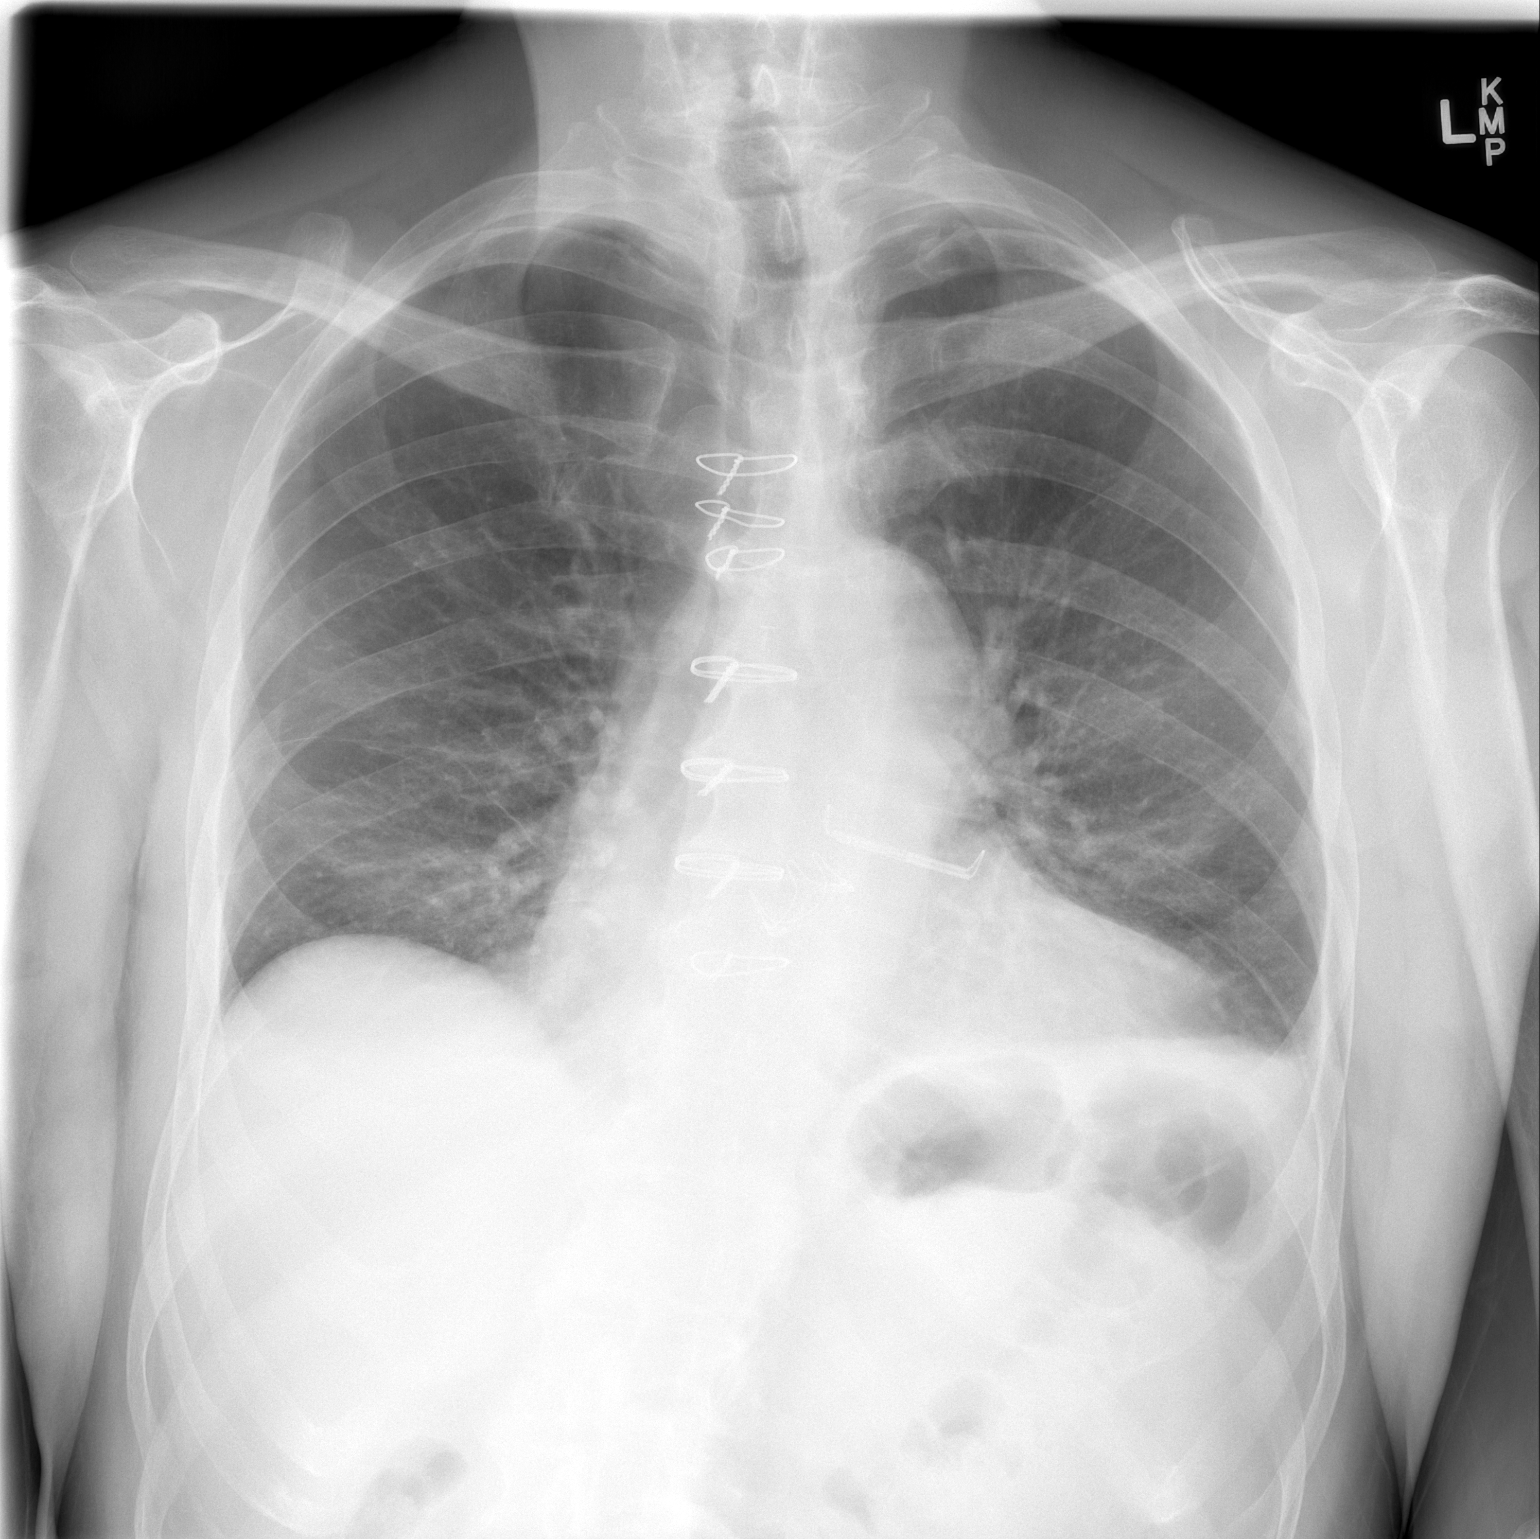

[1 of 1 positions shown; findings below may reference images not displayed]

FINDINGS: The subtle pleural line demonstrated on the previous study is not
evident on the end expiratory chest film. A tiny left pleural
effusion is present. The heart is top-normal in size. The pulmonary
vascularity is not engorged.
IMPRESSION: There is no evidence of a new left-sided pneumothorax. The findings
on the previous study likely was secondary to overlap of normal
structures. No acute cardiopulmonary abnormality is demonstrated.

These results were called by telephone at the time of interpretation
on 10/26/2015 at [DATE] to Ya Ching in Dr. [REDACTED], who
verbally acknowledged these results.

## 2017-09-11 IMAGING — CR DG CHEST 2V
2 series · 2 of 2 positions shown · non-contrast
Comparison: PA and lateral chest x-ray September 24, 2015

CLINICAL DATA: History of aortic valve replacement and Maze
procedure on September 20, 2015; the patient is having deep
inspiratory chest tightness over the past 3-4 days.

EXAM:
CHEST  2 VIEW

[view not recorded (1 of 2)]
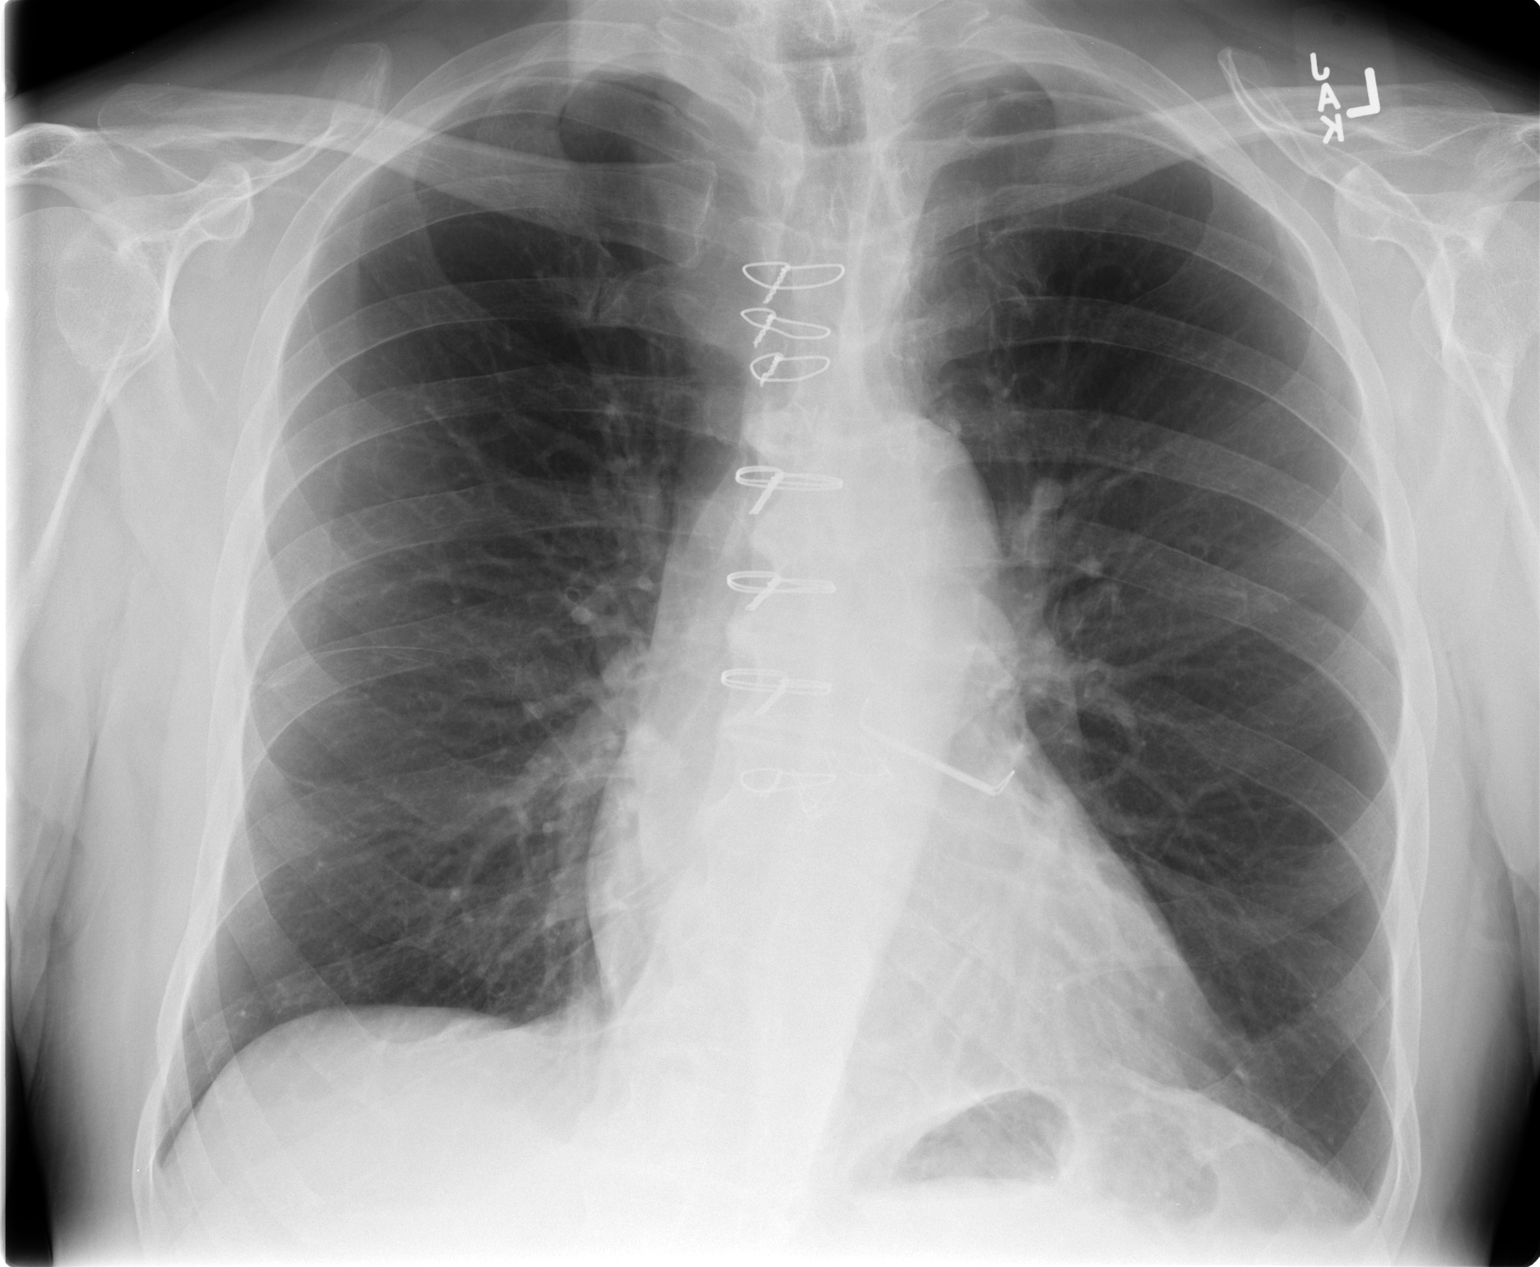

[view not recorded (2 of 2)]
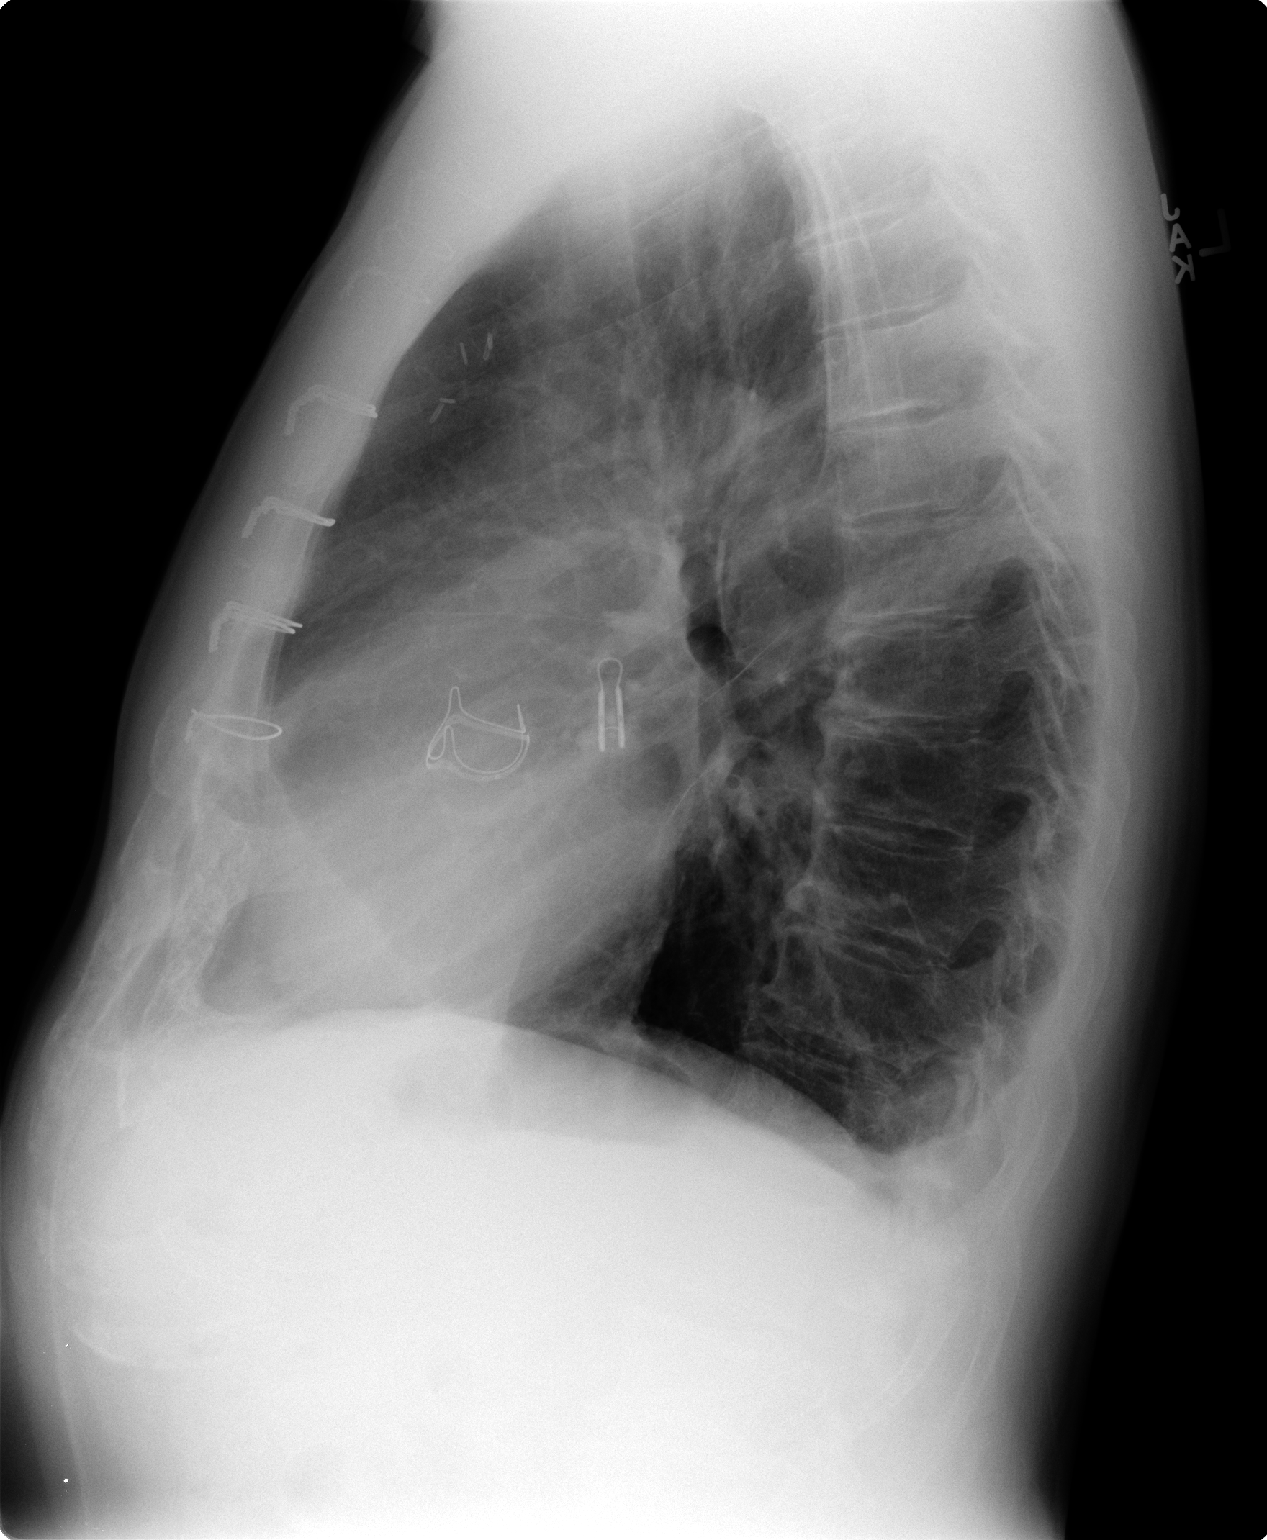

[2 of 2 positions shown; findings below may reference images not displayed]

FINDINGS: The lungs are well-expanded. A less than 5% left apical pneumothorax
cannot be excluded. The pleural effusions have nearly totally
resolved with only a trace of fluid remaining on the left. The heart
and pulmonary vascularity are normal. The left atrial appendage clip
is unchanged in position. The prosthetic aortic valve ring appears
stable. The retrosternal soft tissues are normal. There are 7 intact
sternal wires.
IMPRESSION: 1. Possible less than 5% left apical pneumothorax. This merits
further evaluation with an in and expiratory PA chest x-ray.
2. Interval near total resolution of the bilateral pleural
effusions. There is no evidence of CHF nor pneumonia.
3. These results were called by telephone at the time of
interpretation on 10/26/2015 at [DATE] to the receptionist, who
verbally acknowledged these results. She is implementing the order
for an end expiratory chest x-ray.

## 2017-11-25 IMAGING — US US THYROID BIOPSY
1 series · 14 of 15 positions shown · non-contrast
Comparison: Prior thyroid ultrasound 09/18/2015

INDICATION: 64-year-old male with right-sided thyroid nodule

EXAM:
ULTRASOUND GUIDED NEEDLE ASPIRATE BIOPSY OF THE THYROID GLAND

[Series 1: us thyroid biopsy · 0.10mm/px · 15 acquisitions, 14 frames shown]
[im 1/15]
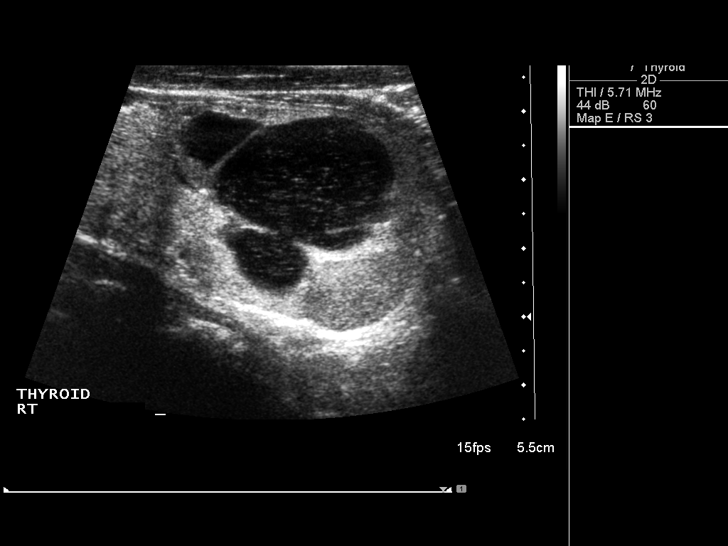
[im 2/15]
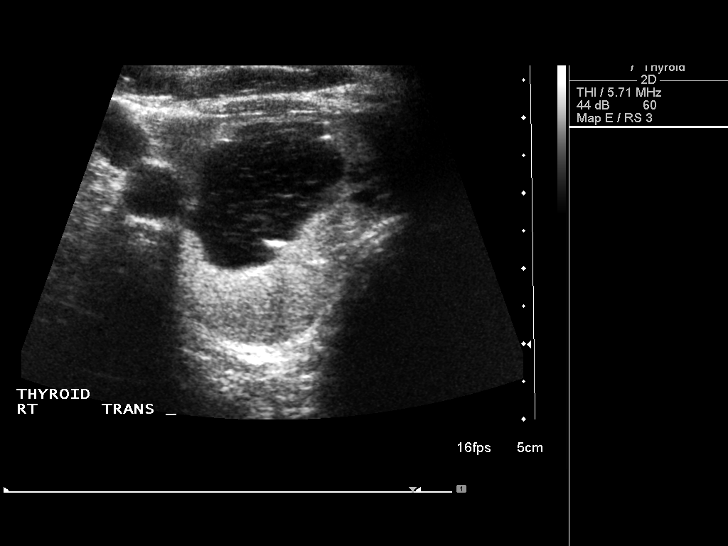
[im 3/15]
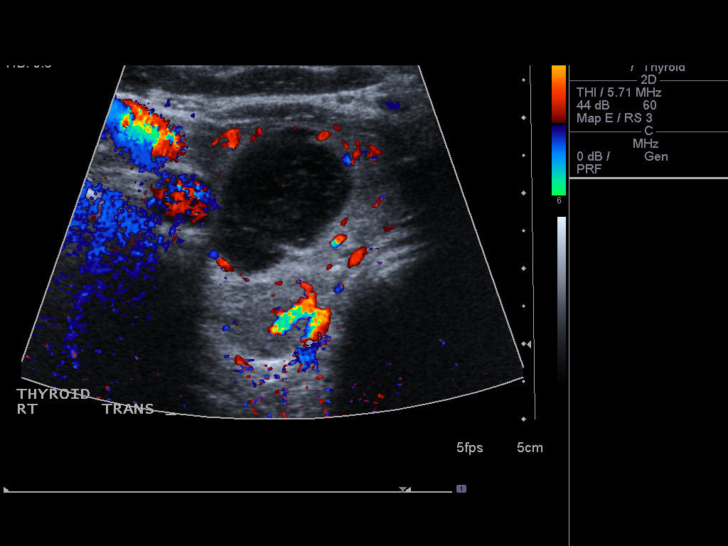
[im 4/15]
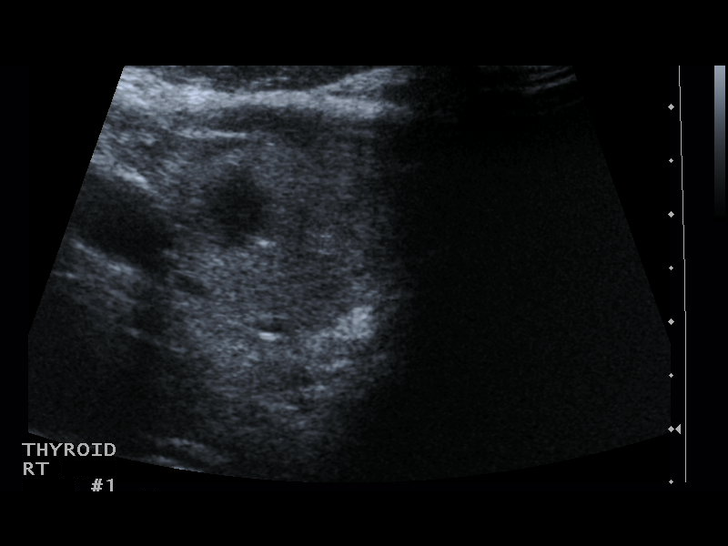
[im 5/15]
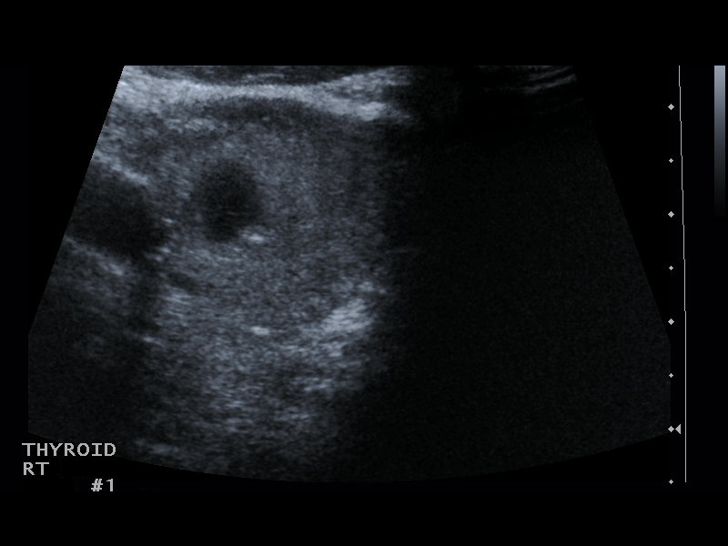
[im 6/15]
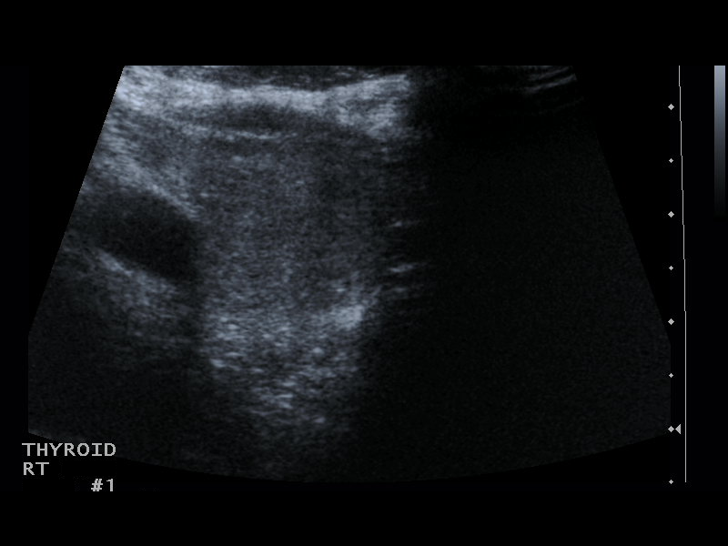
[im 7/15]
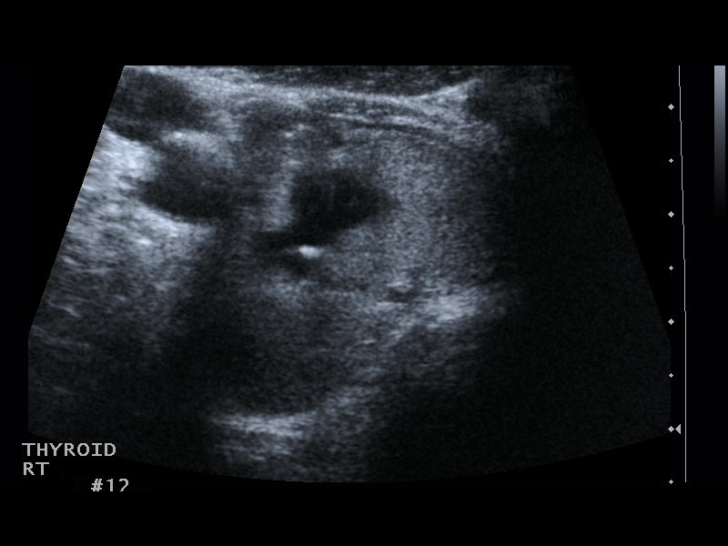
[im 9/15]
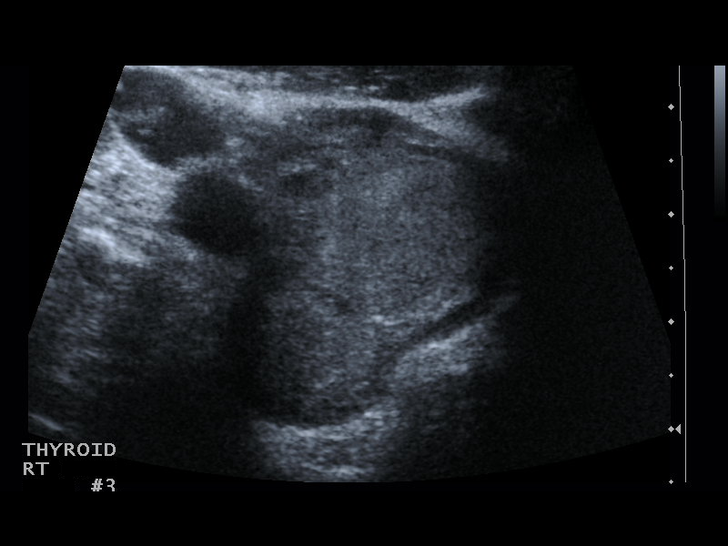
[im 10/15]
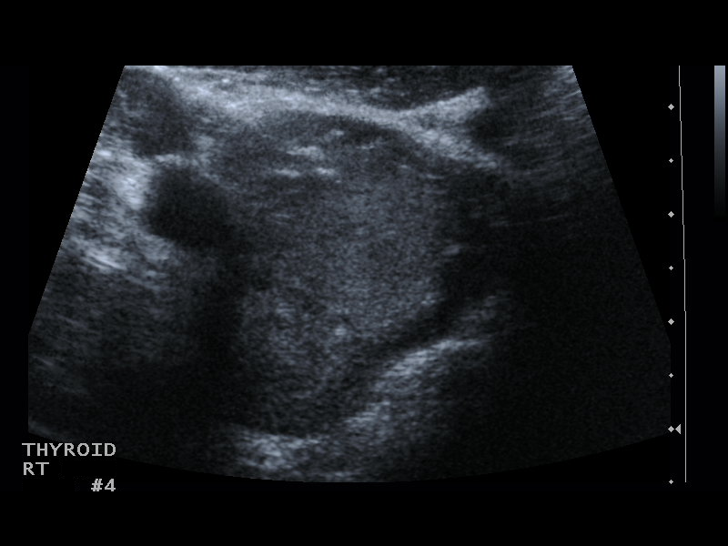
[im 11/15]
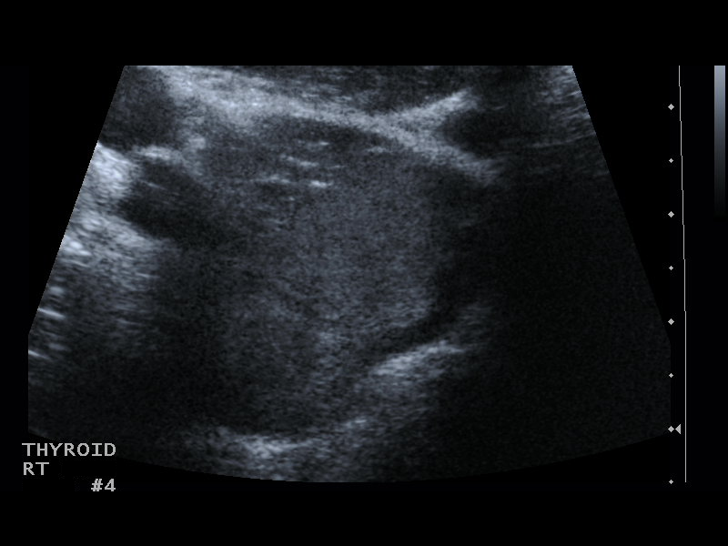
[im 12/15]
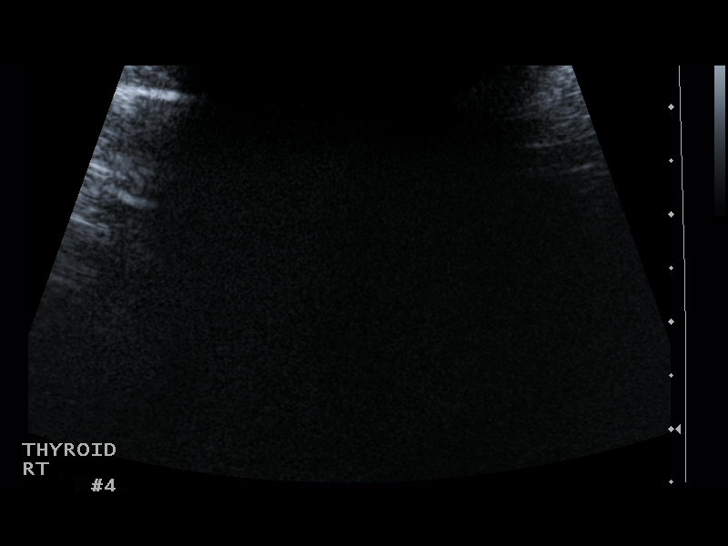
[im 13/15]
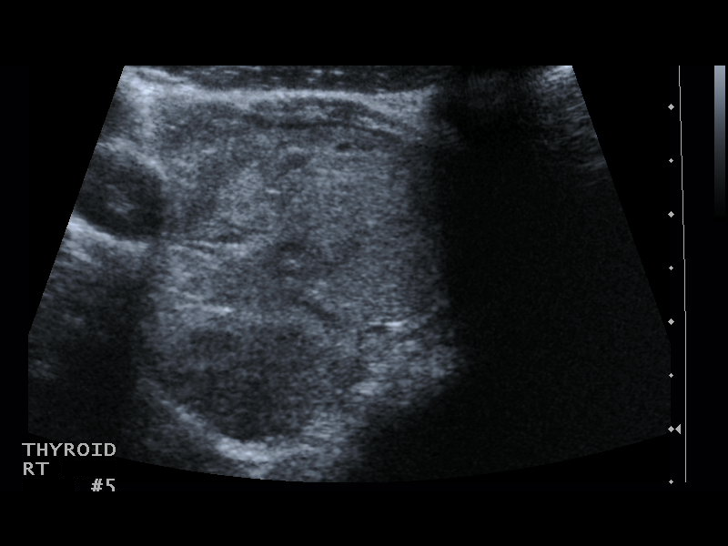
[im 14/15]
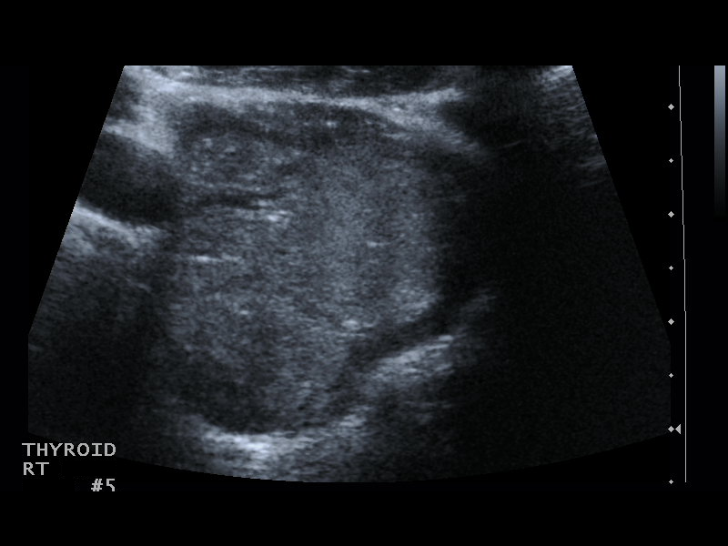
[im 15/15]
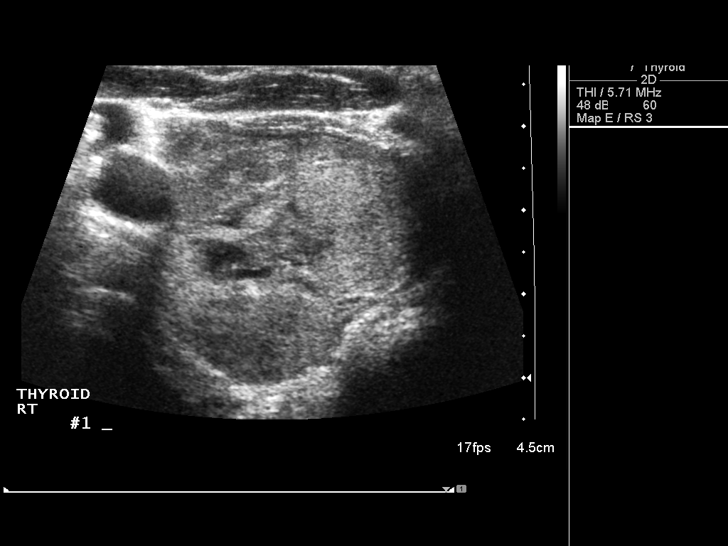

[14 of 15 positions shown; findings below may reference images not displayed]

MEDICATIONS:
1% lidocaine

COMPLICATIONS:
None immediate.

PROCEDURE:
Informed written consent was obtained from the patient after a
thorough discussion of the procedural risks, benefits and
alternatives. All questions were addressed. Maximal Sterile Barrier
Technique was utilized including caps, mask, sterile gowns, sterile
gloves, sterile drape, hand hygiene and skin antiseptic. A timeout
was performed prior to the initiation of the procedure.

Ultrasound was performed to localize and mark an adequate site for
the biopsy. The patient was then prepped and draped in a normal
sterile fashion. Local anesthesia was provided with 1% lidocaine.
Using direct ultrasound guidance, 4 passes were made using needles
into the nodule within the right lobe of the thyroid. Ultrasound was
used to confirm needle placements on all occasions. Specimens were
sent to Pathology for analysis.
IMPRESSION: Ultrasound guided needle aspirate biopsy performed of the right
thyroid nodule.

## 2018-12-28 ENCOUNTER — Other Ambulatory Visit: Payer: Self-pay | Admitting: Cardiology

## 2019-04-06 ENCOUNTER — Other Ambulatory Visit: Payer: Self-pay

## 2019-04-06 MED ORDER — ROSUVASTATIN CALCIUM 10 MG PO TABS: 10.0000 mg | ORAL_TABLET | Freq: Every day | ORAL | 1 refills | Status: DC

## 2019-04-06 MED ORDER — METOPROLOL SUCCINATE ER 50 MG PO TB24
50.0000 mg | ORAL_TABLET | Freq: Every day | ORAL | 1 refills | Status: DC
Start: 2019-04-06 — End: 2019-09-02

## 2019-08-23 ENCOUNTER — Ambulatory Visit (INDEPENDENT_AMBULATORY_CARE_PROVIDER_SITE_OTHER): Payer: Medicare Other | Admitting: Cardiology

## 2019-08-23 ENCOUNTER — Encounter: Payer: Self-pay | Admitting: Cardiology

## 2019-08-23 ENCOUNTER — Other Ambulatory Visit: Payer: Self-pay

## 2019-08-23 VITALS — BP 132/80 | HR 63 | Temp 98.3°F | Ht 74.0 in | Wt 190.0 lb

## 2019-08-23 DIAGNOSIS — R0989 Other specified symptoms and signs involving the circulatory and respiratory systems: Secondary | ICD-10-CM

## 2019-08-23 DIAGNOSIS — E785 Hyperlipidemia, unspecified: Secondary | ICD-10-CM

## 2019-08-23 DIAGNOSIS — Z952 Presence of prosthetic heart valve: Secondary | ICD-10-CM

## 2019-08-23 DIAGNOSIS — Z95828 Presence of other vascular implants and grafts: Secondary | ICD-10-CM

## 2019-08-23 NOTE — Progress Notes (Addendum)
Primary Physician/Referring:  [name], MD  Patient ID: Chad Lawrence, male    DOB: 01/10/1951, 68 y.o.   MRN: [id number]  Chief Complaint  Patient presents with  . Hyperlipidemia    1 year follow up  . Mitral Valve Repair  . Follow-up   HPI:    Chad Lawrence  is a 68 y.o. Caucasian male with history of thoracic aneurysm noted on echocardiogram dated 09/06/2015, episode of atrial fibrillation, underwent Bentall procedure with replacement of aortic valve, LAA ligation and modified maze procedure and reimplantation of his coronary arteries on 09/20/2015 by Dr. Servando Snare. He did have persistant A. Fib post operatively and converted to sinus 2-3 weeks later and has maintained sinus since. He has no h/o hypertension, DM. He has Mild hyperlipidemia presently on statin.  Essentially asymptomatic and presents for one year f/u. Continues to be physically active walking 12K steps a day.   Past Medical History:  Diagnosis Date  . Aortic regurgitation   . Ascending aorta dilatation (HCC)   . Atrial fibrillation ([place])   . Hyperlipidemia   . Pneumonia    Past Surgical History:  Procedure Laterality Date  . AORTIC VALVE REPLACEMENT    . BENTALL PROCEDURE N/A 09/20/2015   Procedure: BENTALL PROCEDURE;  Surgeon: [name], MD;  Location: [facility];  Service: Open Heart Surgery;  Laterality: N/A;  . CARDIAC CATHETERIZATION N/A 09/12/2015   Procedure: Left Heart Cath and Coronary Angiography;  Surgeon: Adrian Prows, MD;  Location: [facility];  Service: Cardiovascular;  Laterality: N/A;  . CLIPPING OF ATRIAL APPENDAGE N/A 09/20/2015   Procedure: CLIPPING OF ATRIAL APPENDAGE;  Surgeon: [name], MD;  Location: [place] [facility];  Service: Open Heart Surgery;  Laterality: N/A;  . COLONOSCOPY     x 2  . HERNIA REPAIR    . MAZE N/A 09/20/2015   Procedure: MAZE;  Surgeon: [name], MD;  Location: [facility];  Service: Open Heart Surgery;  Laterality: N/A;  . TEE WITHOUT  CARDIOVERSION N/A 09/20/2015   Procedure: TRANSESOPHAGEAL ECHOCARDIOGRAM (TEE);  Surgeon: [name], MD;  Location: [place];  Service: Open Heart Surgery;  Laterality: N/A;   Social History   Socioeconomic History  . Marital status: Married    Spouse name: Not on file  . Number of children: 5  . Years of education: Not on file  . Highest education level: Not on file  Occupational History  . Not on file  Social Needs  . Financial resource strain: Not on file  . Food insecurity    Worry: Not on file    Inability: Not on file  . Transportation needs    Medical: Not on file    Non-medical: Not on file  Tobacco Use  . Smoking status: Never Smoker  . Smokeless tobacco: Never Used  Substance and Sexual Activity  . Alcohol use: Yes    Alcohol/week: 0.0 standard drinks    Comment: occasional  . Drug use: No  . Sexual activity: Not on file  Lifestyle  . Physical activity    Days per week: Not on file    Minutes per session: Not on file  . Stress: Not on file  Relationships  . Social Herbalist on phone: Not on file    Gets together: Not on file    Attends religious service: Not on file    Active member of club or organization: Not on file    Attends  meetings of clubs or organizations: Not on file    Relationship status: Not on file  . Intimate partner violence    Fear of current or ex partner: Not on file    Emotionally abused: Not on file    Physically abused: Not on file    Forced sexual activity: Not on file  Other Topics Concern  . Not on file  Social History Narrative  . Not on file   ROS  Review of Systems  Constitution: Negative for chills, decreased appetite, malaise/fatigue and weight gain.  Cardiovascular: Negative for dyspnea on exertion, leg swelling and syncope.  Endocrine: Negative for cold intolerance.  Hematologic/Lymphatic: Does not bruise/bleed easily.  Musculoskeletal: Negative for joint swelling.  Gastrointestinal: Negative for  abdominal pain, anorexia, change in bowel habit, hematochezia and melena.  Neurological: Negative for headaches and light-headedness.  Psychiatric/Behavioral: Negative for depression and substance abuse.  All other systems reviewed and are negative.  Objective   Vitals with BMI 08/23/2019 12/05/2016 11/28/2015  Height _0  _1  _2   Weight 190 lbs 185 lbs 184 lbs  BMI 62.26 33.3 25  Systolic 545 625 638  Diastolic 80 73 74  Pulse 63 62 68    Blood pressure 132/80, pulse 63, temperature 98.3 F (36.8 C), height _3  (1.88 m), weight 190 lb (86.2 kg), SpO2 98 %. Body mass index is 24.39 kg/m.   Physical Exam  Constitutional: He appears well-developed and well-nourished. No distress.  HENT:  Head: Atraumatic.  Eyes: Conjunctivae are normal.  Neck: Neck supple. No JVD present. No thyromegaly present.  Cardiovascular: Normal rate, regular rhythm, S1 normal, S2 normal, intact distal pulses and normal pulses. Exam reveals no gallop.  Murmur heard.  Scratchy early systolic murmur is present with a grade of 2/6 at the upper right sternal border radiating to the neck. Pulmonary/Chest: Effort normal and breath sounds normal.  Sternotomy scar noted  Abdominal: Soft. Bowel sounds are normal.  Musculoskeletal: Normal range of motion.        General: No edema.  Neurological: He is alert.  Skin: Skin is warm and dry.  Psychiatric: He has a normal mood and affect.   Radiology: No results found.  Laboratory examination:   04/07/2019: HB 15.2/HCT 44.7, platelets 200.  Potassium 4.8, BUN 14, creatinine 1.0, eGFR greater than 60 minutes, total cholesterol 118, triglycerides 80, HDL 51, LDL 67.  PSA normal.  Labs 10/13/2018: HB 15.4/HCT 46.0, platelets 263.  BUN 12, creatinine 1.05, eGFR greater than 60 him a, CMP otherwise normal.  Potassium 4.7.  Total cholesterol 117, triglycerides 102, HDL 45, LDL 52.  No results for input(s): NA, K, CL, CO2, GLUCOSE, BUN, CREATININE, CALCIUM, GFRNONAA,  GFRAA in the last 8760 hours. CMP Latest Ref Rng & Units 09/26/2015 09/25/2015 09/24/2015  Glucose 65 - 99 mg/dL - 114(H) 108(H)  BUN 6 - 20 mg/dL - 19 23(H)  Creatinine 0.61 - 1.24 mg/dL - 1.10 1.10  Sodium 135 - 145 mmol/L - 135 133(L)  Potassium 3.5 - 5.1 mmol/L - 3.9 4.0  Chloride 101 - 111 mmol/L - 97(L) 98(L)  CO2 22 - 32 mmol/L - 28 26  Calcium 8.9 - 10.3 mg/dL - 8.5(L) 8.4(L)  Total Protein 6.5 - 8.1 g/dL 5.7(L) - 5.6(L)  Total Bilirubin 0.3 - 1.2 mg/dL 1.3(H) - 0.9  Alkaline Phos 38 - 126 U/L 84 - 65  AST 15 - 41 U/L 89(H) - 215(H)  ALT 17 - 63 U/L 307(H) - 388(H)  CBC Latest Ref Rng & Units 09/25/2015 09/24/2015 09/23/2015  WBC 4.0 - 10.5 K/uL 11.0(H) 13.1(H) 14.9(H)  Hemoglobin 13.0 - 17.0 g/dL 10.3(L) 10.3(L) 10.7(L)  Hematocrit 39.0 - 52.0 % 31.5(L) 31.1(L) 32.0(L)  Platelets 150 - 400 K/uL 207 169 125(L)   Lipid Panel  No results found for: CHOL, TRIG, HDL, CHOLHDL, VLDL, LDLCALC, LDLDIRECT HEMOGLOBIN A1C Lab Results  Component Value Date   HGBA1C 5.8 (H) 09/18/2015   MPG 120 09/18/2015   TSH No results for input(s): TSH in the last 8760 hours. Medications and allergies  No Known Allergies   Prior to Admission medications   Medication Sig Start Date End Date Taking? Authorizing Provider  aspirin EC 81 MG tablet Take 81 mg by mouth every morning.     [provider]  metoprolol succinate (TOPROL-XL) 50 MG 24 hr tablet Take 1 tablet (50 mg total) by mouth daily. Take with or immediately following a meal. 04/06/19   Adrian Prows, MD  rosuvastatin (CRESTOR) 10 MG tablet Take 1 tablet (10 mg total) by mouth daily. 04/06/19   Adrian Prows, MD     Current Outpatient Medications  Medication Instructions  . aspirin EC 81 mg, Oral,  Every morning - 10a  . metoprolol succinate (TOPROL-XL) 50 mg, Oral, Daily, Take with or immediately following a meal.   . rosuvastatin (CRESTOR) 10 mg, Oral, Daily    Cardiac Studies:   Bentall Procedure 09/20/15: ascending aortic  aneurysm repair with 30 mm Gelweave Valsalva graft and [employer] pericardial tissue valve 25 mm model 3300 TFX  CT angiogram chest 12/05/2016: Expected postoperative appearance after Bentall procedure with replacement of aortic valve, aortic root and descending thoracic aorta. 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  Echocardiogram 09/15/2018: Left ventricle cavity is normal in size. Mild asymmetric hypertrophy of the left ventricle. Normal global wall motion. Normal diastolic filling pattern. Calculated EF 67%. Left atrial cavity is mildly dilated. Well seated bioprosthetic aortic valve. Mean PG 8 mmHg. No significant stenosis or regurgitation noted. Inadequate tricuspid regurgitation jet to estimate pulmonary artery pressure. Normal right atrial pressure.  No significant change compare to previous study on 07/24/2017.  Assessment     ICD-10-CM   1. S/P AVR  Z95.2 EKG 12-Lead    CT ANGIO CHEST AORTA W/CM &/OR WO/CM   09/20/15: Ascending aortic aneurysm repair Bentall procedure 30 mm Gelweave Valsalva graft and [employer] pericardial tissue valve 25 mm model 3300 TFX  2. S/P ascending aortic replacement  Z95.828 CT ANGIO CHEST AORTA W/CM &/OR WO/CM   09/20/15: Ascending aortic aneurysm repair Bentall procedure 30 mm Gelweave Valsalva graft and [employer] pericardial tissue valve 25 mm model 3300 TFX  3. Mild hyperlipidemia  E78.5 EKG 12-Lead  4. Left carotid bruit  R09.89 PCV CAROTID DUPLEX (BILATERAL)    EKG 08/23/2019: Normal sinus rhythm at rate of 59 bpm, normal axis.  No evidence of ischemia, normal EKG.  Borderline low voltage complexes. No significant change from  EKG 08/19/2018.  Recommendations:   Patient is here on annual visit and follow-up of ascending aortic aneurysm repair, aortic regurgitation, he is presently doing well, I do hear a new left  carotid bruit, probably conducted sounds from the aortic valve.  I'll obtain a carotid artery duplex.  It is been almost 3 years since CT angiogram of the chest, to follow-up on the aneurysm repair I'll obtain this.  I  reviewed his labs and PCP, lipids are under excellent control.  Otherwise no changes physical examination EKG remains unchanged as well.  I'll see him back on an annual basis.  Adrian Prows, MD, [facility] 08/23/2019, 9:05 AM [facility]. [place] Pager: [phone] Office: [phone] If no answer Cell [phone]

## 2019-09-02 ENCOUNTER — Other Ambulatory Visit: Payer: Self-pay | Admitting: Cardiology

## 2019-09-06 ENCOUNTER — Other Ambulatory Visit: Payer: Self-pay

## 2019-09-06 ENCOUNTER — Ambulatory Visit (INDEPENDENT_AMBULATORY_CARE_PROVIDER_SITE_OTHER): Payer: Medicare Other

## 2019-09-06 DIAGNOSIS — R0989 Other specified symptoms and signs involving the circulatory and respiratory systems: Secondary | ICD-10-CM | POA: Diagnosis not present

## 2019-09-20 ENCOUNTER — Ambulatory Visit
Admission: RE | Admit: 2019-09-20 | Discharge: 2019-09-20 | Disposition: A | Discharge status: Home / Self Care | Payer: Medicare Other | Source: Ambulatory Visit | Attending: Cardiology | Admitting: Cardiology

## 2019-09-20 DIAGNOSIS — Z952 Presence of prosthetic heart valve: Secondary | ICD-10-CM

## 2019-09-20 DIAGNOSIS — Z95828 Presence of other vascular implants and grafts: Secondary | ICD-10-CM

## 2019-09-20 MED ORDER — IOPAMIDOL (ISOVUE-370) INJECTION 76%
75.0000 mL | Freq: Once | INTRAVENOUS | Status: AC | PRN
  Administered 2019-09-20: 11:00:00 75 mL via INTRAVENOUS

## 2019-12-26 ENCOUNTER — Ambulatory Visit: Payer: Medicare Other | Attending: Internal Medicine

## 2019-12-26 DIAGNOSIS — Z23 Encounter for immunization: Secondary | ICD-10-CM

## 2019-12-26 NOTE — Progress Notes (Signed)
   Covid-19 Vaccination Clinic  Name:  Chad Lawrence    MRN: [id number] DOB: [birth year]  12/26/2019  Mr. Chad Lawrence was observed post Covid-19 immunization for 15 minutes without incidence. He was provided with Vaccine Information Sheet and instruction to access the V-Safe system.   Mr. [name] was instructed to call 911 with any severe reactions post vaccine: Marland Kitchen Difficulty breathing  . Swelling of your face and throat  . A fast heartbeat  . A bad rash all over your body  . Dizziness and weakness    Immunizations Administered    Name Date Dose VIS Date Route   Pfizer COVID-19 Vaccine 12/26/2019  3:41 PM 0.3 mL 10/29/2019 Intramuscular   Manufacturer: [place]   Lot: [id number]   [place]: [id number]

## 2020-01-11 ENCOUNTER — Ambulatory Visit: Payer: TRICARE For Life (TFL)

## 2020-01-19 ENCOUNTER — Ambulatory Visit: Payer: Medicare Other | Attending: Internal Medicine

## 2020-01-19 ENCOUNTER — Ambulatory Visit: Payer: TRICARE For Life (TFL)

## 2020-01-19 DIAGNOSIS — Z23 Encounter for immunization: Secondary | ICD-10-CM

## 2020-01-19 NOTE — Progress Notes (Signed)
   Covid-19 Vaccination Clinic  Name:  Chad Lawrence    MRN: [id number] DOB: [birth year]  01/19/2020  Mr. [name] was observed post Covid-19 immunization for 15 minutes without incident. He was provided with Vaccine Information Sheet and instruction to access the V-Safe system.   Mr. [name] was instructed to call 911 with any severe reactions post vaccine: Marland Kitchen Difficulty breathing  . Swelling of face and throat  . A fast heartbeat  . A bad rash all over body  . Dizziness and weakness   Immunizations Administered    Name Date Dose VIS Date Route   Pfizer COVID-19 Vaccine 01/19/2020  3:33 PM 0.3 mL 10/29/2019 Intramuscular   Manufacturer: [place]   Lot: [id number]   [place]: [id number]

## 2020-08-21 ENCOUNTER — Ambulatory Visit: Payer: Medicare Other | Admitting: Student

## 2020-08-21 ENCOUNTER — Other Ambulatory Visit: Payer: Self-pay

## 2020-08-21 ENCOUNTER — Encounter: Payer: Self-pay | Admitting: Student

## 2020-08-21 ENCOUNTER — Ambulatory Visit: Payer: Medicare Other | Admitting: Cardiology

## 2020-08-21 VITALS — BP 120/76 | HR 73 | Ht 72.0 in | Wt 191.0 lb

## 2020-08-21 DIAGNOSIS — E785 Hyperlipidemia, unspecified: Secondary | ICD-10-CM

## 2020-08-21 DIAGNOSIS — R0989 Other specified symptoms and signs involving the circulatory and respiratory systems: Secondary | ICD-10-CM

## 2020-08-21 DIAGNOSIS — Z95828 Presence of other vascular implants and grafts: Secondary | ICD-10-CM

## 2020-08-21 DIAGNOSIS — Z952 Presence of prosthetic heart valve: Secondary | ICD-10-CM

## 2020-08-21 NOTE — Progress Notes (Signed)
Primary Physician/Referring:  [name], MD  Patient ID: Chad Lawrence, male    DOB: 07/24/1951, 69 y.o.   MRN: [id number]  Chief Complaint  Patient presents with  . AAA  . Follow-up   HPI:    Chad Lawrence  is a 69 y.o. 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 Servando Snare. He did have persistant A. Fib post operatively and converted to sinus 2-3 weeks later and has maintained sinus since. He has no h/o hypertension, DM. He has Mild hyperlipidemia presently on statin.  Patient presents for annual follow-up.  He is presently asymptomatic and continues to be active, walking 10-12,000 steps 7 days a week without issue.  Denies chest pain, shortness of breath, dizziness, syncope.   Past Medical History:  Diagnosis Date  . Aortic regurgitation   . Ascending aorta dilatation (HCC)   . Atrial fibrillation ([place])   . Hyperlipidemia   . Pneumonia    Past Surgical History:  Procedure Laterality Date  . AORTIC VALVE REPLACEMENT    . BENTALL PROCEDURE N/A 09/20/2015   Procedure: BENTALL PROCEDURE;  Surgeon: [name], MD;  Location: [place];  Service: Open Heart Surgery;  Laterality: N/A;  . CARDIAC CATHETERIZATION N/A 09/12/2015   Procedure: Left Heart Cath and Coronary Angiography;  Surgeon: Adrian Prows, MD;  Location: [facility];  Service: Cardiovascular;  Laterality: N/A;  . CLIPPING OF ATRIAL APPENDAGE N/A 09/20/2015   Procedure: CLIPPING OF ATRIAL APPENDAGE;  Surgeon: [name], MD;  Location: [facility];  Service: Open Heart Surgery;  Laterality: N/A;  . COLONOSCOPY     x 2  . HERNIA REPAIR    . MAZE N/A 09/20/2015   Procedure: MAZE;  Surgeon: [name], MD;  Location: [place];  Service:  Open Heart Surgery;  Laterality: N/A;  . TEE WITHOUT CARDIOVERSION N/A 09/20/2015   Procedure: TRANSESOPHAGEAL ECHOCARDIOGRAM (TEE);  Surgeon: [name], MD;  Location: [facility];  Service: Open Heart Surgery;  Laterality: N/A;   Social History   Tobacco Use  . Smoking status: Never Smoker  . Smokeless tobacco: Never Used  Substance Use Topics  . Alcohol use: Yes    Alcohol/week: 0.0 standard drinks    Comment: occasional   Marital Status: Married  ROS  Review of Systems  Constitutional: Negative for malaise/fatigue.  Cardiovascular: Negative for chest pain, claudication, dyspnea on exertion, leg swelling, near-syncope, palpitations and syncope.  Respiratory: Negative for shortness of breath.   Hematologic/Lymphatic: Does not bruise/bleed easily.  Gastrointestinal: Negative for change in bowel habit, hematochezia and melena.  Neurological: Negative for headaches and light-headedness.  All other systems reviewed and are negative.  Objective   Vitals with BMI 08/21/2020 08/23/2019 12/05/2016  Height _0  _1  _2   Weight 191 lbs 190 lbs 185 lbs  BMI 25.9 03.50 09.3  Systolic 818 299 371  Diastolic 76 80 73  Pulse 73 63 62    Blood pressure 120/76, pulse 73, height 6' (1.829 m), weight 191 lb (86.6 kg), SpO2 98 %. Body mass index is 25.9 kg/m.  Physical Exam Constitutional:      General: He is not in acute distress.    Appearance: He is well-developed.  HENT:     Head: Atraumatic.  Neck:  Thyroid: No thyromegaly.     Vascular: No JVD.  Cardiovascular:     Rate and Rhythm: Normal rate and regular rhythm.     Pulses: Intact distal pulses.          Carotid pulses are 2+ on the right side and 2+ on the left side with bruit.      Radial pulses are 2+ on the right side and 2+ on the left side.       Popliteal pulses are 2+ on the right side and 2+ on the left side.       Dorsalis pedis pulses are 1+ on the right side and 1+ on the left side.       Posterior tibial  pulses are 2+ on the right side and 2+ on the left side.     Heart sounds: S1 normal and S2 normal. Murmur heard.  Scratchy early systolic murmur is present with a grade of 2/6 at the upper right sternal border radiating to the neck.  No gallop.      Comments: No JVD. No leg edema.  Pulmonary:     Effort: Pulmonary effort is normal.     Breath sounds: Normal breath sounds. No wheezing, rhonchi or rales.  Abdominal:     General: Bowel sounds are normal.     Palpations: Abdomen is soft.  Skin:    General: Skin is warm and dry.  Neurological:     Mental Status: He is alert.    Laboratory examination:   CMP Latest Ref Rng & Units 09/26/2015 09/25/2015 09/24/2015  Glucose 65 - 99 mg/dL - 114(H) 108(H)  BUN 6 - 20 mg/dL - 19 23(H)  Creatinine 0.61 - 1.24 mg/dL - 1.10 1.10  Sodium 135 - 145 mmol/L - 135 133(L)  Potassium 3.5 - 5.1 mmol/L - 3.9 4.0  Chloride 101 - 111 mmol/L - 97(L) 98(L)  CO2 22 - 32 mmol/L - 28 26  Calcium 8.9 - 10.3 mg/dL - 8.5(L) 8.4(L)  Total Protein 6.5 - 8.1 g/dL 5.7(L) - 5.6(L)  Total Bilirubin 0.3 - 1.2 mg/dL 1.3(H) - 0.9  Alkaline Phos 38 - 126 U/L 84 - 65  AST 15 - 41 U/L 89(H) - 215(H)  ALT 17 - 63 U/L 307(H) - 388(H)   CBC Latest Ref Rng & Units 09/25/2015 09/24/2015 09/23/2015  WBC 4.0 - 10.5 K/uL 11.0(H) 13.1(H) 14.9(H)  Hemoglobin 13.0 - 17.0 g/dL 10.3(L) 10.3(L) 10.7(L)  Hematocrit 39 - 52 % 31.5(L) 31.1(L) 32.0(L)  Platelets 150 - 400 K/uL 207 169 125(L)   Lipid Panel  No results found for: CHOL, TRIG, HDL, CHOLHDL, VLDL, LDLCALC, LDLDIRECT HEMOGLOBIN A1C Lab Results  Component Value Date   HGBA1C 5.8 (H) 09/18/2015   MPG 120 09/18/2015   TSH No results for input(s): TSH in the last 8760 hours.  External labs:  04/07/2019: HB 15.2/HCT 44.7, platelets 200.  Potassium 4.8, BUN 14, creatinine 1.0, eGFR greater than 60 minutes, total cholesterol 118, triglycerides 80, HDL 51, LDL 67.  PSA normal.  Labs 10/13/2018: HB 15.4/HCT 46.0, platelets 263.   BUN 12, creatinine 1.05, eGFR greater than 60 him a, CMP otherwise normal.  Potassium 4.7.  Total cholesterol 117, triglycerides 102, HDL 45, LDL 52.  Medications and allergies  No Known Allergies   Prior to Admission medications   Medication Sig Start Date End Date Taking? Authorizing Provider  aspirin EC 81 MG tablet Take 81 mg by mouth every morning.     [provider]  metoprolol succinate (TOPROL-XL) 50 MG 24 hr tablet Take 1 tablet (50 mg total) by mouth daily. Take with or immediately following a meal. 04/06/19   Adrian Prows, MD  rosuvastatin (CRESTOR) 10 MG tablet Take 1 tablet (10 mg total) by mouth daily. 04/06/19   Adrian Prows, MD     Current Outpatient Medications  Medication Instructions  . aspirin EC 81 mg, Oral,  Every morning - 10a  . rosuvastatin (CRESTOR) 10 MG tablet TAKE 1 TABLET DAILY  . TOPROL XL 50 MG 24 hr tablet TAKE 1 TABLET DAILY WITH OR IMMEDIATELY FOLLOWING A MEAL    Radiology:    No results found.  Cardiac Studies:   Bentall Procedure 09/20/15: ascending aortic aneurysm repair with 30 mm Gelweave Valsalva graft and [employer] pericardial tissue valve 25 mm model 3300 TFX  CT angiogram chest 12/05/2016: Expected postoperative appearance after Bentall procedure with replacement of aortic valve, aortic root and descending thoracic aorta. 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  Echocardiogram 09/15/2018: Left ventricle cavity is normal in size. Mild asymmetric hypertrophy of the left ventricle. Normal global wall motion. Normal diastolic filling pattern. Calculated EF 67%. Left atrial cavity is mildly dilated. Well seated bioprosthetic aortic valve. Mean PG 8 mmHg. No significant stenosis or regurgitation noted. Inadequate tricuspid regurgitation jet to estimate pulmonary artery pressure. Normal right atrial pressure.  No significant  change compare to previous study on 07/24/2017.  Carotid artery duplex  09/06/2019: Minimal soft plaque noted in bilateral carotid arteries without hemodynamically significant stenosis. Right vertebral artery flow is not visualized. Antegrade left vertebral artery flow. Follow up studies when clinically indicated.  CT angiogram chest 09/20/2019:  Redemonstration of surgical changes of Bentall procedure and left atrial clipping Native coronary artery atherosclerosis. Aortic Atherosclerosis (ICD10-I70.0). Centrilobular emphysema.  Emphysema (ICD10-J43.9). 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  EKG:  EKG 08/21/2020: Normal sinus rhythm at a rate of 70 bpm, left atrial enlargement.  Normal axis.  No evidence of ischemia.  Borderline low voltage complexes.  Compared to EKG 08/19/2018, no significant change.   Assessment     ICD-10-CM   1. S/P ascending aortic replacement  Z95.828 EKG 12-Lead    PCV ECHOCARDIOGRAM COMPLETE  2. S/P AVR  Z95.2 EKG 12-Lead    PCV ECHOCARDIOGRAM COMPLETE  3. Mild hyperlipidemia  E78.5   4. Left carotid bruit  R09.89     No orders of the defined types were placed in this encounter.  There are no discontinued medications.   Recommendations:   Chad Lawrence  is a 69 y.o. caucasian male with history of mild hyperlipidemia,  thoracic aortic aneurysm  underwent Bentall procedure with replacement of aortic valve, aortic regurgitation, LAA ligation and modified maze procedure and reimplantation of his coronary arteries on 09/20/2015 by Dr. Servando Snare. History of atrial fibrillation post-op in 2016, has maintained sinus rhythm since. No history of hypertension or diabetes mellitus.   Patient presents for annual follow-up of ascending aortic aneurysm repair, aortic regurgitation, left carotid bruit.  Reviewed results of CT angiogram chest 09/20/2019 and carotid artery duplex 09/06/2019.  Aortic root repair is stable, and carotid artery  duplex revealed minimal soft plaque bilaterally without significant stenosis.  Patient follows with his PCP for mild hyperlipidemia, he is presently taking rosuvastatin 10 mg daily.  Reviewed external labs, last lipid panel on 04/07/2019 revealed lipids in excellent control.  Blood pressure also remains well controlled, will continue 50 mg metoprolol succinate daily.  Patient does have early systolic murmur noted on exam at the right upper sternal border.  His last echocardiogram was done in 2019.  Will obtain repeat echocardiogram at this time.  EKG remains unchanged and patient remains stable, without symptoms.  Follow-up in 1 year for ascending aortic aneurysm repair, or sooner as needed.  Patient was seen in collaboration with Dr. Einar Gip. He also reviewed patient's chart and examined the patient. Dr. Einar Gip is in agreement of the plan.    Alethia Berthold, PA-C 08/21/2020, 9:56 AM Office: [phone]

## 2020-08-21 NOTE — Progress Notes (Deleted)
Primary Physician/Referring:  [name], MD  Patient ID: Chad Lawrence, male    DOB: 03/04/1951, 69 y.o.   MRN: [id number]  No chief complaint on file.  HPI:    Chad Lawrence  is a 69 y.o. 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 Servando Snare. He did have persistant A. Fib post operatively and converted to sinus 2-3 weeks later and has maintained sinus since. He has no h/o hypertension, DM. He has Mild hyperlipidemia presently on statin.  Essentially asymptomatic and presents for one year f/u. Continues to be physically active walking 12K steps a day.   ***  Past Medical History:  Diagnosis Date  . Aortic regurgitation   . Ascending aorta dilatation (HCC)   . Atrial fibrillation ([place])   . Hyperlipidemia   . Pneumonia    Past Surgical History:  Procedure Laterality Date  . AORTIC VALVE REPLACEMENT    . BENTALL PROCEDURE N/A 09/20/2015   Procedure: BENTALL PROCEDURE;  Surgeon: [name], MD;  Location: [place] [facility];  Service: Open Heart Surgery;  Laterality: N/A;  . CARDIAC CATHETERIZATION N/A 09/12/2015   Procedure: Left Heart Cath and Coronary Angiography;  Surgeon: Adrian Prows, MD;  Location: [facility];  Service: Cardiovascular;  Laterality: N/A;  . CLIPPING OF ATRIAL APPENDAGE N/A 09/20/2015   Procedure: CLIPPING OF ATRIAL APPENDAGE;  Surgeon: [name], MD;  Location: [facility];  Service: Open Heart Surgery;  Laterality: N/A;  . COLONOSCOPY     x 2  . HERNIA REPAIR    . MAZE N/A 09/20/2015   Procedure: MAZE;  Surgeon: [name], MD;  Location: [facility];  Service: Open Heart Surgery;  Laterality: N/A;  . TEE WITHOUT CARDIOVERSION N/A 09/20/2015   Procedure: TRANSESOPHAGEAL ECHOCARDIOGRAM  (TEE);  Surgeon: [name], MD;  Location: [facility];  Service: Open Heart Surgery;  Laterality: N/A;   Social History   Tobacco Use  . Smoking status: Never Smoker  . Smokeless tobacco: Never Used  Substance Use Topics  . Alcohol use: Yes    Alcohol/week: 0.0 standard drinks    Comment: occasional   Marital Status: Married  ROS  Review of Systems  Constitutional: Negative for chills, decreased appetite, malaise/fatigue and weight gain.  Cardiovascular: Negative for dyspnea on exertion, leg swelling and syncope.  Endocrine: Negative for cold intolerance.  Hematologic/Lymphatic: Does not bruise/bleed easily.  Musculoskeletal: Negative for joint swelling.  Gastrointestinal: Negative for abdominal pain, anorexia, change in bowel habit, hematochezia and melena.  Neurological: Negative for headaches and light-headedness.  Psychiatric/Behavioral: Negative for depression and substance abuse.  All other systems reviewed and are negative.  Objective   Vitals with BMI 08/21/2020 08/23/2019 12/05/2016  Height _0  _1  _2   Weight 191 lbs 190 lbs 185 lbs  BMI 25.9 41.58 30.9  Systolic 407 680 881  Diastolic 82 80 73  Pulse 73 63 62    There were no vitals taken for this visit. There is no height or weight on file to calculate BMI.   Physical Exam Constitutional:      General: He is not in acute distress.    Appearance: He is well-developed.  HENT:     Head: Atraumatic.  Eyes:     Conjunctiva/sclera: Conjunctivae normal.  Neck:  Thyroid: No thyromegaly.     Vascular: No JVD.  Cardiovascular:     Rate and Rhythm: Normal rate and regular rhythm.     Pulses: Normal pulses and intact distal pulses.     Heart sounds: S1 normal and S2 normal. Murmur heard.  Scratchy early systolic murmur is present with a grade of 2/6 at the upper right sternal border radiating to the neck.  No gallop.   Pulmonary:     Effort: Pulmonary effort is normal.     Breath sounds: Normal breath  sounds.  Abdominal:     General: Bowel sounds are normal.     Palpations: Abdomen is soft.  Musculoskeletal:        General: Normal range of motion.     Cervical back: Neck supple.  Skin:    General: Skin is warm and dry.  Neurological:     Mental Status: He is alert.    Laboratory examination:   04/07/2019: HB 15.2/HCT 44.7, platelets 200.  Potassium 4.8, BUN 14, creatinine 1.0, eGFR greater than 60 minutes, total cholesterol 118, triglycerides 80, HDL 51, LDL 67.  PSA normal.  Labs 10/13/2018: HB 15.4/HCT 46.0, platelets 263.  BUN 12, creatinine 1.05, eGFR greater than 60 him a, CMP otherwise normal.  Potassium 4.7.  Total cholesterol 117, triglycerides 102, HDL 45, LDL 52.  No results for input(s): NA, K, CL, CO2, GLUCOSE, BUN, CREATININE, CALCIUM, GFRNONAA, GFRAA in the last 8760 hours. CMP Latest Ref Rng & Units 09/26/2015 09/25/2015 09/24/2015  Glucose 65 - 99 mg/dL - 214(A) 691(C)  BUN 6 - 20 mg/dL - 19 58(Q)  Creatinine 0.61 - 1.24 mg/dL - 6.91 9.12  Sodium 443 - 145 mmol/L - 135 133(L)  Potassium 3.5 - 5.1 mmol/L - 3.9 4.0  Chloride 101 - 111 mmol/L - 97(L) 98(L)  CO2 22 - 32 mmol/L - 28 26  Calcium 8.9 - 10.3 mg/dL - 8.5(L) 8.4(L)  Total Protein 6.5 - 8.1 g/dL 9.0(J) - 5.6(L)  Total Bilirubin 0.3 - 1.2 mg/dL 8.3(V) - 0.9  Alkaline Phos 38 - 126 U/L 84 - 65  AST 15 - 41 U/L 89(H) - 215(H)  ALT 17 - 63 U/L 307(H) - 388(H)   CBC Latest Ref Rng & Units 09/25/2015 09/24/2015 09/23/2015  WBC 4.0 - 10.5 K/uL 11.0(H) 13.1(H) 14.9(H)  Hemoglobin 13.0 - 17.0 g/dL 10.3(L) 10.3(L) 10.7(L)  Hematocrit 39 - 52 % 31.5(L) 31.1(L) 32.0(L)  Platelets 150 - 400 K/uL 207 169 125(L)   Lipid Panel  No results found for: CHOL, TRIG, HDL, CHOLHDL, VLDL, LDLCALC, LDLDIRECT   HEMOGLOBIN A1C Lab Results  Component Value Date   HGBA1C 5.8 (H) 09/18/2015   MPG 120 09/18/2015   TSH No results for input(s): TSH in the last 8760 hours.    Medications and allergies  No Known Allergies    Prior to Admission medications   Medication Sig Start Date End Date Taking? Authorizing Provider  aspirin EC 81 MG tablet Take 81 mg by mouth every morning.     [provider]  metoprolol succinate (TOPROL-XL) 50 MG 24 hr tablet Take 1 tablet (50 mg total) by mouth daily. Take with or immediately following a meal. 04/06/19   Yates Decamp, MD  rosuvastatin (CRESTOR) 10 MG tablet Take 1 tablet (10 mg total) by mouth daily. 04/06/19   Yates Decamp, MD     Current Outpatient Medications  Medication Instructions  . aspirin EC 81 mg, Oral,  Every morning - 10a  .  rosuvastatin (CRESTOR) 10 MG tablet TAKE 1 TABLET DAILY  . TOPROL XL 50 MG 24 hr tablet TAKE 1 TABLET DAILY WITH OR IMMEDIATELY FOLLOWING A MEAL    Radiology:    No results found.  Cardiac Studies:   Bentall Procedure 09/20/15: ascending aortic aneurysm repair with 30 mm Gelweave Valsalva graft and [employer] pericardial tissue valve 25 mm model 3300 TFX  CT angiogram chest 12/05/2016: Expected postoperative appearance after Bentall procedure with replacement of aortic valve, aortic root and descending thoracic aorta. 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  Echocardiogram 09/15/2018: Left ventricle cavity is normal in size. Mild asymmetric hypertrophy of the left ventricle. Normal global wall motion. Normal diastolic filling pattern. Calculated EF 67%. Left atrial cavity is mildly dilated. Well seated bioprosthetic aortic valve. Mean PG 8 mmHg. No significant stenosis or regurgitation noted. Inadequate tricuspid regurgitation jet to estimate pulmonary artery pressure. Normal right atrial pressure.  No significant change compare to previous study on 07/24/2017.  Carotid artery duplex  09/06/2019: Minimal soft plaque noted in bilateral carotid arteries without hemodynamically significant stenosis. Right vertebral  artery flow is not visualized. Antegrade left vertebral artery flow. Follow up studies when clinically indicated.  CT angiogram chest 09/20/2019:  Redemonstration of surgical changes of Bentall procedure and left atrial clipping Native coronary artery atherosclerosis. Aortic Atherosclerosis (ICD10-I70.0). Centrilobular emphysema.  Emphysema (ICD10-J43.9). 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  EKG:  EKG 08/21/2020: Normal sinus rhythm at a rate of 70 bpm, left atrial enlargement.  Normal axis.  No evidence of ischemia.  Borderline low voltage complexes.  Compared to EKG 08/19/2018, no significant change.   Assessment     ICD-10-CM   1. S/P AVR  Z95.2   2. S/P ascending aortic replacement  Z95.828   3. Mild hyperlipidemia  E78.5   4. Left carotid bruit  R09.89     No orders of the defined types were placed in this encounter.  There are no discontinued medications.   Recommendations:   Patient is here on annual visit and follow-up of ascending aortic aneurysm repair, aortic regurgitation, he is presently doing well, I do hear a new left carotid bruit, probably conducted sounds from the aortic valve.  I'll obtain a carotid artery duplex.  It is been almost 3 years since CT angiogram of the chest, to follow-up on the aneurysm repair I'll obtain this.  I reviewed his labs and PCP, lipids are under excellent control.  Otherwise no changes physical examination EKG remains unchanged as well.  I'll see him back on an annual basis.  ***Lipid panel?

## 2020-09-18 ENCOUNTER — Other Ambulatory Visit: Payer: Self-pay | Admitting: Cardiology

## 2020-09-18 ENCOUNTER — Ambulatory Visit: Payer: Medicare Other

## 2020-09-18 ENCOUNTER — Other Ambulatory Visit: Payer: Self-pay

## 2020-09-18 DIAGNOSIS — Z95828 Presence of other vascular implants and grafts: Secondary | ICD-10-CM

## 2020-09-18 DIAGNOSIS — Z952 Presence of prosthetic heart valve: Secondary | ICD-10-CM

## 2020-09-21 NOTE — Progress Notes (Signed)
Please inform patient his echo is unchanged compared to his last one. Normal heart function.

## 2020-09-21 NOTE — Progress Notes (Signed)
Spoke to patient he is aware

## 2020-10-06 ENCOUNTER — Other Ambulatory Visit: Payer: Self-pay | Admitting: Cardiology

## 2020-11-03 NOTE — Progress Notes (Signed)
Please let patient know his labs are normal. Will defer management of A1C to PCP. Lipids (cholesterol) is well controlled.

## 2020-11-03 NOTE — Progress Notes (Signed)
Patient called back I have discussed results with him.

## 2020-11-03 NOTE — Progress Notes (Signed)
Called patient, NA, LMAM

## 2021-04-30 ENCOUNTER — Telehealth: Payer: Self-pay

## 2021-04-30 NOTE — Progress Notes (Signed)
Primary Physician/Referring:  [name], MD  Patient ID: Chad Lawrence, male    DOB: 06-Feb-1951, 70 y.o.   MRN: [id number]  Chief Complaint  Patient presents with   Tachycardia   Palpitations   HPI:    Chad Lawrence  is a 70 y.o. 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 Servando Snare. He did have persistant A. Fib post operatively and converted to sinus 2-3 weeks later and has maintained sinus since. He has no h/o hypertension, DM. He has Mild hyperlipidemia presently on statin.  Patient was last seen in our office 08/21/2020 for annual visit at which time he was doing well and advised to follow-up in another 1 year. However patient called our office yesterday with concerns of dizziness for the last several weeks as well as elevated blood pressure, he now presents for urgent visit.  Patient reports he was seen by his PCP yesterday who noted "irregular heart rate while listening to his heart".  Patient has also been experiencing intermittent lightheadedness with positional changes over the last 1 to 2 months particularly at night when getting up to use the bathroom.  Patient's wife is present at bedside and also noticed patient seems to be generally more fatigued over the last 6 months.  Patient himself admits to mild increase in dyspnea on exertion.  He presently averages 7-10,000 steps per day.  He denies chest pain, syncope, near syncope, leg swelling, orthopnea.  Past Medical History:  Diagnosis Date   Aortic regurgitation    Ascending aorta dilatation (HCC)    Atrial fibrillation ([place])    Hyperlipidemia    Pneumonia    Past Surgical History:  Procedure Laterality Date   AORTIC VALVE REPLACEMENT     BENTALL  PROCEDURE N/A 09/20/2015   Procedure: BENTALL PROCEDURE;  Surgeon: [name], MD;  Location: [place];  Service: Open Heart Surgery;  Laterality: N/A;   CARDIAC CATHETERIZATION N/A 09/12/2015   Procedure: Left Heart Cath and Coronary Angiography;  Surgeon: Adrian Prows, MD;  Location: [facility];  Service: Cardiovascular;  Laterality: N/A;   CLIPPING OF ATRIAL APPENDAGE N/A 09/20/2015   Procedure: CLIPPING OF ATRIAL APPENDAGE;  Surgeon: [name], MD;  Location: [place];  Service: Open Heart Surgery;  Laterality: N/A;   COLONOSCOPY     x 2   HERNIA REPAIR     MAZE N/A 09/20/2015   Procedure: MAZE;  Surgeon: [name], MD;  Location: [place];  Service: Open Heart Surgery;  Laterality: N/A;   TEE WITHOUT CARDIOVERSION N/A 09/20/2015   Procedure: TRANSESOPHAGEAL ECHOCARDIOGRAM (TEE);  Surgeon: [name], MD;  Location: [facility];  Service: Open Heart Surgery;  Laterality: N/A;   Family History  Problem Relation Age of Onset   Hypertension Mother    Breast cancer Mother    Atrial fibrillation Mother    Emphysema Father    Aneurysm Father        abdominal aortic aneurysm   COPD Father     Social History   Tobacco Use   Smoking status: Never   Smokeless tobacco: Never  Substance Use Topics   Alcohol use: Yes    Alcohol/week: 0.0 standard drinks    Comment: occasional   Marital Status: Married  ROS  Review of  Systems  Constitutional: Positive for malaise/fatigue.  Cardiovascular:  Positive for dyspnea on exertion. Negative for chest pain, claudication, leg swelling, near-syncope, palpitations and syncope.  Hematologic/Lymphatic: Does not bruise/bleed easily.  Gastrointestinal:  Negative for change in bowel habit, hematochezia and melena.  Neurological:  Negative for headaches and light-headedness.  All other systems reviewed and are negative. Objective   Vitals with BMI 05/01/2021 08/21/2020 08/23/2019  Height '6\' 0"'  '6\' 0"'  '6\' 2"'   Weight 185 lbs 191 lbs 190  lbs  BMI 25.08 51.7 61.60  Systolic 737 106 269  Diastolic 86 76 80  Pulse 66 73 63    Blood pressure 122/86, pulse 66, temperature 98.3 F (36.8 C), height 6' (1.829 m), weight 185 lb (83.9 kg), SpO2 98 %. Body mass index is 25.09 kg/m.  Physical Exam Constitutional:      General: He is not in acute distress.    Appearance: He is well-developed.  HENT:     Head: Atraumatic.  Neck:     Thyroid: No thyromegaly.     Vascular: No JVD.  Cardiovascular:     Rate and Rhythm: Normal rate. Rhythm irregular.     Pulses: Intact distal pulses.          Carotid pulses are 2+ on the right side and 2+ on the left side with bruit.      Radial pulses are 2+ on the right side and 2+ on the left side.       Popliteal pulses are 2+ on the right side and 2+ on the left side.       Dorsalis pedis pulses are 1+ on the right side and 1+ on the left side.       Posterior tibial pulses are 2+ on the right side and 2+ on the left side.     Heart sounds: S1 normal and S2 normal. Murmur heard.  Scratchy early systolic murmur is present with a grade of 2/6 at the upper right sternal border radiating to the neck.    No gallop.     Comments: No JVD. No leg edema.  Pulmonary:     Effort: Pulmonary effort is normal.     Breath sounds: Normal breath sounds. No wheezing, rhonchi or rales.  Abdominal:     General: Bowel sounds are normal.     Palpations: Abdomen is soft.  Skin:    General: Skin is warm and dry.  Neurological:     Mental Status: He is alert.   Laboratory examination:   CMP Latest Ref Rng & Units 09/26/2015 09/25/2015 09/24/2015  Glucose 65 - 99 mg/dL - 114(H) 108(H)  BUN 6 - 20 mg/dL - 19 23(H)  Creatinine 0.61 - 1.24 mg/dL - 1.10 1.10  Sodium 135 - 145 mmol/L - 135 133(L)  Potassium 3.5 - 5.1 mmol/L - 3.9 4.0  Chloride 101 - 111 mmol/L - 97(L) 98(L)  CO2 22 - 32 mmol/L - 28 26  Calcium 8.9 - 10.3 mg/dL - 8.5(L) 8.4(L)  Total Protein 6.5 - 8.1 g/dL 5.7(L) - 5.6(L)  Total Bilirubin 0.3  - 1.2 mg/dL 1.3(H) - 0.9  Alkaline Phos 38 - 126 U/L 84 - 65  AST 15 - 41 U/L 89(H) - 215(H)  ALT 17 - 63 U/L 307(H) - 388(H)   CBC Latest Ref Rng & Units 09/25/2015 09/24/2015 09/23/2015  WBC 4.0 - 10.5 K/uL 11.0(H) 13.1(H) 14.9(H)  Hemoglobin 13.0 - 17.0 g/dL 10.3(L) 10.3(L) 10.7(L)  Hematocrit 39.0 - 52.0 % 31.5(L) 31.1(L) 32.0(L)  Platelets 150 - 400 K/uL 207 169 125(L)   Lipid Panel  No results found for: CHOL, TRIG, HDL, CHOLHDL, VLDL, LDLCALC, LDLDIRECT HEMOGLOBIN A1C Lab Results  Component Value Date   HGBA1C 5.8 (H) 09/18/2015   MPG 120 09/18/2015   TSH No results for input(s): TSH in the last 8760 hours.  External labs:  04/07/2019: HB 15.2/HCT 44.7, platelets 200.  Potassium 4.8, BUN 14, creatinine 1.0, eGFR greater than 60 minutes, total cholesterol 118, triglycerides 80, HDL 51, LDL 67.  PSA normal.  Labs 10/13/2018: HB 15.4/HCT 46.0, platelets 263.  BUN 12, creatinine 1.05, eGFR greater than 60 him a, CMP otherwise normal.  Potassium 4.7.  Total cholesterol 117, triglycerides 102, HDL 45, LDL 52. Allergies  No Known Allergies    Medications Prior to Visit:   Outpatient Medications Prior to Visit  Medication Sig Dispense Refill   aspirin EC 81 MG tablet Take 81 mg by mouth every morning.      rosuvastatin (CRESTOR) 10 MG tablet TAKE 1 TABLET DAILY 90 tablet 3   metoprolol succinate (TOPROL-XL) 50 MG 24 hr tablet TAKE 1 TABLET DAILY WITH OR IMMEDIATELY FOLLOWING A MEAL 90 tablet 3   clotrimazole-betamethasone (LOTRISONE) cream SMARTSIG:0.5 Inch(es) Topical Twice Daily     No facility-administered medications prior to visit.     Final Medications at End of Visit    Current Meds  Medication Sig   aspirin EC 81 MG tablet Take 81 mg by mouth every morning.    metoprolol tartrate (LOPRESSOR) 50 MG tablet Take 1 tablet (50 mg total) by mouth 2 (two) times daily.   rosuvastatin (CRESTOR) 10 MG tablet TAKE 1 TABLET DAILY   [DISCONTINUED] metoprolol succinate  (TOPROL-XL) 50 MG 24 hr tablet TAKE 1 TABLET DAILY WITH OR IMMEDIATELY FOLLOWING A MEAL      Radiology:    No results found.  Cardiac Studies:   Bentall Procedure 09/20/15: ascending aortic aneurysm repair with 30 mm Gelweave Valsalva graft and [employer] pericardial tissue valve 25 mm model 3300 TFX  CT angiogram chest 12/05/2016: Expected postoperative appearance after Bentall procedure with replacement of aortic valve, aortic root and descending thoracic aorta. 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  Carotid artery duplex  09/06/2019: Minimal soft plaque noted in bilateral carotid arteries without hemodynamically significant stenosis. Right vertebral artery flow is not visualized. Antegrade left vertebral artery flow. Follow up studies when clinically indicated.  CT angiogram chest 09/20/2019:  Redemonstration of surgical changes of Bentall procedure and left atrial clipping Native coronary artery atherosclerosis. Aortic Atherosclerosis (ICD10-I70.0). Centrilobular emphysema.  Emphysema (ICD10-J43.9). 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  Echocardiogram 09/18/2020:  Left ventricle cavity is normal in size. Moderate concentric hypertrophy  of the left ventricle. Abnormal septal wall motion due to post-operative  valve. Normal LV systolic function with EF 62%. Indeterminate diastolic filling pattern.  S/p [facility] pericardial tissue valve 25 mm with normal functioning. No valvular or paravalvular regurgitation.  Normal right atrial pressure.  No significant change compared to previous study on 09/16/2019.   EKG   05/04/2021: Atrial fibrillation with rapid ventricular response at a rate of 100 bpm.  Normal axis.  No evidence of ischemia or underlying injury pattern.  08/21/2020: Normal sinus rhythm at a rate of 70 bpm, left  atrial enlargement.  Normal axis.  No evidence of ischemia.  Borderline low voltage complexes.  Compared to EKG 08/19/2018, no significant change.   Assessment     ICD-10-CM   1. S/P ascending aortic replacement  Z95.828 EKG 12-Lead    2. S/P AVR  Z95.2     3. Dizziness  R42     4. Paroxysmal atrial fibrillation (HCC)  I48.0 LONG TERM MONITOR (3-14 DAYS)    5. Decreased exercise tolerance  R68.89 PCV ECHOCARDIOGRAM COMPLETE    6. Dyspnea on exertion  R06.00 PCV ECHOCARDIOGRAM COMPLETE      Meds ordered this encounter  Medications   metoprolol tartrate (LOPRESSOR) 50 MG tablet    Sig: Take 1 tablet (50 mg total) by mouth 2 (two) times daily.    Dispense:  60 tablet    Refill:  3   Medications Discontinued During This Encounter  Medication Reason   metoprolol succinate (TOPROL-XL) 50 MG 24 hr tablet Change in therapy   This patients CHA2DS2-VASc Score 1 (age) and yearly risk of stroke 0.6%.   Recommendations:   KRESTON AHRENDT  is a 70 y.o. caucasian male with history of mild hyperlipidemia,  thoracic aortic aneurysm  underwent Bentall procedure with replacement of aortic valve, aortic regurgitation, LAA ligation and modified maze procedure and reimplantation of his coronary arteries on 09/20/2015 by Dr. Servando Snare. History of atrial fibrillation post-op in 2016, has maintained sinus rhythm since. No history of hypertension or diabetes mellitus.   Patient was last seen in our office 08/21/2020 for annual visit at which time he was doing well and advised to follow-up in another 1 year.However patient called our office yesterday with concerns of dizziness for the last several weeks as well as elevated blood pressure, he now presents for urgent visit.  Patient has had no recurrence of atrial fibrillation since 2016 until today.  Patient's EKG reveals atrial fibrillation at a rate of 100 bpm.  Patient's CHA2DS2-VASc score is 1 given his age, therefore anticoagulation is not indicated at  this time.  Suspect patient's fatigue and intermittent lightheadedness may be related to underlying atrial fibrillation.  Will obtain ambulatory cardiac telemetry to evaluate atrial fibrillation burden.  We will also start patient on metoprolol tartrate 50 mg twice daily to improve rate control.  We will also obtain echocardiogram given recurrence of atrial fibrillation to reevaluate left ventricular systolic function.  Blood pressure remains well controlled.  Follow-up as previously scheduled in October, or sooner as needed.   Alethia Berthold, PA-C 05/04/2021, 3:04 PM Office: [phone]

## 2021-04-30 NOTE — Telephone Encounter (Signed)
Called and spoke to pts wife regarding appointment. She is being scheduled up front with [facility].

## 2021-04-30 NOTE — Telephone Encounter (Signed)
Please set patient up for appt in the office, with me is fine.

## 2021-04-30 NOTE — Telephone Encounter (Signed)
Pt called because he has had a few episodes of dizziness lately over the last several weeks. His bp has been up and his his heart rate has been fast. PT went to his PCP to get checked out. Pts hr was in the 80. Pt does not not know what his bp was in at his doctors appointment. Had pt check bp while on the phone, it was 139/90. Pt also has a rash down in his groin area. He saw his PCP and got a steroid cream and shot. Last time this happened was when he had his aneurysm and the same symptoms are occurring again.

## 2021-05-01 ENCOUNTER — Encounter: Payer: Self-pay | Admitting: Student

## 2021-05-01 ENCOUNTER — Other Ambulatory Visit: Payer: Self-pay

## 2021-05-01 ENCOUNTER — Ambulatory Visit: Payer: Medicare Other | Admitting: Student

## 2021-05-01 ENCOUNTER — Other Ambulatory Visit: Payer: Medicare Other

## 2021-05-01 VITALS — BP 122/86 | HR 66 | Temp 98.3°F | Ht 72.0 in | Wt 185.0 lb

## 2021-05-01 DIAGNOSIS — R0600 Dyspnea, unspecified: Secondary | ICD-10-CM

## 2021-05-01 DIAGNOSIS — I480 Paroxysmal atrial fibrillation: Secondary | ICD-10-CM

## 2021-05-01 DIAGNOSIS — Z95828 Presence of other vascular implants and grafts: Secondary | ICD-10-CM

## 2021-05-01 DIAGNOSIS — R6889 Other general symptoms and signs: Secondary | ICD-10-CM

## 2021-05-01 DIAGNOSIS — R42 Dizziness and giddiness: Secondary | ICD-10-CM

## 2021-05-01 DIAGNOSIS — Z952 Presence of prosthetic heart valve: Secondary | ICD-10-CM

## 2021-05-01 DIAGNOSIS — R0609 Other forms of dyspnea: Secondary | ICD-10-CM

## 2021-05-01 MED ORDER — METOPROLOL TARTRATE 50 MG PO TABS: 50.0000 mg | ORAL_TABLET | Freq: Two times a day (BID) | ORAL | 3 refills | Status: DC

## 2021-05-10 ENCOUNTER — Other Ambulatory Visit: Payer: Self-pay

## 2021-05-10 ENCOUNTER — Ambulatory Visit: Payer: Medicare Other

## 2021-05-10 DIAGNOSIS — R0609 Other forms of dyspnea: Secondary | ICD-10-CM

## 2021-05-10 DIAGNOSIS — R0600 Dyspnea, unspecified: Secondary | ICD-10-CM

## 2021-05-10 DIAGNOSIS — R6889 Other general symptoms and signs: Secondary | ICD-10-CM

## 2021-06-09 NOTE — Progress Notes (Signed)
Please inform pt his monitor showed continuous a fib. Please set him up for appt to see me to discuss further at some point this week.

## 2021-06-11 NOTE — Progress Notes (Signed)
Called pt to inform him about his monitor pt was transferred to the front to sche an appt for a f/u

## 2021-06-12 NOTE — Progress Notes (Signed)
Primary Physician/Referring:  [name], MD  Patient ID: Chad Lawrence, male    DOB: 1951/02/16, 70 y.o.   MRN: [id number]  Chief Complaint  Patient presents with   Atrial Fibrillation   Follow-up   Results   HPI:    Chad Lawrence  is a 70 y.o. 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 Servando Snare. He did have persistant A. Fib post operatively and converted to sinus 2-3 weeks later and has maintained sinus since. He has no h/o hypertension, DM. He has Mild hyperlipidemia presently on statin.  Patient was last seen in our office 05/01/2021 for urgent visit with complaints of dizziness, at this time EKG revealed atrial fibrillation.  Given patient's CHA2DS2-VASc score of 1 did not start anticoagulation, however did obtain ambulatory cardiac telemetry in order to evaluate atrial fibrillation burden, also started metoprolol titrate 50 mg twice daily to improve rate control.  Patient now presents for follow-up as ambulatory cardiac telemetry revealed patient has remained in atrial fibrillation for the duration of the monitoring 2 weeks.   Patient's primary concern today is increase episodes of brief lightheadedness when standing and mildly worsened fatigue since increased metoprolol dose. Home BP log revealed episodes of systolic BP < 978 mmHg. He has also had some dyspnea on exertion. Denies chest pain, palpitations, syncope, near-syncope.   Past Medical History:  Diagnosis Date   Aortic regurgitation    Ascending aorta dilatation (HCC)    Atrial fibrillation ([place])    Hyperlipidemia    Pneumonia    Past Surgical History:  Procedure Laterality Date   AORTIC VALVE REPLACEMENT     BENTALL PROCEDURE N/A 09/20/2015    Procedure: BENTALL PROCEDURE;  Surgeon: [name], MD;  Location: [place] [facility];  Service: Open Heart Surgery;  Laterality: N/A;   CARDIAC CATHETERIZATION N/A 09/12/2015   Procedure: Left Heart Cath and Coronary Angiography;  Surgeon: Adrian Prows, MD;  Location: [facility];  Service: Cardiovascular;  Laterality: N/A;   CLIPPING OF ATRIAL APPENDAGE N/A 09/20/2015   Procedure: CLIPPING OF ATRIAL APPENDAGE;  Surgeon: [name], MD;  Location: [place];  Service: Open Heart Surgery;  Laterality: N/A;   COLONOSCOPY     x 2   HERNIA REPAIR     MAZE N/A 09/20/2015   Procedure: MAZE;  Surgeon: [name], MD;  Location: [place];  Service: Open Heart Surgery;  Laterality: N/A;   TEE WITHOUT CARDIOVERSION N/A 09/20/2015   Procedure: TRANSESOPHAGEAL ECHOCARDIOGRAM (TEE);  Surgeon: [name], MD;  Location: [place];  Service: Open Heart Surgery;  Laterality: N/A;   Family History  Problem Relation Age of Onset   Hypertension Mother    Breast cancer Mother    Atrial fibrillation Mother    Emphysema Father    Aneurysm Father        abdominal aortic aneurysm   COPD Father     Social History   Tobacco Use   Smoking status: Never   Smokeless tobacco: Never  Substance Use Topics   Alcohol use: Yes    Alcohol/week: 0.0 standard drinks    Comment: occasional   Marital Status: Married  ROS  Review of Systems  Constitutional: Positive for malaise/fatigue.  Cardiovascular:  Positive for dyspnea on exertion. Negative for chest pain,  claudication, leg swelling, near-syncope, palpitations and syncope.  Hematologic/Lymphatic: Does not bruise/bleed easily.  Gastrointestinal:  Negative for change in bowel habit, hematochezia and melena.  Neurological:  Positive for light-headedness. Negative for headaches.  All other systems reviewed and are negative. Objective   Vitals with BMI 06/13/2021 05/01/2021 08/21/2020  Height $Remov'6\' 0"'zZiOdA$  $Remove'6\' 0"'aLDjVym$  $RemoveB'6\' 0"'dFLsJLeI$   Weight 193 lbs 185 lbs 191 lbs  BMI 26.17  68.34 19.6  Systolic 222 979 892  Diastolic 78 86 76  Pulse 70 66 73    Blood pressure 136/78, pulse 70, temperature 98 F (36.7 C), height 6' (1.829 m), weight 193 lb (87.5 kg), SpO2 96 %. Body mass index is 26.18 kg/m.  Physical Exam Vitals reviewed.  Constitutional:      General: He is not in acute distress.    Appearance: He is well-developed.  Neck:     Thyroid: No thyromegaly.     Vascular: No JVD.  Cardiovascular:     Rate and Rhythm: Normal rate. Rhythm irregular.     Pulses: Intact distal pulses.          Carotid pulses are 2+ on the right side and 2+ on the left side with bruit.      Radial pulses are 2+ on the right side and 2+ on the left side.       Popliteal pulses are 2+ on the right side and 2+ on the left side.       Dorsalis pedis pulses are 1+ on the right side and 1+ on the left side.       Posterior tibial pulses are 2+ on the right side and 2+ on the left side.     Heart sounds: S1 normal and S2 normal. Murmur heard.  Scratchy early systolic murmur is present with a grade of 2/6 at the upper right sternal border radiating to the neck.    No gallop.     Comments: No JVD. No leg edema.  Pulmonary:     Effort: Pulmonary effort is normal.     Breath sounds: Normal breath sounds. No wheezing, rhonchi or rales.  Neurological:     Mental Status: He is alert.   Laboratory examination:   CMP Latest Ref Rng & Units 09/26/2015 09/25/2015 09/24/2015  Glucose 65 - 99 mg/dL - 114(H) 108(H)  BUN 6 - 20 mg/dL - 19 23(H)  Creatinine 0.61 - 1.24 mg/dL - 1.10 1.10  Sodium 135 - 145 mmol/L - 135 133(L)  Potassium 3.5 - 5.1 mmol/L - 3.9 4.0  Chloride 101 - 111 mmol/L - 97(L) 98(L)  CO2 22 - 32 mmol/L - 28 26  Calcium 8.9 - 10.3 mg/dL - 8.5(L) 8.4(L)  Total Protein 6.5 - 8.1 g/dL 5.7(L) - 5.6(L)  Total Bilirubin 0.3 - 1.2 mg/dL 1.3(H) - 0.9  Alkaline Phos 38 - 126 U/L 84 - 65  AST 15 - 41 U/L 89(H) - 215(H)  ALT 17 - 63 U/L 307(H) - 388(H)   CBC Latest Ref Rng & Units  09/25/2015 09/24/2015 09/23/2015  WBC 4.0 - 10.5 K/uL 11.0(H) 13.1(H) 14.9(H)  Hemoglobin 13.0 - 17.0 g/dL 10.3(L) 10.3(L) 10.7(L)  Hematocrit 39.0 - 52.0 % 31.5(L) 31.1(L) 32.0(L)  Platelets 150 - 400 K/uL 207 169 125(L)   Lipid Panel  No results found for: CHOL, TRIG, HDL, CHOLHDL, VLDL, LDLCALC, LDLDIRECT HEMOGLOBIN A1C Lab Results  Component Value Date   HGBA1C 5.8 (H) 09/18/2015   MPG 120 09/18/2015   TSH No results for input(s): TSH  in the last 8760 hours.  External labs:  04/07/2019: HB 15.2/HCT 44.7, platelets 200.  Potassium 4.8, BUN 14, creatinine 1.0, eGFR greater than 60 minutes, total cholesterol 118, triglycerides 80, HDL 51, LDL 67.  PSA normal.  Labs 10/13/2018: HB 15.4/HCT 46.0, platelets 263.  BUN 12, creatinine 1.05, eGFR greater than 60 him a, CMP otherwise normal.  Potassium 4.7.  Total cholesterol 117, triglycerides 102, HDL 45, LDL 52.  Allergies  No Known Allergies   Medications Prior to Visit:   Outpatient Medications Prior to Visit  Medication Sig Dispense Refill   aspirin EC 81 MG tablet Take 81 mg by mouth every morning.      clotrimazole-betamethasone (LOTRISONE) cream SMARTSIG:0.5 Inch(es) Topical Twice Daily     rosuvastatin (CRESTOR) 10 MG tablet TAKE 1 TABLET DAILY 90 tablet 3   metoprolol tartrate (LOPRESSOR) 50 MG tablet Take 1 tablet (50 mg total) by mouth 2 (two) times daily. 60 tablet 3   No facility-administered medications prior to visit.   Final Medications at End of Visit    Current Meds  Medication Sig   apixaban (ELIQUIS) 5 MG TABS tablet Take 1 tablet (5 mg total) by mouth 2 (two) times daily.   aspirin EC 81 MG tablet Take 81 mg by mouth every morning.    clotrimazole-betamethasone (LOTRISONE) cream SMARTSIG:0.5 Inch(es) Topical Twice Daily   rosuvastatin (CRESTOR) 10 MG tablet TAKE 1 TABLET DAILY   [DISCONTINUED] metoprolol tartrate (LOPRESSOR) 50 MG tablet Take 1 tablet (50 mg total) by mouth 2 (two) times daily.   Radiology:     No results found.  Cardiac Studies:   Bentall Procedure 09/20/15: ascending aortic aneurysm repair with 30 mm [employer] Valsalva graft and [employer] pericardial tissue valve 25 mm model 3300 TFX  CT angiogram chest 12/05/2016: Expected postoperative appearance after Bentall procedure with replacement of aortic valve, aortic root and descending thoracic aorta. 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  Carotid artery duplex  09/06/2019: Minimal soft plaque noted in bilateral carotid arteries without hemodynamically significant stenosis. Right vertebral artery flow is not visualized. Antegrade left vertebral artery flow. Follow up studies when clinically indicated.  CT angiogram chest 09/20/2019:  Redemonstration of surgical changes of Bentall procedure and left atrial clipping Native coronary artery atherosclerosis. Aortic Atherosclerosis (ICD10-I70.0). Centrilobular emphysema.  Emphysema (ICD10-J43.9). 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  PCV ECHOCARDIOGRAM COMPLETE 36/64/4034 Normal LV systolic function with visual EF 55-60%. Left ventricle cavity is normal in size. Abnormal septal wall motion due to post-operative valve. Mild left ventricular hypertrophy. Normal global wall motion. Unable to evaluate diastolic function due to atrial fibrillation. Normal LAP. S/p [facility] pericardial tissue valve 25 mm with normal functioning. No valvular or paravalvular regurgitation. Mild (Grade I) mitral regurgitation. Mild pulmonic regurgitation. Compared to study dated 09/18/2020: no significant change.  Mobile cardiac telemetry 14 days 6/14 - 05/15/2021: Dominant rhythm: Atrial fibrillation A. fib present: 100% Heart rate 40-187 bpm, average heart rate 79 bpm 0 episodes, 0% isolated SVE 5 episodes of VT, fastest 187 bpm for 6 beats, longest  for 7 beats at 126 bpm 1.9% isolated VE, less than 1 couplet/triplet 100% A. fib burden seen. 1 pause in A. fib with R-R duration of 3 seconds noted.  Sinus pause greater than 3 seconds noted at 12:36 PM no associated symptoms No atrial flutter, SVT, high-grade AV block seen. 1  patient triggered event correlated with A. fib 80-90 bpm.  EKG  06/13/2021: Atrial fibrillation with controlled ventricular response at a rate of 79 bpm.  Normal axis.  No evidence of ischemia or underlying injury pattern  05/04/2021: Atrial fibrillation with rapid ventricular response at a rate of 100 bpm.  Normal axis.  No evidence of ischemia or underlying injury pattern.  08/21/2020: Normal sinus rhythm at a rate of 70 bpm, left atrial enlargement.  Normal axis.  No evidence of ischemia.  Borderline low voltage complexes.  Compared to EKG 08/19/2018, no significant change.   Assessment     ICD-10-CM   1. Persistent atrial fibrillation (HCC)  I48.19 EKG 12-Lead    apixaban (ELIQUIS) 5 MG TABS tablet    2. S/P ascending aortic replacement  Z95.828     3. S/P AVR  Z95.2       Meds ordered this encounter  Medications   DISCONTD: metoprolol tartrate (LOPRESSOR) 50 MG tablet    Sig: Take 0.5 tablets (25 mg total) by mouth 2 (two) times daily.    Dispense:  60 tablet    Refill:  3   metoprolol tartrate (LOPRESSOR) 25 MG tablet    Sig: Take 1 tablet (25 mg total) by mouth 2 (two) times daily.    Dispense:  180 tablet    Refill:  3   apixaban (ELIQUIS) 5 MG TABS tablet    Sig: Take 1 tablet (5 mg total) by mouth 2 (two) times daily.    Dispense:  60 tablet    Refill:  1   Medications Discontinued During This Encounter  Medication Reason   metoprolol tartrate (LOPRESSOR) 50 MG tablet    metoprolol tartrate (LOPRESSOR) 50 MG tablet Reorder   This patients CHA2DS2-VASc Score 1 (age) and yearly risk of stroke 0.6%.   Recommendations:   Chad Lawrence  is a 70 y.o. caucasian male with history of mild  hyperlipidemia,  thoracic aortic aneurysm  underwent Bentall procedure with replacement of aortic valve, aortic regurgitation, LAA ligation and modified maze procedure and reimplantation of his coronary arteries on 09/20/2015 by Dr. Servando Snare. History of atrial fibrillation post-op in 2016, has maintained sinus rhythm since. No history of hypertension or diabetes mellitus.   Patient was last seen in our office 05/01/2021 for urgent visit with complaints of dizziness, at this time EKG revealed atrial fibrillation.  Given patient's CHA2DS2-VASc score of 1 did not start anticoagulation, however did obtain ambulatory cardiac telemetry in order to evaluate atrial fibrillation burden, also started metoprolol titrate 50 mg twice daily to improve rate control.  Patient now presents for follow-up as ambulatory cardiac telemetry revealed patient has remained in atrial fibrillation for the duration of the monitoring 2 weeks. Reviewed and discussed results of echocardiogram and monitor, details above. Patient remains in atrial fib. Discussed indications, risks, benefits of anticoagulation, patient verbalized understanding. Will start him on eliquis and set up for cardioversion in 3 weeks, will plan for addition 4 weeks of anticoagulation following cardioversion, then with low thromboembolic risk would consider stopping anticoagulation.   Suspect patient's symptoms are related to low blood pressure, will therefore reduce metoprolol back to 25 mg twice daily.   Reviewed will patient indication, risks, benefits of cardioversion. His questions were addressed and he verbalized understanding. Patient wishes to proceed with the procedure.   Follow up after cardioversion.   Patient was seen in collaboration with Dr. Einar Gip and he is in agreement with the plan.    Alethia Berthold,  PA-C 06/15/2021, 11:00 AM Office: [phone]

## 2021-06-12 NOTE — H&P (View-Only) (Signed)
Primary Physician/Referring:  [name], MD  Patient ID: Chad Lawrence, male    DOB: 03-Dec-1950, 70 y.o.   MRN: [id number]  Chief Complaint  Patient presents with   Atrial Fibrillation   Follow-up   Results   HPI:    Chad Lawrence  is a 70 y.o. 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 Servando Snare. He did have persistant A. Fib post operatively and converted to sinus 2-3 weeks later and has maintained sinus since. He has no h/o hypertension, DM. He has Mild hyperlipidemia presently on statin.  Patient was last seen in our office 05/01/2021 for urgent visit with complaints of dizziness, at this time EKG revealed atrial fibrillation.  Given patient's CHA2DS2-VASc score of 1 did not start anticoagulation, however did obtain ambulatory cardiac telemetry in order to evaluate atrial fibrillation burden, also started metoprolol titrate 50 mg twice daily to improve rate control.  Patient now presents for follow-up as ambulatory cardiac telemetry revealed patient has remained in atrial fibrillation for the duration of the monitoring 2 weeks.   Patient's primary concern today is increase episodes of brief lightheadedness when standing and mildly worsened fatigue since increased metoprolol dose. Home BP log revealed episodes of systolic BP < 379 mmHg. He has also had some dyspnea on exertion. Denies chest pain, palpitations, syncope, near-syncope.   Past Medical History:  Diagnosis Date   Aortic regurgitation    Ascending aorta dilatation (HCC)    Atrial fibrillation ([place])    Hyperlipidemia    Pneumonia    Past Surgical History:  Procedure Laterality Date   AORTIC VALVE REPLACEMENT     BENTALL PROCEDURE N/A 09/20/2015    Procedure: BENTALL PROCEDURE;  Surgeon: [name], MD;  Location: [facility];  Service: Open Heart Surgery;  Laterality: N/A;   CARDIAC CATHETERIZATION N/A 09/12/2015   Procedure: Left Heart Cath and Coronary Angiography;  Surgeon: Adrian Prows, MD;  Location: [facility];  Service: Cardiovascular;  Laterality: N/A;   CLIPPING OF ATRIAL APPENDAGE N/A 09/20/2015   Procedure: CLIPPING OF ATRIAL APPENDAGE;  Surgeon: [name], MD;  Location: [facility];  Service: Open Heart Surgery;  Laterality: N/A;   COLONOSCOPY     x 2   HERNIA REPAIR     MAZE N/A 09/20/2015   Procedure: MAZE;  Surgeon: [name], MD;  Location: [facility];  Service: Open Heart Surgery;  Laterality: N/A;   TEE WITHOUT CARDIOVERSION N/A 09/20/2015   Procedure: TRANSESOPHAGEAL ECHOCARDIOGRAM (TEE);  Surgeon: [name], MD;  Location: [facility];  Service: Open Heart Surgery;  Laterality: N/A;   Family History  Problem Relation Age of Onset   Hypertension Mother    Breast cancer Mother    Atrial fibrillation Mother    Emphysema Father    Aneurysm Father        abdominal aortic aneurysm   COPD Father     Social History   Tobacco Use   Smoking status: Never   Smokeless tobacco: Never  Substance Use Topics   Alcohol use: Yes    Alcohol/week: 0.0 standard drinks    Comment: occasional   Marital Status: Married  ROS  Review of Systems  Constitutional: Positive for malaise/fatigue.  Cardiovascular:  Positive for dyspnea on exertion. Negative for chest pain,  claudication, leg swelling, near-syncope, palpitations and syncope.  Hematologic/Lymphatic: Does not bruise/bleed easily.  Gastrointestinal:  Negative for change in bowel habit, hematochezia and melena.  Neurological:  Positive for light-headedness. Negative for headaches.  All other systems reviewed and are negative. Objective   Vitals with BMI 06/13/2021 05/01/2021 08/21/2020  Height $Remov'6\' 0"'atYvkG$  $Remove'6\' 0"'lYdTVjd$  $RemoveB'6\' 0"'ZvPWPJQH$   Weight 193 lbs 185 lbs 191 lbs  BMI 26.17  53.61 44.3  Systolic 154 008 676  Diastolic 78 86 76  Pulse 70 66 73    Blood pressure 136/78, pulse 70, temperature 98 F (36.7 C), height 6' (1.829 m), weight 193 lb (87.5 kg), SpO2 96 %. Body mass index is 26.18 kg/m.  Physical Exam Vitals reviewed.  Constitutional:      General: He is not in acute distress.    Appearance: He is well-developed.  Neck:     Thyroid: No thyromegaly.     Vascular: No JVD.  Cardiovascular:     Rate and Rhythm: Normal rate. Rhythm irregular.     Pulses: Intact distal pulses.          Carotid pulses are 2+ on the right side and 2+ on the left side with bruit.      Radial pulses are 2+ on the right side and 2+ on the left side.       Popliteal pulses are 2+ on the right side and 2+ on the left side.       Dorsalis pedis pulses are 1+ on the right side and 1+ on the left side.       Posterior tibial pulses are 2+ on the right side and 2+ on the left side.     Heart sounds: S1 normal and S2 normal. Murmur heard.  Scratchy early systolic murmur is present with a grade of 2/6 at the upper right sternal border radiating to the neck.    No gallop.     Comments: No JVD. No leg edema.  Pulmonary:     Effort: Pulmonary effort is normal.     Breath sounds: Normal breath sounds. No wheezing, rhonchi or rales.  Neurological:     Mental Status: He is alert.   Laboratory examination:   CMP Latest Ref Rng & Units 09/26/2015 09/25/2015 09/24/2015  Glucose 65 - 99 mg/dL - 114(H) 108(H)  BUN 6 - 20 mg/dL - 19 23(H)  Creatinine 0.61 - 1.24 mg/dL - 1.10 1.10  Sodium 135 - 145 mmol/L - 135 133(L)  Potassium 3.5 - 5.1 mmol/L - 3.9 4.0  Chloride 101 - 111 mmol/L - 97(L) 98(L)  CO2 22 - 32 mmol/L - 28 26  Calcium 8.9 - 10.3 mg/dL - 8.5(L) 8.4(L)  Total Protein 6.5 - 8.1 g/dL 5.7(L) - 5.6(L)  Total Bilirubin 0.3 - 1.2 mg/dL 1.3(H) - 0.9  Alkaline Phos 38 - 126 U/L 84 - 65  AST 15 - 41 U/L 89(H) - 215(H)  ALT 17 - 63 U/L 307(H) - 388(H)   CBC Latest Ref Rng & Units  09/25/2015 09/24/2015 09/23/2015  WBC 4.0 - 10.5 K/uL 11.0(H) 13.1(H) 14.9(H)  Hemoglobin 13.0 - 17.0 g/dL 10.3(L) 10.3(L) 10.7(L)  Hematocrit 39.0 - 52.0 % 31.5(L) 31.1(L) 32.0(L)  Platelets 150 - 400 K/uL 207 169 125(L)   Lipid Panel  No results found for: CHOL, TRIG, HDL, CHOLHDL, VLDL, LDLCALC, LDLDIRECT HEMOGLOBIN A1C Lab Results  Component Value Date   HGBA1C 5.8 (H) 09/18/2015   MPG 120 09/18/2015   TSH No results for input(s): TSH  in the last 8760 hours.  External labs:  04/07/2019: HB 15.2/HCT 44.7, platelets 200.  Potassium 4.8, BUN 14, creatinine 1.0, eGFR greater than 60 minutes, total cholesterol 118, triglycerides 80, HDL 51, LDL 67.  PSA normal.  Labs 10/13/2018: HB 15.4/HCT 46.0, platelets 263.  BUN 12, creatinine 1.05, eGFR greater than 60 him a, CMP otherwise normal.  Potassium 4.7.  Total cholesterol 117, triglycerides 102, HDL 45, LDL 52.  Allergies  No Known Allergies   Medications Prior to Visit:   Outpatient Medications Prior to Visit  Medication Sig Dispense Refill   aspirin EC 81 MG tablet Take 81 mg by mouth every morning.      clotrimazole-betamethasone (LOTRISONE) cream SMARTSIG:0.5 Inch(es) Topical Twice Daily     rosuvastatin (CRESTOR) 10 MG tablet TAKE 1 TABLET DAILY 90 tablet 3   metoprolol tartrate (LOPRESSOR) 50 MG tablet Take 1 tablet (50 mg total) by mouth 2 (two) times daily. 60 tablet 3   No facility-administered medications prior to visit.   Final Medications at End of Visit    Current Meds  Medication Sig   apixaban (ELIQUIS) 5 MG TABS tablet Take 1 tablet (5 mg total) by mouth 2 (two) times daily.   aspirin EC 81 MG tablet Take 81 mg by mouth every morning.    clotrimazole-betamethasone (LOTRISONE) cream SMARTSIG:0.5 Inch(es) Topical Twice Daily   rosuvastatin (CRESTOR) 10 MG tablet TAKE 1 TABLET DAILY   [DISCONTINUED] metoprolol tartrate (LOPRESSOR) 50 MG tablet Take 1 tablet (50 mg total) by mouth 2 (two) times daily.   Radiology:     No results found.  Cardiac Studies:   Bentall Procedure 09/20/15: ascending aortic aneurysm repair with 30 mm Gelweave Valsalva graft and [employer] pericardial tissue valve 25 mm model 3300 TFX  CT angiogram chest 12/05/2016: Expected postoperative appearance after Bentall procedure with replacement of aortic valve, aortic root and descending thoracic aorta. 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  Carotid artery duplex  09/06/2019: Minimal soft plaque noted in bilateral carotid arteries without hemodynamically significant stenosis. Right vertebral artery flow is not visualized. Antegrade left vertebral artery flow. Follow up studies when clinically indicated.  CT angiogram chest 09/20/2019:  Redemonstration of surgical changes of Bentall procedure and left atrial clipping Native coronary artery atherosclerosis. Aortic Atherosclerosis (ICD10-I70.0). Centrilobular emphysema.  Emphysema (ICD10-J43.9). 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  PCV ECHOCARDIOGRAM COMPLETE 97/74/1423 Normal LV systolic function with visual EF 55-60%. Left ventricle cavity is normal in size. Abnormal septal wall motion due to post-operative valve. Mild left ventricular hypertrophy. Normal global wall motion. Unable to evaluate diastolic function due to atrial fibrillation. Normal LAP. S/p [facility] pericardial tissue valve 25 mm with normal functioning. No valvular or paravalvular regurgitation. Mild (Grade I) mitral regurgitation. Mild pulmonic regurgitation. Compared to study dated 09/18/2020: no significant change.  Mobile cardiac telemetry 14 days 6/14 - 05/15/2021: Dominant rhythm: Atrial fibrillation A. fib present: 100% Heart rate 40-187 bpm, average heart rate 79 bpm 0 episodes, 0% isolated SVE 5 episodes of VT, fastest 187 bpm for 6 beats, longest  for 7 beats at 126 bpm 1.9% isolated VE, less than 1 couplet/triplet 100% A. fib burden seen. 1 pause in A. fib with R-R duration of 3 seconds noted.  Sinus pause greater than 3 seconds noted at 12:36 PM no associated symptoms No atrial flutter, SVT, high-grade AV block seen. 1  patient triggered event correlated with A. fib 80-90 bpm.  EKG  06/13/2021: Atrial fibrillation with controlled ventricular response at a rate of 79 bpm.  Normal axis.  No evidence of ischemia or underlying injury pattern  05/04/2021: Atrial fibrillation with rapid ventricular response at a rate of 100 bpm.  Normal axis.  No evidence of ischemia or underlying injury pattern.  08/21/2020: Normal sinus rhythm at a rate of 70 bpm, left atrial enlargement.  Normal axis.  No evidence of ischemia.  Borderline low voltage complexes.  Compared to EKG 08/19/2018, no significant change.   Assessment     ICD-10-CM   1. Persistent atrial fibrillation (HCC)  I48.19 EKG 12-Lead    apixaban (ELIQUIS) 5 MG TABS tablet    2. S/P ascending aortic replacement  Z95.828     3. S/P AVR  Z95.2       Meds ordered this encounter  Medications   DISCONTD: metoprolol tartrate (LOPRESSOR) 50 MG tablet    Sig: Take 0.5 tablets (25 mg total) by mouth 2 (two) times daily.    Dispense:  60 tablet    Refill:  3   metoprolol tartrate (LOPRESSOR) 25 MG tablet    Sig: Take 1 tablet (25 mg total) by mouth 2 (two) times daily.    Dispense:  180 tablet    Refill:  3   apixaban (ELIQUIS) 5 MG TABS tablet    Sig: Take 1 tablet (5 mg total) by mouth 2 (two) times daily.    Dispense:  60 tablet    Refill:  1   Medications Discontinued During This Encounter  Medication Reason   metoprolol tartrate (LOPRESSOR) 50 MG tablet    metoprolol tartrate (LOPRESSOR) 50 MG tablet Reorder   This patients CHA2DS2-VASc Score 1 (age) and yearly risk of stroke 0.6%.   Recommendations:   DERRIN CURREY  is a 70 y.o. caucasian male with history of mild  hyperlipidemia,  thoracic aortic aneurysm  underwent Bentall procedure with replacement of aortic valve, aortic regurgitation, LAA ligation and modified maze procedure and reimplantation of his coronary arteries on 09/20/2015 by Dr. Servando Snare. History of atrial fibrillation post-op in 2016, has maintained sinus rhythm since. No history of hypertension or diabetes mellitus.   Patient was last seen in our office 05/01/2021 for urgent visit with complaints of dizziness, at this time EKG revealed atrial fibrillation.  Given patient's CHA2DS2-VASc score of 1 did not start anticoagulation, however did obtain ambulatory cardiac telemetry in order to evaluate atrial fibrillation burden, also started metoprolol titrate 50 mg twice daily to improve rate control.  Patient now presents for follow-up as ambulatory cardiac telemetry revealed patient has remained in atrial fibrillation for the duration of the monitoring 2 weeks. Reviewed and discussed results of echocardiogram and monitor, details above. Patient remains in atrial fib. Discussed indications, risks, benefits of anticoagulation, patient verbalized understanding. Will start him on eliquis and set up for cardioversion in 3 weeks, will plan for addition 4 weeks of anticoagulation following cardioversion, then with low thromboembolic risk would consider stopping anticoagulation.   Suspect patient's symptoms are related to low blood pressure, will therefore reduce metoprolol back to 25 mg twice daily.   Reviewed will patient indication, risks, benefits of cardioversion. His questions were addressed and he verbalized understanding. Patient wishes to proceed with the procedure.   Follow up after cardioversion.   Patient was seen in collaboration with Dr. Einar Gip and he is in agreement with the plan.    Alethia Berthold,  PA-C 06/15/2021, 11:00 AM Office: [phone]

## 2021-06-13 ENCOUNTER — Other Ambulatory Visit: Payer: Self-pay

## 2021-06-13 ENCOUNTER — Encounter: Payer: Self-pay | Admitting: Student

## 2021-06-13 ENCOUNTER — Ambulatory Visit: Payer: Medicare Other | Admitting: Student

## 2021-06-13 VITALS — BP 136/78 | HR 70 | Temp 98.0°F | Ht 72.0 in | Wt 193.0 lb

## 2021-06-13 DIAGNOSIS — Z952 Presence of prosthetic heart valve: Secondary | ICD-10-CM

## 2021-06-13 DIAGNOSIS — Z95828 Presence of other vascular implants and grafts: Secondary | ICD-10-CM

## 2021-06-13 DIAGNOSIS — I4819 Other persistent atrial fibrillation: Secondary | ICD-10-CM

## 2021-06-13 MED ORDER — METOPROLOL TARTRATE 25 MG PO TABS: 25.0000 mg | ORAL_TABLET | Freq: Two times a day (BID) | ORAL | 3 refills | Status: DC

## 2021-06-13 MED ORDER — METOPROLOL TARTRATE 50 MG PO TABS
25.0000 mg | ORAL_TABLET | Freq: Two times a day (BID) | ORAL | 3 refills | Status: DC
Start: 2021-06-13 — End: 2021-06-13

## 2021-06-15 MED ORDER — APIXABAN 5 MG PO TABS: 5.0000 mg | ORAL_TABLET | Freq: Two times a day (BID) | ORAL | 1 refills | Status: DC

## 2021-06-25 ENCOUNTER — Telehealth: Payer: Self-pay | Admitting: Cardiology

## 2021-06-25 NOTE — Telephone Encounter (Signed)
Pt aware.

## 2021-06-25 NOTE — Telephone Encounter (Signed)
Pt wanted to know if there was a specific time frame he should complete his blood work; Pt is having cardioversion done - how soon before or after should he get blood work done?

## 2021-06-28 LAB — BASIC METABOLIC PANEL
BUN/Creatinine Ratio: 11 (ref 10–24)
BUN: 13 mg/dL (ref 8–27)
CO2: 23 mmol/L (ref 20–29)
Calcium: 9.5 mg/dL (ref 8.6–10.2)
Chloride: 105 mmol/L (ref 96–106)
Creatinine, Ser: 1.16 mg/dL (ref 0.76–1.27)
Glucose: 118 mg/dL — ABNORMAL HIGH (ref 65–99)
Potassium: 5.1 mmol/L (ref 3.5–5.2)
Sodium: 143 mmol/L (ref 134–144)
eGFR: 67 mL/min/{1.73_m2} (ref >59)

## 2021-06-28 LAB — CBC
Hematocrit: 48.8 % (ref 37.5–51.0)
Hemoglobin: 16.4 g/dL (ref 13.0–17.7)
MCH: 31 pg (ref 26.6–33.0)
MCHC: 33.6 g/dL (ref 31.5–35.7)
MCV: 92 fL (ref 79–97)
Platelets: 237 10*3/uL (ref 150–450)
RBC: 5.29 x10E6/uL (ref 4.14–5.80)
RDW: 12.2 % (ref 11.6–15.4)
WBC: 5.1 10*3/uL (ref 3.4–10.8)

## 2021-07-02 DIAGNOSIS — I4891 Unspecified atrial fibrillation: Secondary | ICD-10-CM | POA: Diagnosis present

## 2021-07-03 ENCOUNTER — Encounter (HOSPITAL_COMMUNITY)
Admission: RE | Disposition: A | Discharge status: Home / Self Care | Payer: Self-pay | Source: Home / Self Care | Attending: Cardiology

## 2021-07-03 ENCOUNTER — Ambulatory Visit (HOSPITAL_COMMUNITY)
Admission: RE | Admit: 2021-07-03 | Discharge: 2021-07-03 | Disposition: A | Discharge status: Home / Self Care | Payer: Medicare Other | Attending: Cardiology | Admitting: Cardiology

## 2021-07-03 ENCOUNTER — Ambulatory Visit (HOSPITAL_COMMUNITY): Payer: Medicare Other | Admitting: Anesthesiology

## 2021-07-03 ENCOUNTER — Other Ambulatory Visit: Payer: Self-pay

## 2021-07-03 ENCOUNTER — Encounter (HOSPITAL_COMMUNITY): Payer: Self-pay | Admitting: Cardiology

## 2021-07-03 DIAGNOSIS — Z952 Presence of prosthetic heart valve: Secondary | ICD-10-CM | POA: Insufficient documentation

## 2021-07-03 DIAGNOSIS — I712 Thoracic aortic aneurysm, without rupture: Secondary | ICD-10-CM | POA: Insufficient documentation

## 2021-07-03 DIAGNOSIS — E785 Hyperlipidemia, unspecified: Secondary | ICD-10-CM | POA: Insufficient documentation

## 2021-07-03 DIAGNOSIS — Z7982 Long term (current) use of aspirin: Secondary | ICD-10-CM | POA: Diagnosis not present

## 2021-07-03 DIAGNOSIS — Z7901 Long term (current) use of anticoagulants: Secondary | ICD-10-CM | POA: Insufficient documentation

## 2021-07-03 DIAGNOSIS — I4819 Other persistent atrial fibrillation: Secondary | ICD-10-CM | POA: Diagnosis present

## 2021-07-03 DIAGNOSIS — Z79899 Other long term (current) drug therapy: Secondary | ICD-10-CM | POA: Insufficient documentation

## 2021-07-03 DIAGNOSIS — Z95828 Presence of other vascular implants and grafts: Secondary | ICD-10-CM | POA: Diagnosis not present

## 2021-07-03 DIAGNOSIS — I4891 Unspecified atrial fibrillation: Secondary | ICD-10-CM | POA: Diagnosis present

## 2021-07-03 HISTORY — PX: CARDIOVERSION: SHX1299

## 2021-07-03 SURGERY — CARDIOVERSION
Anesthesia: General

## 2021-07-03 MED ORDER — LIDOCAINE 2% (20 MG/ML) 5 ML SYRINGE
INTRAMUSCULAR | Status: DC | PRN
  Administered 2021-07-03: 60 mg via INTRAVENOUS

## 2021-07-03 MED ORDER — SODIUM CHLORIDE 0.9 % IV SOLN: INTRAVENOUS | Status: DC | PRN

## 2021-07-03 MED ORDER — PROPOFOL 10 MG/ML IV BOLUS
INTRAVENOUS | Status: DC | PRN
  Administered 2021-07-03: 60 mg via INTRAVENOUS

## 2021-07-03 NOTE — Anesthesia Procedure Notes (Signed)
Procedure Name: General with mask airway Date/Time: 07/03/2021 3:00 PM Performed by: Dorthea Cove, CRNA Pre-anesthesia Checklist: Timeout performed, Patient being monitored, Suction available, Emergency Drugs available and Patient identified Patient Re-evaluated:Patient Re-evaluated prior to induction Oxygen Delivery Method: Ambu bag Preoxygenation: Pre-oxygenation with 100% oxygen Induction Type: IV induction Ventilation: Mask ventilation without difficulty Placement Confirmation: positive ETCO2 and CO2 detector Dental Injury: Teeth and Oropharynx as per pre-operative assessment

## 2021-07-03 NOTE — Op Note (Signed)
Direct current cardioversion:  Indications:  Atrial Fibrillation  Procedure Details:  Consent: Risks of procedure as well as the alternatives and risks of each were explained to the (patient/caregiver).  Consent for procedure obtained.  Time Out: Verified patient identification, verified procedure, site/side was marked, verified correct patient position, special equipment/implants available, medications/allergies/relevent history reviewed, required imaging and test results available. PERFORMED.  Patient placed on cardiac monitor, pulse oximetry, supplemental oxygen as necessary.  Sedation given:  see anesthesia records.  Pacer pads placed anterior and posterior chest.  Cardioverted 1 time(s).  Cardioversion with synchronized biphasic 200J shock.  Evaluation: Findings: Post procedure EKG shows:  sinus bradycardia Complications: None Patient did tolerate procedure well.  Wife Chad Lawrence was update as well.  Continue Eliquis for 4 weeks and follow up with Dr. Einar Lawrence.   Chad Lawrence [facility]  Pager: [phone] Office: [phone] 07/03/2021, 3:21 PM

## 2021-07-03 NOTE — Interval H&P Note (Signed)
History and Physical Interval Note:  07/03/2021 3:01 PM  Chad Lawrence  has presented today for surgery, with the diagnosis of AFIB.  The various methods of treatment have been discussed with the patient and family. After consideration of risks, benefits and other options for treatment, the patient has consented to  Procedure(s): CARDIOVERSION (N/A) as a surgical intervention.  The patient's history has been reviewed, patient examined, no change in status, stable for surgery.  I have reviewed the patient's chart and labs.  Questions were answered to the patient's satisfaction.     Rex Kras, [place], [facility]  Pager: [phone] Office: [phone]

## 2021-07-03 NOTE — Anesthesia Preprocedure Evaluation (Signed)
Anesthesia Evaluation  Patient identified by MRN, date of birth, ID band Patient awake    Reviewed: Allergy & Precautions, NPO status , Patient's Chart, lab work & pertinent test results  Airway Mallampati: II  TM Distance: >3 FB Neck ROM: Full    Dental no notable dental hx.    Pulmonary neg pulmonary ROS,    Pulmonary exam normal breath sounds clear to auscultation       Cardiovascular + dysrhythmias Atrial Fibrillation  Rhythm:Irregular Rate:Normal  S/P AVR   Neuro/Psych negative neurological ROS  negative psych ROS   GI/Hepatic negative GI ROS, Neg liver ROS,   Endo/Other  negative endocrine ROS  Renal/GU negative Renal ROS  negative genitourinary   Musculoskeletal negative musculoskeletal ROS (+)   Abdominal   Peds negative pediatric ROS (+)  Hematology negative hematology ROS (+)   Anesthesia Other Findings   Reproductive/Obstetrics negative OB ROS                             Anesthesia Physical Anesthesia Plan  ASA: 3  Anesthesia Plan: General   Post-op Pain Management:    Induction: Intravenous  PONV Risk Score and Plan: 2 and Ondansetron, Dexamethasone and Treatment may vary due to age or medical condition  Airway Management Planned: Mask  Additional Equipment:   Intra-op Plan:   Post-operative Plan: Extubation in OR  Informed Consent: I have reviewed the patients History and Physical, chart, labs and discussed the procedure including the risks, benefits and alternatives for the proposed anesthesia with the patient or authorized representative who has indicated his/her understanding and acceptance.     Dental advisory given  Plan Discussed with: CRNA and Surgeon  Anesthesia Plan Comments:         Anesthesia Quick Evaluation

## 2021-07-03 NOTE — Transfer of Care (Signed)
Immediate Anesthesia Transfer of Care Note  Patient: Chad Lawrence  Procedure(s) Performed: CARDIOVERSION  Patient Location: Endoscopy Unit  Anesthesia Type:General  Level of Consciousness: awake, alert  and oriented  Airway & Oxygen Therapy: Patient Spontanous Breathing  Post-op Assessment: Report given to RN and Post -op Vital signs reviewed and stable  Post vital signs: Reviewed and stable  Last Vitals:  Vitals Value Taken Time  BP 130/82   Temp    Pulse 57   Resp 12   SpO2 98     Last Pain:  Vitals:   07/03/21 1154  TempSrc: Temporal         Complications: No notable events documented.

## 2021-07-03 NOTE — Anesthesia Postprocedure Evaluation (Signed)
Anesthesia Post Note  Patient: Chad Lawrence  Procedure(s) Performed: CARDIOVERSION     Patient location during evaluation: Endoscopy Anesthesia Type: General Level of consciousness: awake and alert Pain management: pain level controlled Vital Signs Assessment: post-procedure vital signs reviewed and stable Respiratory status: spontaneous breathing, nonlabored ventilation, respiratory function stable and patient connected to nasal cannula oxygen Cardiovascular status: blood pressure returned to baseline and stable Postop Assessment: no apparent nausea or vomiting Anesthetic complications: no   No notable events documented.  Last Vitals:  Vitals:   07/03/21 1530 07/03/21 1533  BP: 138/80   Pulse: (!) 55 (!) 58  Resp: 17 16  Temp:    SpO2: 99% 100%    Last Pain:  Vitals:   07/03/21 1533  TempSrc:   PainSc: 0-No pain                 Catalina Gravel

## 2021-07-04 ENCOUNTER — Encounter (HOSPITAL_COMMUNITY): Payer: Self-pay | Admitting: Cardiology

## 2021-07-16 ENCOUNTER — Other Ambulatory Visit: Payer: Self-pay | Admitting: Student

## 2021-07-16 DIAGNOSIS — I4819 Other persistent atrial fibrillation: Secondary | ICD-10-CM

## 2021-07-31 ENCOUNTER — Encounter: Payer: Self-pay | Admitting: Cardiology

## 2021-07-31 ENCOUNTER — Ambulatory Visit: Payer: Medicare Other | Admitting: Cardiology

## 2021-07-31 ENCOUNTER — Other Ambulatory Visit: Payer: Self-pay

## 2021-07-31 VITALS — BP 118/69 | HR 70 | Temp 98.7°F | Resp 17 | Ht 72.0 in | Wt 191.0 lb

## 2021-07-31 DIAGNOSIS — Z952 Presence of prosthetic heart valve: Secondary | ICD-10-CM

## 2021-07-31 DIAGNOSIS — Z95828 Presence of other vascular implants and grafts: Secondary | ICD-10-CM

## 2021-07-31 DIAGNOSIS — I480 Paroxysmal atrial fibrillation: Secondary | ICD-10-CM

## 2021-07-31 NOTE — Progress Notes (Signed)
Primary Physician/Referring:  [name], MD  Patient ID: Chad Lawrence, male    DOB: 1951/10/09, 70 y.o.   MRN: [id number]  Chief Complaint  Patient presents with   Atrial Fibrillation   Follow-up    1 month   HPI:    Chad Lawrence  is a 70 y.o. 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 Servando Snare. He did have persistant A. Fib post operatively and converted to sinus 2-3 weeks later and has maintained sinus since. He has no h/o hypertension, DM. He has Mild hyperlipidemia presently on statin.  Patient underwent direct-current cardioversion on 07/03/2020 and now presents for follow-up.  Since cardioversion he has noticed no further dizziness.  He remains asymptomatic.  Past Medical History:  Diagnosis Date   Aortic regurgitation    Ascending aorta dilatation (HCC)    Atrial fibrillation ([place])    Hyperlipidemia    Pneumonia    Past Surgical History:  Procedure Laterality Date   AORTIC VALVE REPLACEMENT     BENTALL PROCEDURE N/A 09/20/2015   Procedure: BENTALL PROCEDURE;  Surgeon: [name], MD;  Location: [place];  Service: Open Heart Surgery;  Laterality: N/A;   CARDIAC CATHETERIZATION N/A 09/12/2015   Procedure: Left Heart Cath and Coronary Angiography;  Surgeon: Adrian Prows, MD;  Location: [place] [facility];  Service: Cardiovascular;  Laterality: N/A;   CARDIOVERSION N/A 07/03/2021   Procedure: CARDIOVERSION;  Surgeon: Rex Kras, DO;  Location: [facility];  Service: Cardiovascular;  Laterality: N/A;   CLIPPING OF ATRIAL APPENDAGE N/A 09/20/2015   Procedure: CLIPPING OF ATRIAL APPENDAGE;  Surgeon: [name], MD;  Location: [place];  Service: Open Heart Surgery;  Laterality: N/A;    COLONOSCOPY     x 2   HERNIA REPAIR     MAZE N/A 09/20/2015   Procedure: MAZE;  Surgeon: [name], MD;  Location: [place];  Service: Open Heart Surgery;  Laterality: N/A;   TEE WITHOUT CARDIOVERSION N/A 09/20/2015   Procedure: TRANSESOPHAGEAL ECHOCARDIOGRAM (TEE);  Surgeon: [name], MD;  Location: [place];  Service: Open Heart Surgery;  Laterality: N/A;   Family History  Problem Relation Age of Onset   Hypertension Mother    Breast cancer Mother    Atrial fibrillation Mother    Emphysema Father    Aneurysm Father        abdominal aortic aneurysm   COPD Father     Social History   Tobacco Use   Smoking status: Never   Smokeless tobacco: Never  Substance Use Topics   Alcohol use: Yes    Alcohol/week: 0.0 standard drinks    Comment: occasional   Marital Status: Married  ROS  Review of Systems  Constitutional: Negative for malaise/fatigue.  Cardiovascular:  Negative for chest pain, claudication, dyspnea on exertion, leg swelling, near-syncope, palpitations and syncope.  Hematologic/Lymphatic: Does not bruise/bleed easily.  Gastrointestinal:  Negative for change in bowel habit, hematochezia and melena.  Neurological:  Negative for headaches and light-headedness.  All other systems reviewed and are negative. Objective   Vitals with BMI 07/31/2021 07/03/2021 07/03/2021  Height _0  - -  Weight 191 lbs - -  BMI 10.9 - -  Systolic 323 - 557  Diastolic 69 - 80  Pulse 70 58 55  Blood pressure 118/69, pulse 70, temperature 98.7 F (37.1 C), temperature source Temporal, resp. rate 17, height 6' (1.829 m), weight 191 lb (86.6 kg), SpO2 96 %. Body mass index is 25.9 kg/m.  Physical Exam Vitals reviewed.  Constitutional:      General: He is not in acute distress.    Appearance: Normal appearance. He is well-developed.  Neck:     Thyroid: No thyromegaly.     Vascular: No JVD.  Cardiovascular:     Rate and Rhythm: Normal rate and regular rhythm.     Pulses:  Intact distal pulses.          Carotid pulses are 2+ on the right side and 2+ on the left side with bruit.      Radial pulses are 2+ on the right side and 2+ on the left side.       Popliteal pulses are 2+ on the right side and 2+ on the left side.       Dorsalis pedis pulses are 1+ on the right side and 1+ on the left side.       Posterior tibial pulses are 2+ on the right side and 2+ on the left side.     Heart sounds: S1 normal and S2 normal. Murmur heard.  Scratchy early systolic murmur is present with a grade of 2/6 at the upper right sternal border radiating to the neck.    No gallop.     Comments: No JVD. No leg edema.  Pulmonary:     Effort: Pulmonary effort is normal.     Breath sounds: Normal breath sounds. No wheezing, rhonchi or rales.  Abdominal:     General: Abdomen is flat.     Palpations: Abdomen is soft.  Neurological:     General: No focal deficit present.     Mental Status: He is alert.   Laboratory examination:   CMP Latest Ref Rng & Units 06/27/2021 09/26/2015 09/25/2015  Glucose 65 - 99 mg/dL 118(H) - 114(H)  BUN 8 - 27 mg/dL 13 - 19  Creatinine 0.76 - 1.27 mg/dL 1.16 - 1.10  Sodium 134 - 144 mmol/L 143 - 135  Potassium 3.5 - 5.2 mmol/L 5.1 - 3.9  Chloride 96 - 106 mmol/L 105 - 97(L)  CO2 20 - 29 mmol/L 23 - 28  Calcium 8.6 - 10.2 mg/dL 9.5 - 8.5(L)  Total Protein 6.5 - 8.1 g/dL - 5.7(L) -  Total Bilirubin 0.3 - 1.2 mg/dL - 1.3(H) -  Alkaline Phos 38 - 126 U/L - 84 -  AST 15 - 41 U/L - 89(H) -  ALT 17 - 63 U/L - 307(H) -   CBC Latest Ref Rng & Units 06/27/2021 09/25/2015 09/24/2015  WBC 3.4 - 10.8 x10E3/uL 5.1 11.0(H) 13.1(H)  Hemoglobin 13.0 - 17.7 g/dL 16.4 10.3(L) 10.3(L)  Hematocrit 37.5 - 51.0 % 48.8 31.5(L) 31.1(L)  Platelets 150 - 450 x10E3/uL 237 207 169   Lipid Panel  No results found for: CHOL, TRIG, HDL, CHOLHDL, VLDL, LDLCALC, LDLDIRECT HEMOGLOBIN A1C Lab Results  Component Value Date   HGBA1C 5.8 (H) 09/18/2015   MPG 120 09/18/2015    TSH No results for input(s): TSH in the last 8760 hours.  External labs:  04/07/2019: HB 15.2/HCT 44.7, platelets 200.  Potassium 4.8, BUN 14, creatinine 1.0, eGFR greater than 60 minutes, total cholesterol 118, triglycerides 80, HDL 51, LDL 67.  PSA normal.  Labs 10/13/2018: HB 15.4/HCT 46.0, platelets 263.  BUN 12, creatinine 1.05, eGFR greater  than 60 him a, CMP otherwise normal.  Potassium 4.7.  Total cholesterol 117, triglycerides 102, HDL 45, LDL 52.  Allergies  No Known Allergies   Medications Prior to Visit:   Outpatient Medications Prior to Visit  Medication Sig Dispense Refill   metoprolol tartrate (LOPRESSOR) 25 MG tablet Take 1 tablet (25 mg total) by mouth 2 (two) times daily. 180 tablet 3   rosuvastatin (CRESTOR) 10 MG tablet TAKE 1 TABLET DAILY 90 tablet 3   aspirin EC 81 MG tablet Take 81 mg by mouth every morning.      ELIQUIS 5 MG TABS tablet TAKE 1 TABLET BY MOUTH 2 TIMES DAILY 180 tablet 1   fluticasone (CUTIVATE) 0.005 % ointment Apply 1 application topically daily.     nystatin ointment (MYCOSTATIN) Apply 1 application topically daily.     No facility-administered medications prior to visit.   Final Medications at End of Visit    Current Meds  Medication Sig   metoprolol tartrate (LOPRESSOR) 25 MG tablet Take 1 tablet (25 mg total) by mouth 2 (two) times daily.   rosuvastatin (CRESTOR) 10 MG tablet TAKE 1 TABLET DAILY   [DISCONTINUED] aspirin EC 81 MG tablet Take 81 mg by mouth every morning.    [DISCONTINUED] ELIQUIS 5 MG TABS tablet TAKE 1 TABLET BY MOUTH 2 TIMES DAILY   Radiology:    No results found.  Cardiac Studies:   Bentall Procedure 09/20/15: ascending aortic aneurysm repair with 30 mm Gelweave Valsalva graft and [employer] pericardial tissue valve 25 mm model 3300 TFX  CT angiogram chest 12/05/2016: Expected postoperative appearance after Bentall procedure with replacement of aortic valve, aortic root and descending thoracic aorta.  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  Carotid artery duplex  09/06/2019: Minimal soft plaque noted in bilateral carotid arteries without hemodynamically significant stenosis. Right vertebral artery flow is not visualized. Antegrade left vertebral artery flow. Follow up studies when clinically indicated.  CT angiogram chest 09/20/2019:  Redemonstration of surgical changes of Bentall procedure and left atrial clipping Native coronary artery atherosclerosis. Aortic Atherosclerosis (ICD10-I70.0). Centrilobular emphysema.  Emphysema (ICD10-J43.9). 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  PCV ECHOCARDIOGRAM COMPLETE 60/08/9322 Normal LV systolic function with visual EF 55-60%. Left ventricle cavity is normal in size. Abnormal septal wall motion due to post-operative valve. Mild left ventricular hypertrophy. Normal global wall motion. Unable to evaluate diastolic function due to atrial fibrillation. Normal LAP. S/p [facility] pericardial tissue valve 25 mm with normal functioning. No valvular or paravalvular regurgitation. Mild (Grade I) mitral regurgitation. Mild pulmonic regurgitation. Compared to study dated 09/18/2020: no significant change.  Mobile cardiac telemetry 14 days 6/14 - 05/15/2021: Dominant rhythm: Atrial fibrillation A. fib present: 100% Heart rate 40-187 bpm, average heart rate 79 bpm 0 episodes, 0% isolated SVE 5 episodes of VT, fastest 187 bpm for 6 beats, longest for 7 beats at 126 bpm 1.9% isolated VE, less than 1 couplet/triplet 100% A. fib burden seen. 1 pause in A. fib with R-R duration of 3 seconds noted.  Sinus pause greater than 3 seconds noted at 12:36 PM no associated symptoms No atrial flutter, SVT, high-grade AV block seen. 1 patient triggered event correlated with A. fib 80-90 bpm.  EKG  EKG 07/31/2021: Normal sinus  rhythm at rate of 60 bpm, normal axis, incomplete right bundle branch block.  Low-voltage complexes.  Normal QT interval.  06/13/2021: Atrial fibrillation with controlled ventricular response at a rate of 79 bpm.  Normal axis.  No evidence of ischemia or underlying injury pattern  Assessment     ICD-10-CM   1. Paroxysmal atrial fibrillation (HCC)  I48.0 EKG 12-Lead    2. S/P AVR  Z95.2     3. S/P ascending aortic replacement  Z95.828       No orders of the defined types were placed in this encounter.  Medications Discontinued During This Encounter  Medication Reason   nystatin ointment (MYCOSTATIN) Error   fluticasone (CUTIVATE) 0.005 % ointment Error   aspirin EC 81 MG tablet Discontinued by provider   ELIQUIS 5 MG TABS tablet Completed Course   This patients CHA2DS2-VASc Score 1 (age) and yearly risk of stroke 0.6%.   Recommendations:   Chad Lawrence  is a 70 y.o. caucasian male with history of mild hyperlipidemia,  thoracic aortic aneurysm  underwent Bentall procedure with replacement of aortic valve, aortic regurgitation, LAA ligation and modified maze procedure and reimplantation of his coronary arteries on 09/20/2015 by Dr. Servando Snare. History of atrial fibrillation post-op in 2016, has maintained sinus rhythm since. No history of hypertension or diabetes mellitus.   Patient underwent direct-current cardioversion on 07/03/2020 and now presents for follow-up.  Since cardioversion he has noticed no further dizziness.  He remains asymptomatic.  He is maintaining sinus rhythm.  We discussed monitoring of his heart rate closely, as his chads vasc score is very low, he has had left atrial appendage clipping, his cardioembolic risk is very low.  Hence I discontinued aspirin and also Eliquis, he will complete his Eliquis dose that he has at home for approximately for 2 weeks.  I also discussed with him regarding the symptoms associated with recurrence of atrial fibrillation including  fatigue, decreased exercise tolerance, palpitations.  Otherwise no change in his physical exam, I will see him back in 6 months for follow-up and if he remains stable I will see him back on an annual basis.  His BMI is 25.9, advised him to lose about 5 to 8 pounds in weight.  He is presently 70 years of age and continues to remain active.   Adrian Prows, MD, [facility] 07/31/2021, 2:34 PM Office: [phone] Fax: [phone] Pager: [phone]

## 2021-08-20 ENCOUNTER — Ambulatory Visit: Payer: Medicare Other | Admitting: Cardiology

## 2021-10-01 ENCOUNTER — Other Ambulatory Visit: Payer: Self-pay | Admitting: Cardiology

## 2021-10-02 ENCOUNTER — Other Ambulatory Visit: Payer: Self-pay

## 2021-10-02 MED ORDER — ROSUVASTATIN CALCIUM 10 MG PO TABS: 10.0000 mg | ORAL_TABLET | Freq: Every day | ORAL | 2 refills | Status: DC

## 2021-10-02 MED ORDER — ROSUVASTATIN CALCIUM 10 MG PO TABS: 10.0000 mg | ORAL_TABLET | Freq: Every day | ORAL | 0 refills | Status: DC

## 2021-10-04 ENCOUNTER — Other Ambulatory Visit: Payer: Self-pay | Admitting: Cardiology

## 2021-10-18 HISTORY — PX: CATARACT EXTRACTION: SUR2

## 2021-12-06 ENCOUNTER — Other Ambulatory Visit: Payer: Self-pay

## 2021-12-06 MED ORDER — METOPROLOL TARTRATE 25 MG PO TABS
25.0000 mg | ORAL_TABLET | Freq: Two times a day (BID) | ORAL | 3 refills | Status: DC
Start: 2021-12-06 — End: 2022-02-08

## 2021-12-06 MED ORDER — ROSUVASTATIN CALCIUM 10 MG PO TABS: ORAL_TABLET | ORAL | 0 refills | Status: DC

## 2021-12-14 NOTE — Progress Notes (Signed)
Primary Physician/Referring:  [name], MD  Patient ID: Chad Lawrence, male    DOB: 17-Feb-1951, 71 y.o.   MRN: [id number]  Chief Complaint  Patient presents with   Fatigue   Dizziness   Atrial Fibrillation   Follow-up   HPI:    Chad Lawrence  is a 71 y.o. 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 Servando Snare. He did have persistant A. Fib post operatively and converted to sinus 2-3 weeks later. Patient with recurrence of atrial fibrillation and underwent direct-current cardioversion on 07/03/2021, he has maintained sinus rhythm since.   Patient presents for 48-monthfollow up. At last office visit he was maintaining sinus rhythm and was asymptomatic. Given patient's low cardioembolic risk aspirin and Eliquis were discontinued at last office visit by Dr. GEinar Gip  Patient had been feeling well overall until about 2 weeks ago when he began to experience worsening fatigue as well as lightheadedness upon standing.  He has used no family members cardio mobile which noted him to be back in A. fib.  Patient has therefore been monitoring his heart rate reports maximum heart rate of 90 bpm on home monitoring.  He does have dyspnea on exertion with brisk walking.  Denies chest pain, syncope, near syncope.  Past Medical History:  Diagnosis Date   Aortic regurgitation    Ascending aorta dilatation (HCC)    [place]    Hyperlipidemia    Pneumonia    Past Surgical History:  Procedure Laterality Date   AORTIC VALVE REPLACEMENT     BENTALL PROCEDURE N/A 09/20/2015   Procedure: BENTALL PROCEDURE;  Surgeon: EGrace Isaac MD;  [facility]  Service: Open Heart Surgery;  Laterality: N/A;   CARDIAC  CATHETERIZATION N/A 09/12/2015   Procedure: Left Heart Cath and Coronary Angiography;  Surgeon: JAdrian Prows MD;  [facility];  Service: Cardiovascular;  Laterality: N/A;   CARDIOVERSION N/A 07/03/2021   Procedure: CARDIOVERSION;  Surgeon: TRex Kras DO;  [facility];  Service: Cardiovascular;  Laterality: N/A;   CLIPPING OF ATRIAL APPENDAGE N/A 09/20/2015   Procedure: CLIPPING OF ATRIAL APPENDAGE;  Surgeon: EGrace Isaac MD;  [place] [facility]  Service: Open Heart Surgery;  Laterality: N/A;   COLONOSCOPY     x 2   HERNIA REPAIR     MAZE N/A 09/20/2015   Procedure: MAZE;  Surgeon: EGrace Isaac MD;  [place] [facility]  Service: Open Heart Surgery;  Laterality: N/A;   TEE WITHOUT CARDIOVERSION N/A 09/20/2015   Procedure: TRANSESOPHAGEAL ECHOCARDIOGRAM (TEE);  Surgeon: EGrace Isaac MD;  [place] [facility]  Service: Open Heart Surgery;  Laterality: N/A;   Family History  Problem Relation Age of Onset   Hypertension Mother    Breast cancer Mother    Atrial fibrillation Mother    Emphysema Father    Aneurysm Father        abdominal aortic aneurysm   COPD Father    Social History   Tobacco Use   Smoking status: Never   Smokeless tobacco: Never  Substance Use Topics   Alcohol use: Yes    Alcohol/week: 0.0 standard drinks    Comment: occasional   Marital Status: Married  ROS  Review of Systems  Constitutional: Positive for malaise/fatigue.  Cardiovascular:  Negative for chest pain, claudication, dyspnea on exertion, leg swelling, near-syncope, palpitations and syncope.  Hematologic/Lymphatic: Does not bruise/bleed easily.  Gastrointestinal:  Negative for change in bowel habit, hematochezia and melena.  Neurological:  Positive for light-headedness. Negative for headaches.  All other systems reviewed and are negative.  Objective   Vitals with BMI 12/17/2021 07/31/2021 07/03/2021  Height '6\' 0"'  '6\' 0"'  -  Weight 199 lbs 191 lbs -  BMI 63.84 53.6 -   Systolic 468 032 -  Diastolic 82 69 -  Pulse 83 70 58    Blood pressure 135/82, pulse 83, temperature 97.7 F (36.5 C), temperature source Temporal, height 6' (1.829 m), weight 199 lb (90.3 kg), SpO2 100 %. Body mass index is 26.99 kg/m.  Physical Exam Vitals reviewed.  Constitutional:      General: He is not in acute distress.    Appearance: Normal appearance. He is well-developed.  Neck:     Thyroid: No thyromegaly.     Vascular: No JVD.  Cardiovascular:     Rate and Rhythm: Normal rate. Rhythm irregular.     Pulses: Intact distal pulses.          Carotid pulses are 2+ on the right side and 2+ on the left side with bruit.      Radial pulses are 2+ on the right side and 2+ on the left side.       Popliteal pulses are 2+ on the right side and 2+ on the left side.       Dorsalis pedis pulses are 1+ on the right side and 1+ on the left side.       Posterior tibial pulses are 2+ on the right side and 2+ on the left side.     Heart sounds: S1 normal and S2 normal. Murmur heard.  Scratchy early systolic murmur is present with a grade of 2/6 at the upper right sternal border radiating to the neck.    No gallop.     Comments: No JVD Pulmonary:     Effort: Pulmonary effort is normal.     Breath sounds: Normal breath sounds. No wheezing, rhonchi or rales.  Musculoskeletal:     Right lower leg: No edema.     Left lower leg: No edema.  Neurological:     General: No focal deficit present.     Mental Status: He is alert.    Laboratory examination:   CMP Latest Ref Rng & Units 06/27/2021 09/26/2015 09/25/2015  Glucose 65 - 99 mg/dL 118(H) - 114(H)  BUN 8 - 27 mg/dL 13 - 19  Creatinine 0.76 - 1.27 mg/dL 1.16 - 1.10  Sodium 134 - 144 mmol/L 143 - 135  Potassium 3.5 - 5.2 mmol/L 5.1 - 3.9  Chloride 96 - 106 mmol/L 105 - 97(L)  CO2 20 - 29 mmol/L 23 - 28  Calcium 8.6 - 10.2 mg/dL 9.5 - 8.5(L)  Total Protein 6.5 - 8.1 g/dL - 5.7(L) -  Total Bilirubin 0.3 - 1.2 mg/dL - 1.3(H) -   Alkaline Phos 38 - 126 U/L - 84 -  AST 15 - 41 U/L - 89(H) -  ALT 17 - 63 U/L - 307(H) -   CBC Latest Ref Rng & Units 06/27/2021 09/25/2015 09/24/2015  WBC 3.4 - 10.8 x10E3/uL 5.1 11.0(H) 13.1(H)  Hemoglobin 13.0 - 17.7 g/dL 16.4 10.3(L) 10.3(L)  Hematocrit 37.5 - 51.0 % 48.8 31.5(L) 31.1(L)  Platelets 150 - 450 x10E3/uL 237 207 169   Lipid Panel  No  results found for: CHOL, TRIG, HDL, CHOLHDL, VLDL, LDLCALC, LDLDIRECT HEMOGLOBIN A1C Lab Results  Component Value Date   HGBA1C 5.8 (H) 09/18/2015   MPG 120 09/18/2015   TSH No results for input(s): TSH in the last 8760 hours.  External labs:  10/29/2021: Total cholesterol 127, HDL 51, triglycerides 107, LDL 57 BUN 14, creatinine 0.98, GFR >60, sodium 139, potassium 4.9 TSH 2.90 Hgb 16, HCT 46.8, MCV 93.6, platelet 226  04/07/2019: HB 15.2/HCT 44.7, platelets 200.  Potassium 4.8, BUN 14, creatinine 1.0, eGFR greater than 60 minutes, total cholesterol 118, triglycerides 80, HDL 51, LDL 67.  PSA normal.  Labs 10/13/2018: HB 15.4/HCT 46.0, platelets 263.  BUN 12, creatinine 1.05, eGFR greater than 60 him a, CMP otherwise normal.  Potassium 4.7.  Total cholesterol 117, triglycerides 102, HDL 45, LDL 52.  Allergies  No Known Allergies   Medications Prior to Visit:   Outpatient Medications Prior to Visit  Medication Sig Dispense Refill   metoprolol tartrate (LOPRESSOR) 25 MG tablet Take 1 tablet (25 mg total) by mouth 2 (two) times daily. 180 tablet 3   rosuvastatin (CRESTOR) 10 MG tablet TAKE 1 TABLET(10 MG) BY MOUTH DAILY 90 tablet 0   ASPIRIN 81 PO Take 1 tablet by mouth daily.     No facility-administered medications prior to visit.   Final Medications at End of Visit    Current Meds  Medication Sig   apixaban (ELIQUIS) 5 MG TABS tablet Take 1 tablet (5 mg total) by mouth 2 (two) times daily.   dronedarone (MULTAQ) 400 MG tablet Take 1 tablet (400 mg total) by mouth 2 (two) times daily with a meal.   metoprolol tartrate  (LOPRESSOR) 25 MG tablet Take 1 tablet (25 mg total) by mouth 2 (two) times daily.   rosuvastatin (CRESTOR) 10 MG tablet TAKE 1 TABLET(10 MG) BY MOUTH DAILY   Radiology:    No results found.   Cardiac Studies:   Bentall Procedure 09/20/15: ascending aortic aneurysm repair with 30 mm Gelweave Valsalva graft and [employer] pericardial tissue valve 25 mm model 3300 TFX  CT angiogram chest 12/05/2016: Expected postoperative appearance after Bentall procedure with replacement of aortic valve, aortic root and descending thoracic aorta. 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  Carotid artery duplex  09/06/2019: Minimal soft plaque noted in bilateral carotid arteries without hemodynamically significant stenosis. Right vertebral artery flow is not visualized. Antegrade left vertebral artery flow. Follow up studies when clinically indicated.  CT angiogram chest 09/20/2019:  Redemonstration of surgical changes of Bentall procedure and left atrial clipping Native coronary artery atherosclerosis. Aortic Atherosclerosis (ICD10-I70.0). Centrilobular emphysema.  Emphysema (ICD10-J43.9). 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  PCV ECHOCARDIOGRAM COMPLETE 57/84/6962 Normal LV systolic function with visual EF 55-60%. Left ventricle cavity is normal in size. Abnormal septal wall motion due to post-operative valve. Mild left ventricular hypertrophy. Normal global wall motion. Unable to evaluate diastolic function due to atrial fibrillation. Normal LAP. S/p [facility] pericardial tissue valve 25 mm with normal functioning. No valvular or paravalvular regurgitation. Mild (Grade I) mitral regurgitation. Mild pulmonic regurgitation. Compared to study dated 09/18/2020: no significant change.  Mobile cardiac telemetry 14 days 6/14 - 05/15/2021: Dominant rhythm:  Atrial fibrillation A. fib present: 100% Heart rate 40-187 bpm, average heart rate 79 bpm 0 episodes, 0% isolated SVE 5 episodes of VT, fastest 187 bpm for 6  beats, longest for 7 beats at 126 bpm 1.9% isolated VE, less than 1 couplet/triplet 100% A. fib burden seen. 1 pause in A. fib with R-R duration of 3 seconds noted.  Sinus pause greater than 3 seconds noted at 12:36 PM no associated symptoms No atrial flutter, SVT, high-grade AV block seen. 1 patient triggered event correlated with A. fib 80-90 bpm.  Direct current cardioversion 07/03/2021 Indications:  Atrial Fibrillation Cardioverted 1 time(s).  Cardioversion with synchronized biphasic 200J shock. Post procedure EKG shows:  sinus bradycardia  EKG  12/17/2021: Atrial fibrillation with controlled ventricular response at a rate of 86 bpm.Left axis.  Incomplete right bundle branch block.  07/31/2021: Normal sinus rhythm at rate of 60 bpm, normal axis, incomplete right bundle branch block.  Low-voltage complexes.  Normal QT interval.   06/13/2021: Atrial fibrillation with controlled ventricular response at a rate of 79 bpm.  Normal axis.  No evidence of ischemia or underlying injury pattern  Assessment     ICD-10-CM   1. Paroxysmal atrial fibrillation (HCC)  I48.0 EKG 12-Lead    2. S/P AVR  Z95.2     3. S/P ascending aortic replacement  Z95.828     4. Dizziness  R42 EKG 12-Lead      Meds ordered this encounter  Medications   apixaban (ELIQUIS) 5 MG TABS tablet    Sig: Take 1 tablet (5 mg total) by mouth 2 (two) times daily.    Dispense:  60 tablet    Refill:  3   dronedarone (MULTAQ) 400 MG tablet    Sig: Take 1 tablet (400 mg total) by mouth 2 (two) times daily with a meal.    Dispense:  60 tablet    Refill:  3    Medications Discontinued During This Encounter  Medication Reason   ASPIRIN 81 PO    This patients CHA2DS2-VASc Score 2 (vasc, Age) and yearly risk of stroke 2.2%.   Recommendations:   Chad Lawrence  is a 71 y.o. 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 Servando Snare. He did have persistant A. Fib post operatively and converted to sinus 2-3 weeks later. Patient with recurrence of atrial fibrillation and underwent direct-current cardioversion on 07/03/2021, he has maintained sinus rhythm since.   Patient presents for 22-monthfollow up. At last office visit he was maintaining sinus rhythm and was asymptomatic. Given patient's low cardioembolic risk aspirin and Eliquis were discontinued at last office visit by Dr. GEinar Gip  However patient has now had recurrence of atrial fibrillation.  Therefore given CHA2DS2-VASc score of 2 shared decision was to resume Eliquis 5 mg p.o. twice daily.  As patient is symptomatic with atrial fibrillation discussed repeat direct-current cardioversion versus antiarrhythmic therapy, patient is open to either 1.  I therefore discussed patient extensively with Dr. GEinar Gipwho recommends initiation of Multaq.   Reviewed with patient indication as well as adverse effects associated with Multaq, he verbalized understanding and agrees to start Multaq.  We will therefore start Multaq 400 mg p.o. twice daily, will repeat EKG at next office visit.   I personally reviewed external labs, lipids are well controlled.  Follow-up in 3 weeks, sooner if needed for atrial fibrillation with repeat EKG   CAlethia Berthold PA-C 12/18/2021, 10:23 AM Office: [phone]

## 2021-12-17 ENCOUNTER — Encounter: Payer: Self-pay | Admitting: Student

## 2021-12-17 ENCOUNTER — Ambulatory Visit: Payer: Medicare Other | Admitting: Student

## 2021-12-17 ENCOUNTER — Other Ambulatory Visit: Payer: Self-pay

## 2021-12-17 VITALS — BP 135/82 | HR 83 | Temp 97.7°F | Ht 72.0 in | Wt 199.0 lb

## 2021-12-17 DIAGNOSIS — Z95828 Presence of other vascular implants and grafts: Secondary | ICD-10-CM

## 2021-12-17 DIAGNOSIS — Z952 Presence of prosthetic heart valve: Secondary | ICD-10-CM

## 2021-12-17 DIAGNOSIS — I480 Paroxysmal atrial fibrillation: Secondary | ICD-10-CM

## 2021-12-17 DIAGNOSIS — R42 Dizziness and giddiness: Secondary | ICD-10-CM

## 2021-12-17 MED ORDER — MULTAQ 400 MG PO TABS: 400.0000 mg | ORAL_TABLET | Freq: Two times a day (BID) | ORAL | 3 refills | Status: DC

## 2021-12-17 MED ORDER — APIXABAN 5 MG PO TABS: 5.0000 mg | ORAL_TABLET | Freq: Two times a day (BID) | ORAL | 3 refills | Status: DC

## 2021-12-18 ENCOUNTER — Encounter: Payer: Self-pay | Admitting: Student

## 2021-12-31 ENCOUNTER — Telehealth: Payer: Self-pay | Admitting: Cardiology

## 2021-12-31 ENCOUNTER — Telehealth: Payer: Self-pay

## 2021-12-31 MED ORDER — APIXABAN 5 MG PO TABS: 5.0000 mg | ORAL_TABLET | Freq: Two times a day (BID) | ORAL | 3 refills | Status: DC

## 2021-12-31 NOTE — Telephone Encounter (Signed)
Pt stated that after starting Multaq his HR went up to 106 and stayed there for about 40 minutes. It did eventually drop back to 70. A pharmacist told him this should not be happening and wanted him to contact you. He is unsure of what to do. Please advise.

## 2021-12-31 NOTE — Telephone Encounter (Signed)
error 

## 2022-01-01 MED ORDER — APIXABAN 5 MG PO TABS: 5.0000 mg | ORAL_TABLET | Freq: Two times a day (BID) | ORAL | 3 refills | Status: DC

## 2022-01-01 MED ORDER — MULTAQ 400 MG PO TABS: 400.0000 mg | ORAL_TABLET | Freq: Two times a day (BID) | ORAL | 3 refills | Status: DC

## 2022-01-01 NOTE — Telephone Encounter (Signed)
Patient has had no recurrence of elevated heart rate.  He will continue to monitor regularly.  Advised him to continue Multaq as prescribed.  He requests refill of Multaq as well as Eliquis be sent to [facility], I have done this.

## 2022-01-03 ENCOUNTER — Telehealth: Payer: Self-pay

## 2022-01-03 NOTE — Telephone Encounter (Signed)
106 and 102 are still not too fast in his clinical context. He may continue Multaq for now and we will discuss further at office visit Monday. Please advise patient to let us know if he develops symptoms.

## 2022-01-04 NOTE — Telephone Encounter (Signed)
Patient is aware 

## 2022-01-04 NOTE — Progress Notes (Signed)
Primary Physician/Referring:  [name], MD  Patient ID: Chad Lawrence, male    DOB: 1951-07-25, 71 y.o.   MRN: [id number]  Chief Complaint  Patient presents with   Atrial Fibrillation   Follow-up   HPI:    Chad Lawrence  is a 71 y.o. 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 Servando Snare. He did have persistant A. Fib post operatively and converted to sinus 2-3 weeks later. Patient with recurrence of atrial fibrillation and underwent direct-current cardioversion on 07/03/2021. He had recurrence of atrial fibrillation in 11/2021.   Patient was therefore started on Multaq at last office visit given recurrence of atrial fibrillation.  Patient subsequently called the office concerned regarding episodes of elevated heart rate since starting Multaq. He now presents for follow up.  Patient continues to have episodes of dyspnea on exertion and lightheadedness.  He is tolerating anticoagulation without bleeding diathesis, however he does admit to missing a single dose of Eliquis approximately 1 week ago.  Denies chest pain, syncope, near syncope.  He is able to ride his bike without issue lately.  Patient reports maximum heart rate of 107 bpm on home monitoring.  Past Medical History:  Diagnosis Date   Aortic regurgitation    Ascending aorta dilatation (HCC)    Atrial fibrillation ([place])    Hyperlipidemia    Pneumonia    Past Surgical History:  Procedure Laterality Date   AORTIC VALVE REPLACEMENT     BENTALL PROCEDURE N/A 09/20/2015   Procedure: BENTALL PROCEDURE;  Surgeon: [name], MD;  Location: [place];  Service: Open Heart Surgery;  Laterality: N/A;   CARDIAC CATHETERIZATION N/A 09/12/2015   Procedure: Left  Heart Cath and Coronary Angiography;  Surgeon: Adrian Prows, MD;  Location: [facility] [facility];  Service: Cardiovascular;  Laterality: N/A;   CARDIOVERSION N/A 07/03/2021   Procedure: CARDIOVERSION;  Surgeon: Rex Kras, DO;  Location: [facility];  Service: Cardiovascular;  Laterality: N/A;   CLIPPING OF ATRIAL APPENDAGE N/A 09/20/2015   Procedure: CLIPPING OF ATRIAL APPENDAGE;  Surgeon: [name], MD;  Location: [place];  Service: Open Heart Surgery;  Laterality: N/A;   COLONOSCOPY     x 2   HERNIA REPAIR     MAZE N/A 09/20/2015   Procedure: MAZE;  Surgeon: [name], MD;  Location: [place];  Service: Open Heart Surgery;  Laterality: N/A;   TEE WITHOUT CARDIOVERSION N/A 09/20/2015   Procedure: TRANSESOPHAGEAL ECHOCARDIOGRAM (TEE);  Surgeon: [name], MD;  Location: [place];  Service: Open Heart Surgery;  Laterality: N/A;   Family History  Problem Relation Age of Onset   Hypertension Mother    Breast cancer Mother    Atrial fibrillation Mother    Emphysema Father    Aneurysm Father        abdominal aortic aneurysm   COPD Father    Social History   Tobacco Use   Smoking status: Never   Smokeless tobacco: Never  Substance Use Topics   Alcohol use: Yes    Alcohol/week: 0.0 standard drinks    Comment: occasional   Marital Status: Married  ROS  Review of Systems  Constitutional: Positive for malaise/fatigue.  Cardiovascular:  Positive for dyspnea on exertion. Negative for chest pain, claudication, leg swelling, near-syncope, palpitations and  syncope.  Hematologic/Lymphatic: Does not bruise/bleed easily.  Gastrointestinal:  Negative for change in bowel habit, hematochezia and melena.  Neurological:  Positive for light-headedness. Negative for headaches.   Objective   Vitals with BMI 01/07/2022 12/17/2021 07/31/2021  Height _0  _1  _2   Weight 194 lbs 13 oz 199 lbs 191 lbs  BMI 26.41 09.98 33.8  Systolic 250 539 767  Diastolic 74 82 69  Pulse 80 83 70     Blood pressure 127/74, pulse 80, temperature (!) 96.7 F (35.9 C), temperature source Temporal, resp. rate 16, height 6' (1.829 m), weight 194 lb 12.8 oz (88.4 kg), SpO2 99 %. Body mass index is 26.42 kg/m.  Physical Exam Vitals reviewed.  Constitutional:      Appearance: Normal appearance. He is well-developed.  Cardiovascular:     Rate and Rhythm: Normal rate. Rhythm irregular.     Pulses: Intact distal pulses.          Carotid pulses are 2+ on the right side and 2+ on the left side with bruit.      Radial pulses are 2+ on the right side and 2+ on the left side.       Popliteal pulses are 2+ on the right side and 2+ on the left side.       Dorsalis pedis pulses are 1+ on the right side and 1+ on the left side.       Posterior tibial pulses are 2+ on the right side and 2+ on the left side.     Heart sounds: S1 normal and S2 normal. Murmur heard.  Scratchy early systolic murmur is present with a grade of 2/6 at the upper right sternal border radiating to the neck.    No gallop.     Comments: No JVD Pulmonary:     Effort: Pulmonary effort is normal.     Breath sounds: Normal breath sounds. No wheezing, rhonchi or rales.  Musculoskeletal:     Right lower leg: No edema.     Left lower leg: No edema.    Laboratory examination:   CMP Latest Ref Rng & Units 06/27/2021 09/26/2015 09/25/2015  Glucose 65 - 99 mg/dL 118(H) - 114(H)  BUN 8 - 27 mg/dL 13 - 19  Creatinine 0.76 - 1.27 mg/dL 1.16 - 1.10  Sodium 134 - 144 mmol/L 143 - 135  Potassium 3.5 - 5.2 mmol/L 5.1 - 3.9  Chloride 96 - 106 mmol/L 105 - 97(L)  CO2 20 - 29 mmol/L 23 - 28  Calcium 8.6 - 10.2 mg/dL 9.5 - 8.5(L)  Total Protein 6.5 - 8.1 g/dL - 5.7(L) -  Total Bilirubin 0.3 - 1.2 mg/dL - 1.3(H) -  Alkaline Phos 38 - 126 U/L - 84 -  AST 15 - 41 U/L - 89(H) -  ALT 17 - 63 U/L - 307(H) -   CBC Latest Ref Rng & Units 06/27/2021 09/25/2015 09/24/2015  WBC 3.4 - 10.8 x10E3/uL 5.1 11.0(H) 13.1(H)  Hemoglobin 13.0 - 17.7 g/dL 16.4  10.3(L) 10.3(L)  Hematocrit 37.5 - 51.0 % 48.8 31.5(L) 31.1(L)  Platelets 150 - 450 x10E3/uL 237 207 169   Lipid Panel  No results found for: CHOL, TRIG, HDL, CHOLHDL, VLDL, LDLCALC, LDLDIRECT HEMOGLOBIN A1C Lab Results  Component Value Date   HGBA1C 5.8 (H) 09/18/2015   MPG 120 09/18/2015   TSH No results for input(s): TSH in the last 8760 hours.  External labs:  10/29/2021: Total cholesterol 127, HDL 51, triglycerides 107, LDL 57  BUN 14, creatinine 0.98, GFR >60, sodium 139, potassium 4.9 TSH 2.90 Hgb 16, HCT 46.8, MCV 93.6, platelet 226  04/07/2019: HB 15.2/HCT 44.7, platelets 200.  Potassium 4.8, BUN 14, creatinine 1.0, eGFR greater than 60 minutes, total cholesterol 118, triglycerides 80, HDL 51, LDL 67.  PSA normal.  Labs 10/13/2018: HB 15.4/HCT 46.0, platelets 263.  BUN 12, creatinine 1.05, eGFR greater than 60 him a, CMP otherwise normal.  Potassium 4.7.  Total cholesterol 117, triglycerides 102, HDL 45, LDL 52.  Allergies  No Known Allergies   Medications Prior to Visit:   Outpatient Medications Prior to Visit  Medication Sig Dispense Refill   apixaban (ELIQUIS) 5 MG TABS tablet Take 1 tablet (5 mg total) by mouth 2 (two) times daily. 180 tablet 3   dronedarone (MULTAQ) 400 MG tablet Take 1 tablet (400 mg total) by mouth 2 (two) times daily with a meal. 180 tablet 3   metoprolol tartrate (LOPRESSOR) 25 MG tablet Take 1 tablet (25 mg total) by mouth 2 (two) times daily. 180 tablet 3   rosuvastatin (CRESTOR) 10 MG tablet TAKE 1 TABLET(10 MG) BY MOUTH DAILY 90 tablet 0   No facility-administered medications prior to visit.   Final Medications at End of Visit    Current Meds  Medication Sig   apixaban (ELIQUIS) 5 MG TABS tablet Take 1 tablet (5 mg total) by mouth 2 (two) times daily.   dronedarone (MULTAQ) 400 MG tablet Take 1 tablet (400 mg total) by mouth 2 (two) times daily with a meal.   metoprolol tartrate (LOPRESSOR) 25 MG tablet Take 1 tablet (25 mg total) by  mouth 2 (two) times daily.   rosuvastatin (CRESTOR) 10 MG tablet TAKE 1 TABLET(10 MG) BY MOUTH DAILY   Radiology:    No results found.   Cardiac Studies:   Bentall Procedure 09/20/15: ascending aortic aneurysm repair with 30 mm [employer] Valsalva graft and [employer] pericardial tissue valve 25 mm model 3300 TFX  CT angiogram chest 12/05/2016: Expected postoperative appearance after Bentall procedure with replacement of aortic valve, aortic root and descending thoracic aorta. 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  Carotid artery duplex  09/06/2019: Minimal soft plaque noted in bilateral carotid arteries without hemodynamically significant stenosis. Right vertebral artery flow is not visualized. Antegrade left vertebral artery flow. Follow up studies when clinically indicated.  CT angiogram chest 09/20/2019:  Redemonstration of surgical changes of Bentall procedure and left atrial clipping Native coronary artery atherosclerosis. Aortic Atherosclerosis (ICD10-I70.0). Centrilobular emphysema.  Emphysema (ICD10-J43.9). 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  PCV ECHOCARDIOGRAM COMPLETE 00/76/2263 Normal LV systolic function with visual EF 55-60%. Left ventricle cavity is normal in size. Abnormal septal wall motion due to post-operative valve. Mild left ventricular hypertrophy. Normal global wall motion. Unable to evaluate diastolic function due to atrial fibrillation. Normal LAP. S/p [facility] pericardial tissue valve 25 mm with normal functioning. No valvular or paravalvular regurgitation. Mild (Grade I) mitral regurgitation. Mild pulmonic regurgitation. Compared to study dated 09/18/2020: no significant change.  Mobile cardiac telemetry 14 days 6/14 - 05/15/2021: Dominant rhythm: Atrial fibrillation A. fib present: 100% Heart rate  40-187 bpm, average heart rate 79 bpm 0 episodes, 0% isolated SVE 5 episodes of VT, fastest 187 bpm for 6 beats, longest for 7 beats at 126 bpm 1.9% isolated VE, less than 1 couplet/triplet 100% A. fib burden seen. 1  pause in A. fib with R-R duration of 3 seconds noted.  Sinus pause greater than 3 seconds noted at 12:36 PM no associated symptoms No atrial flutter, SVT, high-grade AV block seen. 1 patient triggered event correlated with A. fib 80-90 bpm.  Direct current cardioversion 07/03/2021 Indications:  Atrial Fibrillation Cardioverted 1 time(s).  Cardioversion with synchronized biphasic 200J shock. Post procedure EKG shows:  sinus bradycardia  EKG  01/07/2022: Atrial fibrillation with controlled ventricular response at a rate of 76 bpm.  12/17/2021: Atrial fibrillation with controlled ventricular response at a rate of 86 bpm.Left axis.  Incomplete right bundle branch block.  07/31/2021: Normal sinus rhythm at rate of 60 bpm, normal axis, incomplete right bundle branch block.  Low-voltage complexes.  Normal QT interval.   06/13/2021: Atrial fibrillation with controlled ventricular response at a rate of 79 bpm.  Normal axis.  No evidence of ischemia or underlying injury pattern  Assessment     ICD-10-CM   1. Paroxysmal atrial fibrillation (HCC)  I48.0 EKG [id number]    Basic metabolic panel    CBC    2. Dyspnea on exertion  R06.09       No orders of the defined types were placed in this encounter.   There are no discontinued medications.  This patients CHA2DS2-VASc Score 2 (vasc, Age) and yearly risk of stroke 2.2%.   Recommendations:   Chad Lawrence  is a 71 y.o. 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  Servando Snare. He did have persistant A. Fib post operatively and converted to sinus 2-3 weeks later. Patient with recurrence of atrial fibrillation and underwent direct-current cardioversion on 07/03/2021. He had recurrence of atrial fibrillation in 11/2021.   Patient was therefore started on Multaq at last office visit given recurrence of atrial fibrillation.  Patient subsequently called the office concerned regarding episodes of elevated heart rate since starting Multaq. He now presents for follow up.  Patient remains in atrial fibrillation and continues to have symptoms of lightheadedness and dyspnea.  Therefore discussed repeat direct current cardioversion.  Reviewed indication, risk, benefits of the procedure, patient wishes to proceed with direct-current cardioversion.  Given that patient missed a single dose of Eliquis 1 week ago, we will schedule cardioversion in 2 to 3 weeks.  We will obtain CBC and BMP prior to this.  We will continue Eliquis and Multaq.  He is tolerating anticoagulation without bleeding diathesis.  Follow-up after cardioversion.   Alethia Berthold, PA-C 01/07/2022, 9:19 AM Office: [phone]

## 2022-01-07 ENCOUNTER — Encounter: Payer: Self-pay | Admitting: Student

## 2022-01-07 ENCOUNTER — Ambulatory Visit: Payer: Medicare Other | Admitting: Student

## 2022-01-07 ENCOUNTER — Other Ambulatory Visit: Payer: Self-pay

## 2022-01-07 VITALS — BP 127/74 | HR 80 | Temp 96.7°F | Resp 16 | Ht 72.0 in | Wt 194.8 lb

## 2022-01-07 DIAGNOSIS — I480 Paroxysmal atrial fibrillation: Secondary | ICD-10-CM

## 2022-01-07 DIAGNOSIS — R0609 Other forms of dyspnea: Secondary | ICD-10-CM

## 2022-01-21 ENCOUNTER — Encounter (HOSPITAL_COMMUNITY): Payer: Self-pay | Admitting: Cardiology

## 2022-01-21 NOTE — Progress Notes (Signed)
Attempted to obtain medical history via telephone, unable to reach at this time. I left a voicemail to return pre surgical testing department's phone call.  

## 2022-01-24 LAB — BASIC METABOLIC PANEL
BUN/Creatinine Ratio: 15 (ref 10–24)
BUN: 21 mg/dL (ref 8–27)
CO2: 23 mmol/L (ref 20–29)
Calcium: 9.2 mg/dL (ref 8.6–10.2)
Chloride: 106 mmol/L (ref 96–106)
Creatinine, Ser: 1.41 mg/dL — ABNORMAL HIGH (ref 0.76–1.27)
Glucose: 102 mg/dL — ABNORMAL HIGH (ref 70–99)
Potassium: 4.8 mmol/L (ref 3.5–5.2)
Sodium: 142 mmol/L (ref 134–144)
eGFR: 53 mL/min/{1.73_m2} — ABNORMAL LOW (ref >59)

## 2022-01-24 LAB — CBC
Hematocrit: 46.3 % (ref 37.5–51.0)
Hemoglobin: 15.8 g/dL (ref 13.0–17.7)
MCH: 31.9 pg (ref 26.6–33.0)
MCHC: 34.1 g/dL (ref 31.5–35.7)
MCV: 94 fL (ref 79–97)
Platelets: 205 10*3/uL (ref 150–450)
RBC: 4.95 x10E6/uL (ref 4.14–5.80)
RDW: 12.7 % (ref 11.6–15.4)
WBC: 5.1 10*3/uL (ref 3.4–10.8)

## 2022-01-24 NOTE — Progress Notes (Signed)
Called pt, no answer. Left VM requesting call back.

## 2022-01-24 NOTE — Progress Notes (Signed)
Pt called back, Pt aware.

## 2022-01-24 NOTE — Addendum Note (Signed)
Addended by: Lawerance Cruel L on: 01/24/2022 02:21 PM   Modules accepted: Orders

## 2022-01-26 LAB — BASIC METABOLIC PANEL
BUN/Creatinine Ratio: 10 (ref 10–24)
BUN: 13 mg/dL (ref 8–27)
CO2: 22 mmol/L (ref 20–29)
Calcium: 9.4 mg/dL (ref 8.6–10.2)
Chloride: 106 mmol/L (ref 96–106)
Creatinine, Ser: 1.26 mg/dL (ref 0.76–1.27)
Glucose: 95 mg/dL (ref 70–99)
Potassium: 5.1 mmol/L (ref 3.5–5.2)
Sodium: 142 mmol/L (ref 134–144)
eGFR: 61 mL/min/{1.73_m2} (ref >59)

## 2022-01-28 ENCOUNTER — Ambulatory Visit: Payer: Medicare Other | Admitting: Cardiology

## 2022-01-29 ENCOUNTER — Ambulatory Visit (HOSPITAL_BASED_OUTPATIENT_CLINIC_OR_DEPARTMENT_OTHER): Payer: Medicare Other | Admitting: Anesthesiology

## 2022-01-29 ENCOUNTER — Other Ambulatory Visit: Payer: Self-pay

## 2022-01-29 ENCOUNTER — Encounter (HOSPITAL_COMMUNITY): Payer: Self-pay | Admitting: Cardiology

## 2022-01-29 ENCOUNTER — Ambulatory Visit (HOSPITAL_COMMUNITY)
Admission: RE | Admit: 2022-01-29 | Discharge: 2022-01-29 | Disposition: A | Discharge status: Home / Self Care | Payer: Medicare Other | Attending: Cardiology | Admitting: Cardiology

## 2022-01-29 ENCOUNTER — Ambulatory Visit (HOSPITAL_COMMUNITY): Payer: Medicare Other | Admitting: Anesthesiology

## 2022-01-29 ENCOUNTER — Encounter (HOSPITAL_COMMUNITY)
Admission: RE | Disposition: A | Discharge status: Home / Self Care | Payer: Self-pay | Source: Home / Self Care | Attending: Cardiology

## 2022-01-29 DIAGNOSIS — E785 Hyperlipidemia, unspecified: Secondary | ICD-10-CM | POA: Diagnosis not present

## 2022-01-29 DIAGNOSIS — I480 Paroxysmal atrial fibrillation: Secondary | ICD-10-CM | POA: Diagnosis present

## 2022-01-29 DIAGNOSIS — Z7901 Long term (current) use of anticoagulants: Secondary | ICD-10-CM | POA: Diagnosis not present

## 2022-01-29 DIAGNOSIS — R0609 Other forms of dyspnea: Secondary | ICD-10-CM | POA: Diagnosis not present

## 2022-01-29 DIAGNOSIS — I4891 Unspecified atrial fibrillation: Secondary | ICD-10-CM | POA: Diagnosis not present

## 2022-01-29 DIAGNOSIS — Z79899 Other long term (current) drug therapy: Secondary | ICD-10-CM | POA: Insufficient documentation

## 2022-01-29 DIAGNOSIS — I712 Thoracic aortic aneurysm, without rupture, unspecified: Secondary | ICD-10-CM | POA: Insufficient documentation

## 2022-01-29 DIAGNOSIS — Z952 Presence of prosthetic heart valve: Secondary | ICD-10-CM | POA: Diagnosis not present

## 2022-01-29 HISTORY — DX: Cardiac arrhythmia, unspecified: I49.9

## 2022-01-29 HISTORY — PX: CARDIOVERSION: SHX1299

## 2022-01-29 SURGERY — CARDIOVERSION
Anesthesia: General

## 2022-01-29 MED ORDER — PROPOFOL 10 MG/ML IV BOLUS
INTRAVENOUS | Status: DC | PRN
  Administered 2022-01-29 (×2): 50 mg via INTRAVENOUS

## 2022-01-29 MED ORDER — SODIUM CHLORIDE 0.9 % IV SOLN: INTRAVENOUS | Status: DC

## 2022-01-29 MED ORDER — LIDOCAINE 2% (20 MG/ML) 5 ML SYRINGE
INTRAMUSCULAR | Status: DC | PRN
Start: 2022-01-29 — End: 2022-01-29
  Administered 2022-01-29: 60 mg via INTRAVENOUS

## 2022-01-29 NOTE — Discharge Instructions (Signed)

## 2022-01-29 NOTE — Interval H&P Note (Signed)
History and Physical Interval Note: ? ?01/29/2022 ?1:41 PM ? ?Chad Lawrence  has presented today for surgery, with the diagnosis of AFIB.  The various methods of treatment have been discussed with the patient and family. After consideration of risks, benefits and other options for treatment, the patient has consented to  Procedure(s): ?CARDIOVERSION (N/A) as a surgical intervention.  The patient's history has been reviewed, patient examined, no change in status, stable for surgery.  I have reviewed the patient's chart and labs.  Questions were answered to the patient's satisfaction.   ? ? ?Adrian Prows ? ? ?

## 2022-01-29 NOTE — CV Procedure (Signed)
Direct current cardioversion 01/29/2022 2:02 PM ? ?Indication symptomatic A. Fibrillation. ? ?Procedure: Using 100 mg of IV Propofol and 60 IV Lidocaine (for reducing venous pain) for achieving deep sedation, synchronized direct current cardioversion performed. Patient was delivered with 120 Joules of electricity X 1 with success to NSR. Patient tolerated the procedure well. No immediate complication noted.  ? ?Allergies as of 01/29/2022   ?No Known Allergies ?  ? ?  ?Medication List  ?  ? ?STOP taking these medications   ? ?metoprolol succinate 25 MG 24 hr tablet ?Commonly known as: TOPROL-XL ?  ? ?  ? ?TAKE these medications   ? ?apixaban 5 MG Tabs tablet ?Commonly known as: ELIQUIS ?Take 1 tablet (5 mg total) by mouth 2 (two) times daily. ?  ?metoprolol tartrate 25 MG tablet ?Commonly known as: LOPRESSOR ?Take 1 tablet (25 mg total) by mouth 2 (two) times daily. ?  ?Multaq 400 MG tablet ?Generic drug: dronedarone ?Take 1 tablet (400 mg total) by mouth 2 (two) times daily with a meal. ?  ?rosuvastatin 10 MG tablet ?Commonly known as: CRESTOR ?TAKE 1 TABLET(10 MG) BY MOUTH DAILY ?  ? ?  ? ? ? ? ?Adrian Prows, MD, [facility] ?01/29/2022, 2:02 PM ?Office: [phone] ?Fax: [phone] ?Pager: [phone]  ? ?

## 2022-01-29 NOTE — Transfer of Care (Signed)
Immediate Anesthesia Transfer of Care Note ? ?Patient: Chad Lawrence ? ?Procedure(s) Performed: CARDIOVERSION ? ?Patient Location: PACU and Endoscopy Unit ? ?Anesthesia Type:General ? ?Level of Consciousness: drowsy ? ?Airway & Oxygen Therapy: Patient Spontanous Breathing and Patient connected to nasal cannula oxygen ? ?Post-op Assessment: Report given to RN and Post -op Vital signs reviewed and stable ? ?Post vital signs: Reviewed and stable ? ?Last Vitals:  ?Vitals Value Taken Time  ?BP    ?Temp    ?Pulse    ?Resp    ?SpO2    ? ? ?Last Pain:  ?Vitals:  ? 01/29/22 1318  ?TempSrc: Temporal  ?PainSc: 0-No pain  ?   ? ?  ? ?Complications: No notable events documented. ?

## 2022-01-30 ENCOUNTER — Encounter (HOSPITAL_COMMUNITY): Payer: Self-pay | Admitting: Cardiology

## 2022-01-30 NOTE — Anesthesia Preprocedure Evaluation (Signed)
Anesthesia Evaluation  ?Patient identified by MRN, date of birth, ID band ?Patient awake ? ? ? ?Reviewed: ?Allergy & Precautions, NPO status , Patient's Chart, lab work & pertinent test results ? ?History of Anesthesia Complications ?Negative for: history of anesthetic complications ? ?Airway ?Mallampati: III ? ?TM Distance: >3 FB ?Neck ROM: Full ? ? ? Dental ? ?(+) Dental Advisory Given ?  ?Pulmonary ?neg shortness of breath, neg sleep apnea, neg COPD, neg recent URI,  ?  ?breath sounds clear to auscultation ? ? ? ? ? ? Cardiovascular ?+ dysrhythmias Atrial Fibrillation  ?Rhythm:Irregular  ? ?  ?Neuro/Psych ?negative neurological ROS ? negative psych ROS  ? GI/Hepatic ?negative GI ROS, Neg liver ROS,   ?Endo/Other  ?negative endocrine ROS ? Renal/GU ?negative Renal ROS  ? ?  ?Musculoskeletal ?negative musculoskeletal ROS ?(+)  ? Abdominal ?  ?Peds ? Hematology ? ?(+) Blood dyscrasia, , eliquis   ?Anesthesia Other Findings ? ? Reproductive/Obstetrics ? ?  ? ? ? ? ? ? ? ? ? ? ? ? ? ?  ?  ? ? ? ? ? ? ? ? ?Anesthesia Physical ?Anesthesia Plan ? ?ASA: 2 ? ?Anesthesia Plan: General  ? ?Post-op Pain Management: Minimal or no pain anticipated  ? ?Induction: Intravenous ? ?PONV Risk Score and Plan: 1 and Treatment may vary due to age or medical condition ? ?Airway Management Planned: Mask ? ?Additional Equipment: None ? ?Intra-op Plan:  ? ?Post-operative Plan:  ? ?Informed Consent: I have reviewed the patients History and Physical, chart, labs and discussed the procedure including the risks, benefits and alternatives for the proposed anesthesia with the patient or authorized representative who has indicated his/her understanding and acceptance.  ? ? ? ?Dental advisory given ? ?Plan Discussed with: CRNA and Anesthesiologist ? ?Anesthesia Plan Comments:   ? ? ? ? ? ? ?Anesthesia Quick Evaluation ? ?

## 2022-01-30 NOTE — Anesthesia Postprocedure Evaluation (Signed)
Anesthesia Post Note ? ?Patient: Chad Lawrence ? ?Procedure(s) Performed: CARDIOVERSION ? ?  ? ?Patient location during evaluation: Endoscopy ?Anesthesia Type: General ?Level of consciousness: awake and alert ?Pain management: pain level controlled ?Vital Signs Assessment: post-procedure vital signs reviewed and stable ?Respiratory status: spontaneous breathing, nonlabored ventilation and respiratory function stable ?Cardiovascular status: blood pressure returned to baseline and stable ?Postop Assessment: no apparent nausea or vomiting ?Anesthetic complications: no ? ? ?No notable events documented. ? ?Last Vitals:  ?Vitals:  ? 01/29/22 1406 01/29/22 1419  ?BP: (!) 101/55 98/64  ?Pulse: (!) 51 (!) 50  ?Resp: 15 11  ?Temp:    ?SpO2: 100% 98%  ?  ?Last Pain:  ?Vitals:  ? 01/29/22 1419  ?TempSrc:   ?PainSc: 0-No pain  ? ? ?  ?  ?  ?  ?  ?  ? ?[name] ? ? ? ? ?

## 2022-02-07 NOTE — Progress Notes (Signed)
? ?Primary Physician/Referring:  [name], MD ? ?Patient ID: Chad Lawrence, male    DOB: Jan 16, 1951, 71 y.o.   MRN: [id number] ? ?Chief Complaint  ?Patient presents with  ? Atrial Fibrillation  ? Follow-up  ?  Post cardoversion  ? ?HPI:   ? ?Chad Lawrence  is a 71 y.o. 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 Servando Snare. He did have persistant A. Fib post operatively and converted to sinus 2-3 weeks later. Patient with recurrence of atrial fibrillation and underwent direct-current cardioversion on 07/03/2021. He had recurrence of atrial fibrillation in 11/2021.  ? ?Patient was therefore started on Multaq and Eliquis.  He subsequently underwent direct-current cardioversion 01/29/2022 and presents for follow-up.  Patient is feeling well overall, however he does continue to have occasional episodes of lightheadedness and has noticed his heart rate as low as 47 bpm, primarily at night while sleeping.  He continues to tolerate anticoagulation without bleeding diathesis.  Patient states his energy level has improved, in fact he rode 20 miles on his bicycle yesterday without issue.  Following cardioversion patient was switched from Toprol-XL 25 mg daily to Lopressor 25 mg p.o. twice daily. ? ?Denies dyspnea, palpitations, chest pain. ? ?Past Medical History:  ?Diagnosis Date  ? Aortic regurgitation   ? Arrhythmia   ? Ascending aorta dilatation (HCC)   ? Atrial fibrillation ([place])   ? Hyperlipidemia   ? Pneumonia   ? ?Past Surgical History:  ?Procedure Laterality Date  ? AORTIC VALVE REPLACEMENT    ? BENTALL PROCEDURE N/A 09/20/2015  ? Procedure: BENTALL PROCEDURE;  Surgeon: [name], MD;  Location: [place];  Service: Open Heart Surgery;   Laterality: N/A;  ? CARDIAC CATHETERIZATION N/A 09/12/2015  ? Procedure: Left Heart Cath and Coronary Angiography;  Surgeon: Adrian Prows, MD;  Location: [facility];  Service: Cardiovascular;  Laterality: N/A;  ? CARDIOVERSION N/A 07/03/2021  ? Procedure: CARDIOVERSION;  Surgeon: Rex Kras, DO;  Location: [facility];  Service: Cardiovascular;  Laterality: N/A;  ? CARDIOVERSION N/A 01/29/2022  ? Procedure: CARDIOVERSION;  Surgeon: Adrian Prows, MD;  Location: [place];  Service: Cardiovascular;  Laterality: N/A;  ? CLIPPING OF ATRIAL APPENDAGE N/A 09/20/2015  ? Procedure: CLIPPING OF ATRIAL APPENDAGE;  Surgeon: [name], MD;  Location: [place];  Service: Open Heart Surgery;  Laterality: N/A;  ? COLONOSCOPY    ? x 2  ? HERNIA REPAIR    ? MAZE N/A 09/20/2015  ? Procedure: MAZE;  Surgeon: [name], MD;  Location: [place];  Service: Open Heart Surgery;  Laterality: N/A;  ? TEE WITHOUT CARDIOVERSION N/A 09/20/2015  ? Procedure: TRANSESOPHAGEAL ECHOCARDIOGRAM (TEE);  Surgeon: [name], MD;  Location: [facility];  Service: Open Heart Surgery;  Laterality: N/A;  ? ?Family History  ?Problem Relation Age of Onset  ? Hypertension Mother   ? Breast cancer Mother   ? Atrial fibrillation Mother   ? Emphysema Father   ? Aneurysm Father   ?     abdominal aortic aneurysm  ? COPD Father   ? ?Social History  ? ?Tobacco Use  ? Smoking status: Never  ? Smokeless tobacco: Never  ?Substance Use Topics  ? Alcohol use: Yes  ?  Alcohol/week: 0.0 standard drinks  ?  Comment:  occasional  ? ?Marital Status: Married ? ?ROS  ?Review of Systems  ?Constitutional: Negative for malaise/fatigue (improved).  ?Cardiovascular:  Negative for chest pain, claudication, dyspnea on exertion, leg swelling, near-syncope, palpitations and syncope.  ?Hematologic/Lymphatic: Does not bruise/bleed easily.  ?Gastrointestinal:  Negative for change in bowel habit, hematochezia and melena.  ?Neurological:  Positive for light-headedness  (occsaional). Negative for headaches.  ? ?Objective  ? ? ?  02/08/2022  ?  9:00 AM 01/29/2022  ?  2:19 PM 01/29/2022  ?  2:06 PM  ?Vitals with BMI  ?Height '6\' 0"'     ?Weight 195 lbs    ?BMI 26.44    ?Systolic 092 98 330  ?Diastolic 76 64 55  ?Pulse 60 50 51  ?  ?Blood pressure 130/76, pulse 60, temperature (!) 97.2 ?F (36.2 ?C), resp. rate 16, height 6' (1.829 m), weight 195 lb (88.5 kg), SpO2 98 %. Body mass index is 26.45 kg/m?. ? ?Physical Exam ?Vitals reviewed.  ?Constitutional:   ?   Appearance: Normal appearance. He is well-developed.  ?Cardiovascular:  ?   Rate and Rhythm: Normal rate and regular rhythm.  ?   Pulses: Intact distal pulses.     ?     Carotid pulses are 2+ on the right side and 2+ on the left side with bruit. ?     Radial pulses are 2+ on the right side and 2+ on the left side.  ?     Popliteal pulses are 2+ on the right side and 2+ on the left side.  ?     Dorsalis pedis pulses are 1+ on the right side and 1+ on the left side.  ?     Posterior tibial pulses are 2+ on the right side and 2+ on the left side.  ?   Heart sounds: S1 normal and S2 normal. Murmur heard.  ?Scratchy early systolic murmur is present with a grade of 2/6 at the upper right sternal border radiating to the neck.  ?  No gallop.  ?   Comments: No JVD ?Pulmonary:  ?   Effort: Pulmonary effort is normal.  ?   Breath sounds: Normal breath sounds. No wheezing, rhonchi or rales.  ?Musculoskeletal:  ?   Right lower leg: No edema.  ?   Left lower leg: No edema.  ? ? ?Laboratory examination:  ? ? ?  Latest Ref Rng & Units 01/25/2022  ?  9:25 AM 01/23/2022  ?  9:36 AM 06/27/2021  ?  9:43 AM  ?CMP  ?Glucose 70 - 99 mg/dL 95   102   118    ?BUN 8 - 27 mg/dL '13   21   13    ' ?Creatinine 0.76 - 1.27 mg/dL 1.26   1.41   1.16    ?Sodium 134 - 144 mmol/L 142   142   143    ?Potassium 3.5 - 5.2 mmol/L 5.1   4.8   5.1    ?Chloride 96 - 106 mmol/L 106   106   105    ?CO2 20 - 29 mmol/L '22   23   23    ' ?Calcium 8.6 - 10.2 mg/dL 9.4   9.2   9.5    ? ? ?   Latest Ref Rng & Units 01/23/2022  ?  9:36 AM 06/27/2021  ?  9:43 AM 09/25/2015  ?  4:15 AM  ?CBC  ?WBC 3.4 - 10.8 x10E3/uL 5.1   5.1   11.0    ?  Hemoglobin 13.0 - 17.7 g/dL 15.8   16.4   10.3    ?Hematocrit 37.5 - 51.0 % 46.3   48.8   31.5    ?Platelets 150 - 450 x10E3/uL 205   237   207    ? ?Lipid Panel  ?No results found for: CHOL, TRIG, HDL, CHOLHDL, VLDL, LDLCALC, LDLDIRECT ?HEMOGLOBIN A1C ?Lab Results  ?Component Value Date  ? HGBA1C 5.8 (H) 09/18/2015  ? MPG 120 09/18/2015  ? ?TSH ?No results for input(s): TSH in the last 8760 hours. ? ?External labs:  ?10/29/2021: ?Total cholesterol 127, HDL 51, triglycerides 107, LDL 57 ?BUN 14, creatinine 0.98, GFR >60, sodium 139, potassium 4.9 ?TSH 2.90 ?Hgb 16, HCT 46.8, MCV 93.6, platelet 226 ? ?04/07/2019: HB 15.2/HCT 44.7, platelets 200.  Potassium 4.8, BUN 14, creatinine 1.0, eGFR greater than 60 minutes, total cholesterol 118, triglycerides 80, HDL 51, LDL 67.  PSA normal. ? ?Labs 10/13/2018: HB 15.4/HCT 46.0, platelets 263.  BUN 12, creatinine 1.05, eGFR greater than 60 him a, CMP otherwise normal.  Potassium 4.7.  Total cholesterol 117, triglycerides 102, HDL 45, LDL 52. ? ?Allergies  ?No Known Allergies  ? ?Medications Prior to Visit:  ? ?Outpatient Medications Prior to Visit  ?Medication Sig Dispense Refill  ? apixaban (ELIQUIS) 5 MG TABS tablet Take 1 tablet (5 mg total) by mouth 2 (two) times daily. 180 tablet 3  ? dronedarone (MULTAQ) 400 MG tablet Take 1 tablet (400 mg total) by mouth 2 (two) times daily with a meal. 180 tablet 3  ? rosuvastatin (CRESTOR) 10 MG tablet TAKE 1 TABLET(10 MG) BY MOUTH DAILY 90 tablet 0  ? metoprolol tartrate (LOPRESSOR) 25 MG tablet Take 1 tablet (25 mg total) by mouth 2 (two) times daily. 180 tablet 3  ? ?No facility-administered medications prior to visit.  ? ?Final Medications at End of Visit   ? ?Current Meds  ?Medication Sig  ? apixaban (ELIQUIS) 5 MG TABS tablet Take 1 tablet (5 mg total) by mouth 2 (two) times daily.  ?  dronedarone (MULTAQ) 400 MG tablet Take 1 tablet (400 mg total) by mouth 2 (two) times daily with a meal.  ? rosuvastatin (CRESTOR) 10 MG tablet TAKE 1 TABLET(10 MG) BY MOUTH DAILY  ? [DISCONTINUED] metoprolol tartrat

## 2022-02-08 ENCOUNTER — Encounter: Payer: Self-pay | Admitting: Student

## 2022-02-08 ENCOUNTER — Other Ambulatory Visit: Payer: Self-pay

## 2022-02-08 ENCOUNTER — Ambulatory Visit: Payer: Medicare Other | Admitting: Student

## 2022-02-08 VITALS — BP 130/76 | HR 60 | Temp 97.2°F | Resp 16 | Ht 72.0 in | Wt 195.0 lb

## 2022-02-08 DIAGNOSIS — Z952 Presence of prosthetic heart valve: Secondary | ICD-10-CM

## 2022-02-08 DIAGNOSIS — I480 Paroxysmal atrial fibrillation: Secondary | ICD-10-CM

## 2022-02-08 DIAGNOSIS — Z95828 Presence of other vascular implants and grafts: Secondary | ICD-10-CM

## 2022-02-08 MED ORDER — METOPROLOL TARTRATE 25 MG PO TABS: 12.5000 mg | ORAL_TABLET | Freq: Two times a day (BID) | ORAL | 3 refills | Status: DC

## 2022-02-18 ENCOUNTER — Telehealth: Payer: Self-pay

## 2022-02-18 NOTE — Telephone Encounter (Signed)
Patient called and stated that he had a cardioversion done the 14th of March and went back into Afib this past Friday the 31st of March. Patient would like to know if he needs to make an appointment to make changes to his medications. Please advise.  ?

## 2022-02-18 NOTE — Telephone Encounter (Signed)
You can add him to my schedule tomorrow and we can reevaluate

## 2022-02-19 ENCOUNTER — Encounter: Payer: Self-pay | Admitting: Student

## 2022-02-19 ENCOUNTER — Ambulatory Visit: Payer: Medicare Other | Admitting: Student

## 2022-02-19 VITALS — BP 112/74 | HR 82 | Temp 97.9°F | Resp 16 | Ht 72.0 in | Wt 191.0 lb

## 2022-02-19 DIAGNOSIS — I480 Paroxysmal atrial fibrillation: Secondary | ICD-10-CM

## 2022-02-19 MED ORDER — AMIODARONE HCL 200 MG PO TABS: ORAL_TABLET | ORAL | 1 refills | Status: DC

## 2022-02-19 NOTE — Telephone Encounter (Signed)
Spoke with patient, transferred call to [phone] [facility] to schedule appointment for today.

## 2022-02-19 NOTE — Progress Notes (Signed)
? ?Primary Physician/Referring:  [name], MD ? ?Patient ID: Chad Lawrence, male    DOB: January 23, 1951, 71 y.o.   MRN: [id number] ? ?Chief Complaint  ?Patient presents with  ? Atrial Fibrillation  ? Follow-up  ?  Post cardioversion  ? medication changes  ? ?HPI:   ? ?Chad Lawrence  is a 71 y.o. 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 Servando Snare. He did have persistant A. Fib post operatively and converted to sinus 2-3 weeks later. Patient with recurrence of atrial fibrillation and underwent direct-current cardioversion on 07/03/2021. He had recurrence of atrial fibrillation in 11/2021.  ? ?Patient was therefore started on Multaq and Eliquis.  He subsequently underwent direct-current cardioversion 01/29/2022. Following cardioversion patient was switched from Toprol-XL 25 mg daily to Lopressor 25 mg p.o. twice daily.  Patient was maintaining normal sinus rhythm at last office visit.  However he presents today for urgent visit at his request with concerns of recurrence of A-fib.  Patient states that 3 days ago he began to feel lightheaded and was alerted by his smart watch that he was in atrial fibrillation.  Patient's heart rate remains well controlled averaging 70s beats per minute at home.  Denies chest pain, palpitations, dyspnea.  Continues to tolerate anticoagulation without bleeding diathesis. ? ?Past Medical History:  ?Diagnosis Date  ? Aortic regurgitation   ? Arrhythmia   ? Ascending aorta dilatation (HCC)   ? Atrial fibrillation ([place])   ? Hyperlipidemia   ? Pneumonia   ? ?Past Surgical History:  ?Procedure Laterality Date  ? AORTIC VALVE REPLACEMENT    ? BENTALL PROCEDURE N/A 09/20/2015  ? Procedure: BENTALL PROCEDURE;  Surgeon:  [name], MD;  Location: [place];  Service: Open Heart Surgery;  Laterality: N/A;  ? CARDIAC CATHETERIZATION N/A 09/12/2015  ? Procedure: Left Heart Cath and Coronary Angiography;  Surgeon: Adrian Prows, MD;  Location: [facility];  Service: Cardiovascular;  Laterality: N/A;  ? CARDIOVERSION N/A 07/03/2021  ? Procedure: CARDIOVERSION;  Surgeon: Rex Kras, DO;  Location: [place] [facility];  Service: Cardiovascular;  Laterality: N/A;  ? CARDIOVERSION N/A 01/29/2022  ? Procedure: CARDIOVERSION;  Surgeon: Adrian Prows, MD;  Location: [place];  Service: Cardiovascular;  Laterality: N/A;  ? CLIPPING OF ATRIAL APPENDAGE N/A 09/20/2015  ? Procedure: CLIPPING OF ATRIAL APPENDAGE;  Surgeon: [name], MD;  Location: [place];  Service: Open Heart Surgery;  Laterality: N/A;  ? COLONOSCOPY    ? x 2  ? HERNIA REPAIR    ? MAZE N/A 09/20/2015  ? Procedure: MAZE;  Surgeon: [name], MD;  Location: [place];  Service: Open Heart Surgery;  Laterality: N/A;  ? TEE WITHOUT CARDIOVERSION N/A 09/20/2015  ? Procedure: TRANSESOPHAGEAL ECHOCARDIOGRAM (TEE);  Surgeon: [name], MD;  Location: [place];  Service: Open Heart Surgery;  Laterality: N/A;  ? ?Family History  ?Problem Relation Age of Onset  ? Hypertension Mother   ? Breast cancer Mother   ? Atrial fibrillation Mother   ? Emphysema Father   ? Aneurysm Father   ?     abdominal aortic aneurysm  ? COPD Father   ? ?Social History  ? ?Tobacco Use  ? Smoking status: Never  ? Smokeless tobacco: Never  ?Substance Use Topics  ? Alcohol  use: Yes  ?  Alcohol/week: 0.0 standard drinks  ?  Comment: occasional  ? ?Marital Status: Married ? ?ROS  ?Review of Systems  ?Constitutional: Negative for malaise/fatigue (improved).  ?Cardiovascular:  Negative for chest pain, claudication, dyspnea on exertion, leg swelling, near-syncope, palpitations and syncope.  ?Hematologic/Lymphatic: Does not bruise/bleed easily.  ?Gastrointestinal:  Negative for change in bowel habit,  hematochezia and melena.  ?Neurological:  Positive for light-headedness (occsaional). Negative for headaches.  ? ?Objective  ? ? ?  02/19/2022  ?  2:46 PM 02/08/2022  ?  9:00 AM 01/29/2022  ?  2:19 PM  ?Vitals with BMI  ?Height _0  _1    ?Weight 191 lbs 195 lbs   ?BMI 25.9 26.44   ?Systolic 903 009 98  ?Diastolic 74 76 64  ?Pulse 82 60 50  ?  ?Blood pressure 112/74, pulse 82, temperature 97.9 ?F (36.6 ?C), resp. rate 16, height 6' (1.829 m), weight 191 lb (86.6 kg), SpO2 98 %. Body mass index is 25.9 kg/m?. ? ?Physical Exam ?Vitals reviewed.  ?Constitutional:   ?   Appearance: Normal appearance. He is well-developed.  ?Cardiovascular:  ?   Rate and Rhythm: Normal rate. Rhythm irregular.  ?   Pulses: Intact distal pulses.     ?     Carotid pulses are 2+ on the right side and 2+ on the left side with bruit. ?     Radial pulses are 2+ on the right side and 2+ on the left side.  ?     Popliteal pulses are 2+ on the right side and 2+ on the left side.  ?     Dorsalis pedis pulses are 1+ on the right side and 1+ on the left side.  ?     Posterior tibial pulses are 2+ on the right side and 2+ on the left side.  ?   Heart sounds: S1 normal and S2 normal. Murmur heard.  ?Scratchy early systolic murmur is present with a grade of 2/6 at the upper right sternal border radiating to the neck.  ?  No gallop.  ?   Comments: No JVD ?Pulmonary:  ?   Effort: Pulmonary effort is normal.  ?   Breath sounds: Normal breath sounds. No wheezing, rhonchi or rales.  ?Musculoskeletal:  ?   Right lower leg: No edema.  ?   Left lower leg: No edema.  ? ? ?Laboratory examination:  ? ? ?  Latest Ref Rng & Units 01/25/2022  ?  9:25 AM 01/23/2022  ?  9:36 AM 06/27/2021  ?  9:43 AM  ?CMP  ?Glucose 70 - 99 mg/dL 95   102   118    ?BUN 8 - 27 mg/dL _2 ?Creatinine 0.76 - 1.27 mg/dL 1.26   1.41   1.16    ?Sodium 134 - 144 mmol/L 142   142   143    ?Potassium 3.5 - 5.2 mmol/L 5.1   4.8   5.1    ?Chloride 96 - 106 mmol/L 106   106   105    ?CO2 20  - 29 mmol/L _3 ?Calcium 8.6 - 10.2 mg/dL 9.4   9.2   9.5    ? ? ?  Latest Ref Rng & Units 01/23/2022  ?  9:36 AM 06/27/2021  ?  9:43 AM 09/25/2015  ?  4:15 AM  ?CBC  ?WBC 3.4 -  10.8 x10E3/uL 5.1   5.1   11.0    ?Hemoglobin 13.0 - 17.7 g/dL 15.8   16.4   10.3    ?Hematocrit 37.5 - 51.0 % 46.3   48.8   31.5    ?Platelets 150 - 450 x10E3/uL 205   237   207    ? ?Lipid Panel  ?No results found for: CHOL, TRIG, HDL, CHOLHDL, VLDL, LDLCALC, LDLDIRECT ?HEMOGLOBIN A1C ?Lab Results  ?Component Value Date  ? HGBA1C 5.8 (H) 09/18/2015  ? MPG 120 09/18/2015  ? ?TSH ?No results for input(s): TSH in the last 8760 hours. ? ?External labs:  ?10/29/2021: ?Total cholesterol 127, HDL 51, triglycerides 107, LDL 57 ?BUN 14, creatinine 0.98, GFR >60, sodium 139, potassium 4.9 ?TSH 2.90 ?Hgb 16, HCT 46.8, MCV 93.6, platelet 226 ? ?04/07/2019: HB 15.2/HCT 44.7, platelets 200.  Potassium 4.8, BUN 14, creatinine 1.0, eGFR greater than 60 minutes, total cholesterol 118, triglycerides 80, HDL 51, LDL 67.  PSA normal. ? ?Labs 10/13/2018: HB 15.4/HCT 46.0, platelets 263.  BUN 12, creatinine 1.05, eGFR greater than 60 him a, CMP otherwise normal.  Potassium 4.7.  Total cholesterol 117, triglycerides 102, HDL 45, LDL 52. ? ?Allergies  ?No Known Allergies  ? ?Medications Prior to Visit:  ? ?Outpatient Medications Prior to Visit  ?Medication Sig Dispense Refill  ? apixaban (ELIQUIS) 5 MG TABS tablet Take 1 tablet (5 mg total) by mouth 2 (two) times daily. 180 tablet 3  ? metoprolol tartrate (LOPRESSOR) 25 MG tablet Take 0.5 tablets (12.5 mg total) by mouth 2 (two) times daily. 180 tablet 3  ? rosuvastatin (CRESTOR) 10 MG tablet TAKE 1 TABLET(10 MG) BY MOUTH DAILY 90 tablet 0  ? dronedarone (MULTAQ) 400 MG tablet Take 1 tablet (400 mg total) by mouth 2 (two) times daily with a meal. 180 tablet 3  ? ?No facility-administered medications prior to visit.  ? ?Final Medications at End of Visit   ? ?Current Meds  ?Medication Sig  ? amiodarone  (PACERONE) 200 MG tablet Take 200 mg twice daily for 7 days. Then take 200 mg once daily from then on.  ? apixaban (ELIQUIS) 5 MG TABS tablet Take 1 tablet (5 mg total) by mouth 2 (two) times daily.  ? metoprolol tartr

## 2022-03-19 ENCOUNTER — Ambulatory Visit: Payer: Medicare Other | Admitting: Student

## 2022-03-19 ENCOUNTER — Encounter: Payer: Self-pay | Admitting: Student

## 2022-03-19 VITALS — BP 136/75 | HR 84 | Temp 97.3°F | Resp 17 | Ht 72.0 in | Wt 191.4 lb

## 2022-03-19 DIAGNOSIS — I480 Paroxysmal atrial fibrillation: Secondary | ICD-10-CM

## 2022-03-19 DIAGNOSIS — Z95828 Presence of other vascular implants and grafts: Secondary | ICD-10-CM

## 2022-03-19 DIAGNOSIS — Z952 Presence of prosthetic heart valve: Secondary | ICD-10-CM

## 2022-03-19 NOTE — Progress Notes (Signed)
? ?Primary Physician/Referring:  [name], MD ? ?Patient ID: Chad Lawrence, male    DOB: 1951-11-11, 71 y.o.   MRN: [id number] ? ?Chief Complaint  ?Patient presents with  ? Atrial Fibrillation  ?  4 WEEKS  ? Abnormal ECG  ? ?HPI:   ? ?Chad Lawrence  is a 71 y.o. 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 Servando Snare. He did have persistant A. Fib post operatively and converted to sinus 2-3 weeks later. Patient with recurrence of atrial fibrillation and underwent direct-current cardioversion on 07/03/2021. He had recurrence of atrial fibrillation in 11/2021.  ? ?Patient was therefore started on Multaq and Eliquis.  He subsequently underwent direct-current cardioversion 01/29/2022. Following cardioversion patient was switched from Toprol-XL 25 mg daily to Lopressor 25 mg p.o. twice daily.  He had recurrence of atrial fibrillation at last office visit, was therefore switched from Multaq to amiodarone. ? ?Patient presents for 4-week follow-up.  He is feeling well overall and has had no known recurrence of atrial fibrillation since last office visit.  He continues to tolerate anticoagulation without bleeding diathesis.  He does still have occasional brief episodes of lightheadedness upon standing. ? ?Denies chest pain, palpitations, dyspnea.   ? ?Past Medical History:  ?Diagnosis Date  ? Aortic regurgitation   ? Arrhythmia   ? Ascending aorta dilatation (HCC)   ? Atrial fibrillation ([place])   ? Hyperlipidemia   ? Pneumonia   ? ?Past Surgical History:  ?Procedure Laterality Date  ? AORTIC VALVE REPLACEMENT    ? BENTALL PROCEDURE N/A 09/20/2015  ? Procedure: BENTALL PROCEDURE;  Surgeon: [name], MD;  Location: [place];  Service: Open Heart  Surgery;  Laterality: N/A;  ? CARDIAC CATHETERIZATION N/A 09/12/2015  ? Procedure: Left Heart Cath and Coronary Angiography;  Surgeon: Adrian Prows, MD;  Location: [place] [facility];  Service: Cardiovascular;  Laterality: N/A;  ? CARDIOVERSION N/A 07/03/2021  ? Procedure: CARDIOVERSION;  Surgeon: Rex Kras, DO;  Location: [place] [facility];  Service: Cardiovascular;  Laterality: N/A;  ? CARDIOVERSION N/A 01/29/2022  ? Procedure: CARDIOVERSION;  Surgeon: Adrian Prows, MD;  Location: [place];  Service: Cardiovascular;  Laterality: N/A;  ? CLIPPING OF ATRIAL APPENDAGE N/A 09/20/2015  ? Procedure: CLIPPING OF ATRIAL APPENDAGE;  Surgeon: [name], MD;  Location: [place];  Service: Open Heart Surgery;  Laterality: N/A;  ? COLONOSCOPY    ? x 2  ? HERNIA REPAIR    ? MAZE N/A 09/20/2015  ? Procedure: MAZE;  Surgeon: [name], MD;  Location: [place];  Service: Open Heart Surgery;  Laterality: N/A;  ? TEE WITHOUT CARDIOVERSION N/A 09/20/2015  ? Procedure: TRANSESOPHAGEAL ECHOCARDIOGRAM (TEE);  Surgeon: [name], MD;  Location: [place];  Service: Open Heart Surgery;  Laterality: N/A;  ? ?Family History  ?Problem Relation Age of Onset  ? Hypertension Mother   ? Breast cancer Mother   ? Atrial fibrillation Mother   ? Emphysema Father   ? Aneurysm Father   ?     abdominal aortic aneurysm  ? COPD Father   ? ?Social History  ? ?Tobacco Use  ? Smoking status: Never  ? Smokeless tobacco: Never  ?Substance Use Topics  ? Alcohol use: Yes  ?  Alcohol/week: 0.0 standard drinks  ?  Comment: occasional  ? ?Marital Status: Married ? ?ROS  ?Review of Systems  ?Constitutional: Negative for malaise/fatigue (improved).  ?Cardiovascular:  Negative for chest pain, claudication, dyspnea on exertion, leg swelling, near-syncope, palpitations and syncope.  ?Hematologic/Lymphatic: Does not bruise/bleed easily.  ?Gastrointestinal:  Negative for change in bowel habit, hematochezia and melena.  ?Neurological:  Positive for light-headedness  (occsaional). Negative for headaches.  ? ?Objective  ? ? ?  03/19/2022  ?  8:52 AM 02/19/2022  ?  2:46 PM 02/08/2022  ?  9:00 AM  ?Vitals with BMI  ?Height _0  _1  _2   ?Weight 191 lbs 6 oz 191 lbs 195 lbs  ?BMI 25.95 25.9 26.44  ?Systolic 956 387 564  ?Diastolic 75 74 76  ?Pulse 84 82 60  ?  ?Blood pressure 136/75, pulse 84, temperature (!) 97.3 ?F (36.3 ?C), temperature source Temporal, resp. rate 17, height 6' (1.829 m), weight 191 lb 6.4 oz (86.8 kg), SpO2 97 %. Body mass index is 25.96 kg/m?. ? ?Physical Exam ?Vitals reviewed.  ?Constitutional:   ?   Appearance: Normal appearance. He is well-developed.  ?Cardiovascular:  ?   Rate and Rhythm: Normal rate and regular rhythm. Occasional Extrasystoles are present. ?   Pulses: Intact distal pulses.     ?     Carotid pulses are 2+ on the right side and 2+ on the left side with bruit. ?     Radial pulses are 2+ on the right side and 2+ on the left side.  ?     Popliteal pulses are 2+ on the right side and 2+ on the left side.  ?     Dorsalis pedis pulses are 1+ on the right side and 1+ on the left side.  ?     Posterior tibial pulses are 2+ on the right side and 2+ on the left side.  ?   Heart sounds: S1 normal and S2 normal. Murmur heard.  ?Scratchy early systolic murmur is present with a grade of 2/6 at the upper right sternal border radiating to the neck.  ?  No gallop.  ?   Comments: No JVD ?Pulmonary:  ?   Effort: Pulmonary effort is normal.  ?   Breath sounds: Normal breath sounds. No wheezing, rhonchi or rales.  ?Musculoskeletal:  ?   Right lower leg: No edema.  ?   Left lower leg: No edema.  ? ? ?Laboratory examination:  ? ? ?  Latest Ref Rng & Units 01/25/2022  ?  9:25 AM 01/23/2022  ?  9:36 AM 06/27/2021  ?  9:43 AM  ?CMP  ?Glucose 70 - 99 mg/dL 95   102   118    ?BUN 8 - 27 mg/dL _3 ?Creatinine 0.76 - 1.27 mg/dL 1.26   1.41   1.16    ?Sodium 134 - 144 mmol/L 142   142   143    ?Potassium 3.5 - 5.2 mmol/L 5.1   4.8   5.1    ?Chloride 96 - 106  mmol/L 106   106   105    ?CO2 20 - 29 mmol/L _4 ?Calcium 8.6 - 10.2 mg/dL 9.4   9.2   9.5    ? ? ?  Latest Ref Rng & Units 01/23/2022  ?  9:36 AM 06/27/2021  ?  9:43 AM 09/25/2015  ?  4:15 AM  ?CBC  ?  WBC 3.4 - 10.8 x10E3/uL 5.1   5.1   11.0    ?Hemoglobin 13.0 - 17.7 g/dL 15.8   16.4   10.3    ?Hematocrit 37.5 - 51.0 % 46.3   48.8   31.5    ?Platelets 150 - 450 x10E3/uL 205   237   207    ? ?Lipid Panel  ?No results found for: CHOL, TRIG, HDL, CHOLHDL, VLDL, LDLCALC, LDLDIRECT ?HEMOGLOBIN A1C ?Lab Results  ?Component Value Date  ? HGBA1C 5.8 (H) 09/18/2015  ? MPG 120 09/18/2015  ? ?TSH ?No results for input(s): TSH in the last 8760 hours. ? ?External labs:  ?10/29/2021: ?Total cholesterol 127, HDL 51, triglycerides 107, LDL 57 ?BUN 14, creatinine 0.98, GFR >60, sodium 139, potassium 4.9 ?TSH 2.90 ?Hgb 16, HCT 46.8, MCV 93.6, platelet 226 ? ?04/07/2019: HB 15.2/HCT 44.7, platelets 200.  Potassium 4.8, BUN 14, creatinine 1.0, eGFR greater than 60 minutes, total cholesterol 118, triglycerides 80, HDL 51, LDL 67.  PSA normal. ? ?Labs 10/13/2018: HB 15.4/HCT 46.0, platelets 263.  BUN 12, creatinine 1.05, eGFR greater than 60 him a, CMP otherwise normal.  Potassium 4.7.  Total cholesterol 117, triglycerides 102, HDL 45, LDL 52. ? ?Allergies  ?No Known Allergies  ? ?Medications Prior to Visit:  ? ?Outpatient Medications Prior to Visit  ?Medication Sig Dispense Refill  ? amiodarone (PACERONE) 200 MG tablet Take 200 mg twice daily for 7 days. Then take 200 mg once daily from then on. 90 tablet 1  ? apixaban (ELIQUIS) 5 MG TABS tablet Take 1 tablet (5 mg total) by mouth 2 (two) times daily. 180 tablet 3  ? fluticasone (FLONASE) 50 MCG/ACT nasal spray Place into both nostrils daily.    ? metoprolol tartrate (LOPRESSOR) 25 MG tablet Take 0.5 tablets (12.5 mg total) by mouth 2 (two) times daily. 180 tablet 3  ? rosuvastatin (CRESTOR) 10 MG tablet TAKE 1 TABLET(10 MG) BY MOUTH DAILY 90 tablet 0  ? ?No  facility-administered medications prior to visit.  ? ?Final Medications at End of Visit   ? ?Current Meds  ?Medication Sig  ? amiodarone (PACERONE) 200 MG tablet Take 200 mg twice daily for 7 days. Then take 200 mg once daily

## 2022-05-23 ENCOUNTER — Other Ambulatory Visit: Payer: Self-pay | Admitting: Student

## 2022-05-23 ENCOUNTER — Telehealth: Payer: Self-pay

## 2022-05-23 MED ORDER — AMIODARONE HCL 200 MG PO TABS: ORAL_TABLET | ORAL | 1 refills | Status: DC

## 2022-05-23 NOTE — Telephone Encounter (Signed)
VM : [id number]  Patient called and left a message on VM.  Message is as follows:  "My name is Lorrin Nawrot I'm a patient there and I'm just checking on a one of my prescriptions I think it's called PACERONE which also is known I guess is amiodarone and I'm down to about 3 days left on my Supply on that and I changed my prescription from. [facility] to [facility] but I haven't heard from [facility] yet so I'm going to be running out before that either takes effect or whatever is there a way I can get a short supply Tuesday [facility] of this particular medication like I said I'm down to about three or 4 days on it my name again is Abdulla Pooley and my phone number is [phone]."  Refill request 30 day supply to [facility]

## 2022-05-31 ENCOUNTER — Encounter: Payer: Self-pay | Admitting: Cardiology

## 2022-05-31 ENCOUNTER — Telehealth: Payer: Self-pay

## 2022-05-31 DIAGNOSIS — I480 Paroxysmal atrial fibrillation: Secondary | ICD-10-CM

## 2022-05-31 MED ORDER — AMIODARONE HCL 200 MG PO TABS: 200.0000 mg | ORAL_TABLET | Freq: Every day | ORAL | 1 refills | Status: DC

## 2022-05-31 NOTE — Telephone Encounter (Signed)
ICD-10-CM   1. Paroxysmal atrial fibrillation (HCC)  I48.0 amiodarone (PACERONE) 200 MG tablet     Meds ordered this encounter  Medications   amiodarone (PACERONE) 200 MG tablet    Sig: Take 1 tablet (200 mg total) by mouth daily.    Dispense:  90 tablet    Refill:  1

## 2022-05-31 NOTE — Telephone Encounter (Signed)
Pt pharmacy called and stated that the pt is requesting a 90 day prescription with 3 refills for his Amiodarone. Pt is running low and is at risk of running out. They would like for [place] to send over a prescription. Please advise.

## 2022-06-04 NOTE — Telephone Encounter (Signed)
Please screen him for [facility] study

## 2022-08-12 ENCOUNTER — Ambulatory Visit: Payer: Medicare Other | Admitting: Student

## 2022-10-23 ENCOUNTER — Other Ambulatory Visit: Payer: Self-pay

## 2022-10-23 MED ORDER — APIXABAN 5 MG PO TABS: 5.0000 mg | ORAL_TABLET | Freq: Two times a day (BID) | ORAL | 3 refills | Status: DC

## 2022-10-30 ENCOUNTER — Telehealth: Payer: Self-pay | Admitting: Cardiology

## 2022-10-30 NOTE — Telephone Encounter (Signed)
Patient is participating in OCEANIC-AF (Asundexian - factor XIa inhibitor PO QD vs Apixaban PO BID in patients with A. Fib for stroke prevention) clinical trial. Oceanic-AF trial medication will be discontinued as of 12Dec2023.  Patient will restart Eliquis in clinic today.

## 2022-11-25 ENCOUNTER — Other Ambulatory Visit: Payer: Self-pay | Admitting: Cardiology

## 2022-12-10 ENCOUNTER — Other Ambulatory Visit: Payer: Self-pay | Admitting: Urology

## 2022-12-10 DIAGNOSIS — R972 Elevated prostate specific antigen [PSA]: Secondary | ICD-10-CM

## 2022-12-15 ENCOUNTER — Encounter: Payer: Self-pay | Admitting: Cardiology

## 2022-12-18 ENCOUNTER — Encounter: Payer: Self-pay | Admitting: Cardiology

## 2022-12-18 NOTE — Progress Notes (Unsigned)
Primary Physician/Referring:  [name], MD  Patient ID: Chad Lawrence, male    DOB: [birth year], 72 y.o.   MRN: [id number]  No chief complaint on file.  HPI:    CYLER KAPPES  is a 72 y.o. 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 Servando Snare.  He also has paroxysmal atrial fibrillation and failed Multaq and presently on amiodarone.    This is a 74-monthoffice visit.  Is now been scheduled for colonoscopy.  Remains asymptomatic and denies chest pain, palpitations, dyspnea.    Past Medical History:  Diagnosis Date   Aortic regurgitation    Arrhythmia    Ascending aorta dilatation (HCC)    Atrial fibrillation ([place]    Hyperlipidemia    Pneumonia    Past Surgical History:  Procedure Laterality Date   AORTIC VALVE REPLACEMENT     BENTALL PROCEDURE N/A 09/20/2015   Procedure: BENTALL PROCEDURE;  Surgeon: EGrace Isaac MD;  [place]  Service: Open Heart Surgery;  Laterality: N/A;   CARDIAC CATHETERIZATION N/A 09/12/2015   Procedure: Left Heart Cath and Coronary Angiography;  Surgeon: JAdrian Prows MD;  [facility];  Service: Cardiovascular;  Laterality: N/A;   CARDIOVERSION N/A 07/03/2021   Procedure: CARDIOVERSION;  Surgeon: TRex Kras DO;  [place];  Service: Cardiovascular;  Laterality: N/A;   CARDIOVERSION N/A 01/29/2022   Procedure: CARDIOVERSION;  Surgeon: GAdrian Prows MD;  [place]  Service: Cardiovascular;  Laterality: N/A;   CLIPPING OF ATRIAL APPENDAGE N/A 09/20/2015   Procedure: CLIPPING OF ATRIAL APPENDAGE;  Surgeon: EGrace Isaac MD;  [place]  Service: Open Heart Surgery;  Laterality: N/A;   COLONOSCOPY     x 2   HERNIA REPAIR     MAZE N/A 09/20/2015    Procedure: MAZE;  Surgeon: EGrace Isaac MD;  [place]  Service: Open Heart Surgery;  Laterality: N/A;   TEE WITHOUT CARDIOVERSION N/A 09/20/2015   Procedure: TRANSESOPHAGEAL ECHOCARDIOGRAM (TEE);  Surgeon: EGrace Isaac MD;  [place]  Service: Open Heart Surgery;  Laterality: N/A;   Family History  Problem Relation Age of Onset   Hypertension Mother    Breast cancer Mother    Atrial fibrillation Mother    Emphysema Father    Aneurysm Father        abdominal aortic aneurysm   COPD Father    Social History   Tobacco Use   Smoking status: Never   Smokeless tobacco: Never  Substance Use Topics   Alcohol use: Yes    Alcohol/week: 0.0 standard drinks of alcohol    Comment: occasional   Marital Status: Married  ROS  Review of Systems  Cardiovascular:  Negative for chest pain, dyspnea on exertion and leg swelling.    Objective      03/19/2022    8:52 AM 02/19/2022    2:46 PM 02/08/2022    9:00 AM  Vitals with BMI  Height '6\' 0"'$  '6\' 0"'$  '6\' 0"'$   Weight 191 lbs 6 oz 191 lbs 195 lbs  BMI 25.95 273.5232.99 Systolic [id number] Diastolic 75 74 76  Pulse 84 82 60    There were no vitals taken for this visit. There is no height or weight on  file to calculate BMI.  Physical Exam Neck:     Vascular: Carotid bruit (left) present. No JVD.  Cardiovascular:     Rate and Rhythm: Normal rate and regular rhythm.     Pulses: Intact distal pulses.     Heart sounds: Murmur heard.     Harsh systolic murmur is present with a grade of 2/6 at the upper right sternal border radiating to the neck.     No gallop.  Pulmonary:     Effort: Pulmonary effort is normal.     Breath sounds: Normal breath sounds.  Abdominal:     General: Bowel sounds are normal.     Palpations: Abdomen is soft.  Musculoskeletal:     Right lower leg: No edema.     Left lower leg: No edema.     Laboratory examination:      Latest Ref Rng & Units 01/25/2022    9:25 AM 01/23/2022    9:36 AM  06/27/2021    9:43 AM  CMP  Glucose 70 - 99 mg/dL 95  102  118   BUN 8 - 27 mg/dL '13  21  13   '$ Creatinine 0.76 - 1.27 mg/dL 1.26  1.41  1.16   Sodium 134 - 144 mmol/L 142  142  143   Potassium 3.5 - 5.2 mmol/L 5.1  4.8  5.1   Chloride 96 - 106 mmol/L 106  106  105   CO2 20 - 29 mmol/L '22  23  23   '$ Calcium 8.6 - 10.2 mg/dL 9.4  9.2  9.5       Latest Ref Rng & Units 01/23/2022    9:36 AM 06/27/2021    9:43 AM 09/25/2015    4:15 AM  CBC  WBC 3.4 - 10.8 x10E3/uL 5.1  5.1  11.0   Hemoglobin 13.0 - 17.7 g/dL 15.8  16.4  10.3   Hematocrit 37.5 - 51.0 % 46.3  48.8  31.5   Platelets 150 - 450 x10E3/uL 205  237  207    Lipid Panel  No results found for: "CHOL", "TRIG", "HDL", "CHOLHDL", "VLDL", "LDLCALC", "LDLDIRECT" HEMOGLOBIN A1C Lab Results  Component Value Date   HGBA1C 5.8 (H) 09/18/2015   MPG 120 09/18/2015   No results found for: "TSH"   External labs:  10/29/2021: Total cholesterol 127, HDL 51, triglycerides 107, LDL 57 BUN 14, creatinine 0.98, GFR >60, sodium 139, potassium 4.9 TSH 2.90 Hgb 16, HCT 46.8, MCV 93.6, platelet 226  04/07/2019: HB 15.2/HCT 44.7, platelets 200.  Potassium 4.8, BUN 14, creatinine 1.0, eGFR greater than 60 minutes, total cholesterol 118, triglycerides 80, HDL 51, LDL 67.  PSA normal.  Labs 10/13/2018: HB 15.4/HCT 46.0, platelets 263.  BUN 12, creatinine 1.05, eGFR greater than 60 him a, CMP otherwise normal.  Potassium 4.7.  Total cholesterol 117, triglycerides 102, HDL 45, LDL 52.  Allergies  No Known Allergies   Final Medications at End of Visit     Current Outpatient Medications:    amiodarone (PACERONE) 200 MG tablet, Take 1 tablet (200 mg total) by mouth daily., Disp: 90 tablet, Rfl: 1   apixaban (ELIQUIS) 5 MG TABS tablet, Take 1 tablet (5 mg total) by mouth 2 (two) times daily., Disp: 180 tablet, Rfl: 3   fluticasone (FLONASE) 50 MCG/ACT nasal spray, Place into both nostrils daily., Disp: , Rfl:    Investigational - Study Medication,  Take 1 tablet by mouth in the morning and at bedtime. Study name: Griffith Citron Cecille Rubin -  factor XIa inhibitor PO QD vs Apixaban PO BID in patients with A. Fib for stroke prevention.) Additional study details: Treat study medication as you would Eliquis. Dr. Einar Gip, Disp: , Rfl:    metoprolol tartrate (LOPRESSOR) 25 MG tablet, Take 0.5 tablets (12.5 mg total) by mouth 2 (two) times daily., Disp: 180 tablet, Rfl: 3   rosuvastatin (CRESTOR) 10 MG tablet, TAKE 1 TABLET DAILY, Disp: 90 tablet, Rfl: 3   Radiology:    No results found.   Cardiac Studies:   Bentall Procedure 09/20/15: ascending aortic aneurysm repair with 30 mm [employer] Valsalva graft and [employer] pericardial tissue valve 25 mm model 3300 TFX  Carotid artery duplex  09/06/2019: Minimal soft plaque noted in bilateral carotid arteries without hemodynamically significant stenosis. Right vertebral artery flow is not visualized. Antegrade left vertebral artery flow. Follow up studies when clinically indicated.  CT angiogram chest 09/20/2019:  Redemonstration of surgical changes of Bentall procedure and left atrial clipping Native coronary artery atherosclerosis. Aortic Atherosclerosis (ICD10-I70.0). Centrilobular emphysema.  Emphysema (ICD10-J43.9). 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  PCV ECHOCARDIOGRAM COMPLETE 30/86/5784 Normal LV systolic function with visual EF 55-60%. Left ventricle cavity is normal in size. Abnormal septal wall motion due to post-operative valve. Mild left ventricular hypertrophy. Normal global wall motion. Unable to evaluate diastolic function due to atrial fibrillation. Normal LAP. S/p [facility] pericardial tissue valve 25 mm with normal functioning. No valvular or paravalvular regurgitation. Mild (Grade I) mitral regurgitation. Mild pulmonic regurgitation. Compared to study dated 09/18/2020: no significant change.  Mobile cardiac telemetry 14 days 6/14 -  05/15/2021: Dominant rhythm: Atrial fibrillation A. fib present: 100% Heart rate 40-187 bpm, average heart rate 79 bpm 0 episodes, 0% isolated SVE 5 episodes of VT, fastest 187 bpm for 6 beats, longest for 7 beats at 126 bpm 1.9% isolated VE, less than 1 couplet/triplet 100% A. fib burden seen. 1 pause in A. fib with R-R duration of 3 seconds noted.  Sinus pause greater than 3 seconds noted at 12:36 PM no associated symptoms No atrial flutter, SVT, high-grade AV block seen. 1 patient triggered event correlated with A. fib 80-90 bpm.  Direct current cardioversion 07/03/2021 Indications:  Atrial Fibrillation Cardioverted 1 time(s).  Cardioversion with synchronized biphasic 200J shock. Post procedure EKG shows:  sinus bradycardia  Direct current cardioversion 01/29/2022  Indication symptomatic A. Fibrillation.  Procedure: Using 100 mg of IV Propofol and 60 IV Lidocaine (for reducing venous pain) for achieving deep sedation, synchronized direct current cardioversion performed. Patient was delivered with 120 Joules of electricity X 1 with success to NSR. Patient tolerated the procedure well. No immediate complication noted.   EKG  03/19/2022: Sinus rhythm with single PAC at a rate of 60 bpm.  02/19/2022: Atrial fibrillation with controlled ventricular response at a rate of 79 bpm.  Assessment     ICD-10-CM   1. Paroxysmal atrial fibrillation (HCC)  I48.0     2. S/P AVR 25 mm Pericardial tissue valve 09/20/2015  Z95.2     3. S/P ascending aortic replacement  Z95.828       No orders of the defined types were placed in this encounter.  There are no discontinued medications.  This patients CHA2DS2-VASc Score 2 (vasc, Age) and yearly risk of stroke 2.2%.   Recommendations:   JASMINE MACEACHERN  is a 72 y.o. Caucasian male with history of thoracic aneurysm S/P Bentall procedure with replacement of aortic valve with a 25 mm pericardial tissue  valve LAA ligation and modified maze procedure,  ascending aortic aneurysm repair and reimplantation of his coronary arteries on 09/20/2015 by Dr. Servando Snare.  He also has paroxysmal atrial fibrillation and failed Multaq and presently on amiodarone.    1. Paroxysmal atrial fibrillation (HCC) ***  2. S/P AVR 25 mm Pericardial tissue valve 09/20/2015 ***  3. S/P ascending aortic replacement ***     Adrian Prows, MD, [facility] 12/18/2022, 10:44 PM Office: [phone] Fax: [phone] Pager: [phone]

## 2022-12-19 ENCOUNTER — Ambulatory Visit: Payer: Medicare Other | Admitting: Cardiology

## 2022-12-19 ENCOUNTER — Encounter: Payer: Self-pay | Admitting: Cardiology

## 2022-12-19 VITALS — BP 111/66 | HR 60 | Ht 72.0 in | Wt 192.8 lb

## 2022-12-19 DIAGNOSIS — I480 Paroxysmal atrial fibrillation: Secondary | ICD-10-CM

## 2022-12-19 DIAGNOSIS — Z95828 Presence of other vascular implants and grafts: Secondary | ICD-10-CM

## 2022-12-19 DIAGNOSIS — Z952 Presence of prosthetic heart valve: Secondary | ICD-10-CM

## 2022-12-19 DIAGNOSIS — G453 Amaurosis fugax: Secondary | ICD-10-CM

## 2022-12-27 ENCOUNTER — Ambulatory Visit: Payer: Medicare Other

## 2022-12-27 DIAGNOSIS — G453 Amaurosis fugax: Secondary | ICD-10-CM

## 2022-12-31 ENCOUNTER — Encounter: Payer: Self-pay | Admitting: Internal Medicine

## 2023-01-01 ENCOUNTER — Other Ambulatory Visit: Payer: Self-pay | Admitting: Internal Medicine

## 2023-01-01 DIAGNOSIS — D446 Neoplasm of uncertain behavior of carotid body: Secondary | ICD-10-CM

## 2023-01-07 ENCOUNTER — Ambulatory Visit
Admission: RE | Admit: 2023-01-07 | Discharge: 2023-01-07 | Disposition: A | Discharge status: Home / Self Care | Payer: Medicare Other | Source: Ambulatory Visit | Attending: Internal Medicine | Admitting: Internal Medicine

## 2023-01-07 DIAGNOSIS — D446 Neoplasm of uncertain behavior of carotid body: Secondary | ICD-10-CM

## 2023-01-07 MED ORDER — IOPAMIDOL (ISOVUE-300) INJECTION 61%
75.0000 mL | Freq: Once | INTRAVENOUS | Status: AC | PRN
  Administered 2023-01-07: 75 mL via INTRAVENOUS

## 2023-03-06 ENCOUNTER — Other Ambulatory Visit: Payer: Self-pay | Admitting: Internal Medicine

## 2023-03-06 DIAGNOSIS — G454 Transient global amnesia: Secondary | ICD-10-CM

## 2023-03-06 DIAGNOSIS — R42 Dizziness and giddiness: Secondary | ICD-10-CM

## 2023-03-07 ENCOUNTER — Ambulatory Visit
Admission: RE | Admit: 2023-03-07 | Discharge: 2023-03-07 | Disposition: A | Discharge status: Home / Self Care | Payer: Medicare Other | Source: Ambulatory Visit | Attending: Internal Medicine | Admitting: Internal Medicine

## 2023-03-07 DIAGNOSIS — G454 Transient global amnesia: Secondary | ICD-10-CM

## 2023-03-07 DIAGNOSIS — R42 Dizziness and giddiness: Secondary | ICD-10-CM

## 2023-03-21 ENCOUNTER — Encounter: Payer: Self-pay | Admitting: Urology

## 2023-03-21 DIAGNOSIS — R972 Elevated prostate specific antigen [PSA]: Secondary | ICD-10-CM

## 2023-03-28 ENCOUNTER — Other Ambulatory Visit: Payer: Self-pay

## 2023-03-28 ENCOUNTER — Other Ambulatory Visit: Payer: Self-pay | Admitting: Cardiology

## 2023-03-28 MED ORDER — ROSUVASTATIN CALCIUM 10 MG PO TABS: ORAL_TABLET | ORAL | 0 refills | Status: DC

## 2023-05-12 ENCOUNTER — Other Ambulatory Visit: Payer: Medicare Other

## 2023-06-05 ENCOUNTER — Other Ambulatory Visit: Payer: Self-pay

## 2023-06-05 MED ORDER — METOPROLOL TARTRATE 25 MG PO TABS: 12.5000 mg | ORAL_TABLET | Freq: Two times a day (BID) | ORAL | 3 refills | Status: DC

## 2023-06-19 ENCOUNTER — Ambulatory Visit: Payer: Medicare Other | Admitting: Cardiology

## 2023-06-20 ENCOUNTER — Ambulatory Visit: Payer: Medicare Other | Admitting: Cardiology

## 2023-06-20 ENCOUNTER — Encounter: Payer: Self-pay | Admitting: Cardiology

## 2023-06-20 VITALS — BP 115/69 | HR 58 | Resp 16 | Ht 72.0 in | Wt 197.0 lb

## 2023-06-20 DIAGNOSIS — Z95828 Presence of other vascular implants and grafts: Secondary | ICD-10-CM

## 2023-06-20 DIAGNOSIS — I480 Paroxysmal atrial fibrillation: Secondary | ICD-10-CM

## 2023-06-20 DIAGNOSIS — Z952 Presence of prosthetic heart valve: Secondary | ICD-10-CM

## 2023-06-20 DIAGNOSIS — G43109 Migraine with aura, not intractable, without status migrainosus: Secondary | ICD-10-CM

## 2023-06-20 DIAGNOSIS — Z2989 Encounter for other specified prophylactic measures: Secondary | ICD-10-CM

## 2023-06-20 MED ORDER — AMIODARONE HCL 200 MG PO TABS
200.0000 mg | ORAL_TABLET | ORAL | Status: DC
Start: 2023-06-20 — End: 2023-06-25

## 2023-06-20 NOTE — Progress Notes (Unsigned)
Primary Physician/Referring:  Ralene Ok, MD  Patient ID: Chad Lawrence, male    DOB: 16-Apr-1951, 72 y.o.   MRN: [id number]  Chief Complaint  Patient presents with   Aortic valve replacement   amaurosis fugax   Paroxysmal atrial fibrillation    Follow-up    6 months   HPI:    Chad Lawrence  is a 72 y.o. 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 Tyrone Sage.  He also has paroxysmal atrial fibrillation and failed Multaq and presently on amiodarone.    This is a 66-month office visit.  On his last office visit he had complained of left eye visual disturbance which I thought was related to amaurosis fugax/TIA, carotid artery duplex did not reveal any significant abnormality.  He now presents here stating that he has noticed decreased exercise tolerance, low heart rate and blood pressure.  He continues to have diplopia which is now much more evident than visual loss.  States that it comes on suddenly.  States that symptoms are getting worse.  He has not had any neurologic deficits.  He was evaluated by ophthalmology and no amiodarone toxicity was evident.  Normal ophthalmologic examination per patient.  Past Medical History:  Diagnosis Date   Aortic regurgitation    Arrhythmia    Ascending aorta dilatation (HCC)    Atrial fibrillation (HCC)    Hyperlipidemia    Pneumonia    Past Surgical History:  Procedure Laterality Date   AORTIC VALVE REPLACEMENT     BENTALL PROCEDURE N/A 09/20/2015   Procedure: BENTALL PROCEDURE;  Surgeon: Delight Ovens, MD;  Location: [facility] OR;  Service: Open Heart Surgery;  Laterality: N/A;   CARDIAC CATHETERIZATION N/A 09/12/2015   Procedure: Left Heart Cath and Coronary Angiography;  Surgeon: Yates Decamp, MD;   Location: [facility] INVASIVE CV LAB;  Service: Cardiovascular;  Laterality: N/A;   CARDIOVERSION N/A 07/03/2021   Procedure: CARDIOVERSION;  Surgeon: Tessa Lerner, DO;  Location: [facility] ENDOSCOPY;  Service: Cardiovascular;  Laterality: N/A;   CARDIOVERSION N/A 01/29/2022   Procedure: CARDIOVERSION;  Surgeon: Yates Decamp, MD;  Location: [facility] ENDOSCOPY;  Service: Cardiovascular;  Laterality: N/A;   CATARACT EXTRACTION Bilateral 10/18/2021   CLIPPING OF ATRIAL APPENDAGE N/A 09/20/2015   Procedure: CLIPPING OF ATRIAL APPENDAGE;  Surgeon: Delight Ovens, MD;  Location: [facility] OR;  Service: Open Heart Surgery;  Laterality: N/A;   COLONOSCOPY     x 2   HERNIA REPAIR     MAZE N/A 09/20/2015   Procedure: MAZE;  Surgeon: Delight Ovens, MD;  Location: [facility] OR;  Service: Open Heart Surgery;  Laterality: N/A;   TEE WITHOUT CARDIOVERSION N/A 09/20/2015   Procedure: TRANSESOPHAGEAL ECHOCARDIOGRAM (TEE);  Surgeon: Delight Ovens, MD;  Location: [facility] OR;  Service: Open Heart Surgery;  Laterality: N/A;   Family History  Problem Relation Age of Onset   Hypertension Mother    Breast cancer Mother    Atrial fibrillation Mother    Emphysema Father    Aneurysm Father        abdominal aortic aneurysm   COPD Father    Social History   Tobacco Use   Smoking status: Never   Smokeless tobacco: Never  Substance Use Topics   Alcohol use: Yes    Alcohol/week: 0.0 standard drinks  of alcohol    Comment: occasional   Marital Status: Married  ROS  Review of Systems  Eyes:  Positive for visual disturbance.  Cardiovascular:  Negative for chest pain, dyspnea on exertion and leg swelling.    Objective      06/20/2023    2:42 PM 12/19/2022   10:25 AM 03/19/2022    8:52 AM  Vitals with BMI  Height 6\' 0"  6\' 0"  6\' 0"   Weight 197 lbs 192 lbs 13 oz 191 lbs 6 oz  BMI 26.71 26.14 25.95  Systolic 115 111 161  Diastolic 69 66 75  Pulse 58 60 84    Blood pressure 115/69, pulse (!) 58, resp. rate 16, height 6' (1.829 m),  weight 197 lb (89.4 kg), SpO2 98%. Body mass index is 26.72 kg/m.  Physical Exam Neck:     Vascular: Carotid bruit (left) present. No JVD.  Cardiovascular:     Rate and Rhythm: Normal rate and regular rhythm.     Pulses: Intact distal pulses.     Heart sounds: Murmur heard.     Harsh systolic murmur is present with a grade of 2/6 at the upper right sternal border radiating to the neck.     No gallop.  Pulmonary:     Effort: Pulmonary effort is normal.     Breath sounds: Normal breath sounds.  Abdominal:     General: Bowel sounds are normal.     Palpations: Abdomen is soft.  Musculoskeletal:     Right lower leg: No edema.     Left lower leg: No edema.    Laboratory examination:   Lab Results  Component Value Date   NA 142 01/25/2022   K 5.1 01/25/2022   CO2 22 01/25/2022   GLUCOSE 95 01/25/2022   BUN 13 01/25/2022   CREATININE 1.26 01/25/2022   CALCIUM 9.4 01/25/2022   EGFR 61 01/25/2022   GFRNONAA >60 09/25/2015    External labs:   Labs 10/23/2022:  Serum glucose 91 mg, sodium 140, potassium 4.5, BUN 24, creatinine 1.2, EGFR 63.9 mm, LFTs normal.  Total cholesterol 135, triglycerides 11/18/2008, HDL 56, LDL 58.  TSH normal at 0.72.  A1c 6.2%  Hb 14.9/HCT 44.0, platelets 196.  Normal indicis.   Radiology:    CT neck and soft tissue 01/07/2023 1. 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 This has been imaged with ultrasound previously. 2. No carotid body mass.   Cardiac Studies:   Left Heart Catheterization 09/12/2015:   Bentall Procedure 09/20/15: ascending aortic aneurysm repair with 30 mm Gelweave Valsalva graft and [employer] pericardial tissue valve 25 mm model 3300 TFX  CT angiogram chest 09/20/2019:  Redemonstration of surgical changes of Bentall procedure and left atrial clipping Native coronary artery atherosclerosis. Aortic Atherosclerosis (ICD10-I70.0). Centrilobular emphysema.  Emphysema (ICD10-J43.9). The cystic  structure of the left mediastinum is no longer evident.  PCV ECHOCARDIOGRAM COMPLETE 05/10/2021 Normal LV systolic function with visual EF 55-60%. Left ventricle cavity is normal in size. Abnormal septal wall motion due to post-operative valve. Mild left ventricular hypertrophy. Normal global wall motion. Unable to evaluate diastolic function due to atrial fibrillation. Normal LAP. S/p [facility] pericardial tissue valve 25 mm with normal functioning. No valvular or paravalvular regurgitation. Mild (Grade I) mitral regurgitation. Mild pulmonic regurgitation. Compared to study dated 09/18/2020: no significant change.  Direct current cardioversion 07/03/2021 & 01/29/2022  Carotid artery duplex 12/27/2022: The bifurcation, internal, external and common carotid arteries reveal no evidence of  significant stenosis, bilaterally. Antegrade right vertebral artery flow. Antegrade left vertebral artery flow. 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 Recommend dedicated US of the neck to further delineate the structures better (see image attached). 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  EKG   EKG 06/20/2023: Normal sinus rhythm at the rate of 55 bpm, left anterior fascicular block.  Incomplete right bundle branch block.  Nonspecific T abnormality.  Compared to 12/19/2022, left atrial enlargement not well-appreciated.   02/19/2022: Atrial fibrillation with controlled ventricular response at a rate of 79 bpm.  Allergies   No Known Allergies   Current Outpatient Medications:    apixaban (ELIQUIS) 5 MG TABS tablet, Take 1 tablet (5 mg total) by mouth 2 (two) times daily., Disp: 180 tablet, Rfl: 3   rosuvastatin (CRESTOR) 10 MG tablet, TAKE 1 TABLET BY MOUTH DAILY, Disp: 90 tablet, Rfl: 0   amiodarone (PACERONE) 200 MG tablet, Take 0.5 tablets (100 mg total) by mouth as directed. 5 days a week M -F. None Sat-Sun, Disp: , Rfl:    Outpatient Medications Prior to Visit  Medication Sig Dispense Refill   apixaban (ELIQUIS) 5 MG TABS tablet Take 1 tablet (5 mg total) by mouth 2 (two) times daily. 180 tablet 3   rosuvastatin (CRESTOR) 10 MG tablet TAKE 1 TABLET BY MOUTH DAILY 90 tablet 0   amiodarone (PACERONE) 200 MG tablet Take 1 tablet (200 mg total) by mouth daily. (Patient taking differently: Take 100 mg by mouth daily.) 90 tablet 1   metoprolol tartrate (LOPRESSOR) 25 MG tablet Take 0.5 tablets (12.5 mg total) by mouth 2 (two) times daily. 30 tablet 3   fluticasone (FLONASE) 50 MCG/ACT nasal spray Place into both nostrils daily.     No facility-administered medications prior to visit.     Assessment     ICD-10-CM   1. Paroxysmal atrial fibrillation (HCC)  I48.0 EKG 12-Lead    PCV ECHOCARDIOGRAM COMPLETE    amiodarone (PACERONE) 200 MG tablet    DISCONTINUED: amiodarone (PACERONE) 200 MG tablet    2. S/P AVR 25 mm Pericardial tissue valve 09/20/2015  Z95.2 PCV ECHOCARDIOGRAM COMPLETE    3. S/P ascending aortic replacement  Z95.828 PCV ECHOCARDIOGRAM COMPLETE    4. Need for SBE (subacute bacterial endocarditis) prophylaxis  Z29.89     5. Ocular migraine  G43.109 Ambulatory referral to Neurology      Meds ordered this encounter  Medications   DISCONTD: amiodarone (PACERONE) 200 MG tablet    Sig: Take 1 tablet (200 mg total) by mouth as directed. 5 days a week M -F. None Sat-Sun   amiodarone (PACERONE) 200 MG tablet    Sig: Take 0.5 tablets (100 mg total) by mouth as directed. 5 days a week M -F. None Sat-Sun   Medications Discontinued During This Encounter  Medication Reason   fluticasone (FLONASE) 50 MCG/ACT nasal spray    metoprolol tartrate (LOPRESSOR) 25 MG tablet Discontinued by provider   amiodarone (PACERONE) 200 MG tablet    amiodarone (PACERONE) 200 MG tablet     This patients CHA2DS2-VASc Score 2 (vasc, Age) and yearly risk of stroke 2.2%.   Recommendations:   PAYTON PRINSEN  is a  72 y.o. Caucasian male with history of thoracic aneurysm S/P Bentall procedure with replacement of aortic valve with a 25 mm pericardial tissue valve LAA ligation and modified maze procedure, ascending aortic aneurysm repair and reimplantation of his coronary arteries on  09/20/2015, paroxysmal atrial fibrillation and failed Multaq and presently on amiodarone.    1. Paroxysmal atrial fibrillation (HCC) Patient with paroxysmal episode of atrial fibrillation, presently on appropriate anticoagulation however has noticed marked decreased exercise tolerance and fatigue.  He is bradycardic, blood pressure is also soft, we will reduce his amiodarone presently on 100 mg 7 days a week we will change it to 5 days a week.  - EKG 12-Lead - PCV ECHOCARDIOGRAM COMPLETE; Future - amiodarone (PACERONE) 200 MG tablet; Take 0.5 tablets (100 mg total) by mouth as directed. 5 days a week M -F. None Sat-Sun  2. S/P AVR 25 mm Pericardial tissue valve 09/20/2015 Aortic sclerotic murmur slightly more prominent, will repeat echocardiographic also review office decreased exercise tolerance as well.  Patient unfortunately had dental workup done few weeks ago and did not take endocarditis prophylaxis. - PCV ECHOCARDIOGRAM COMPLETE; Future  3. S/P ascending aortic replacement He has ascending aortic root replacement and aortic replacement, we will follow-up on his echocardiogram. - PCV ECHOCARDIOGRAM COMPLETE; Future  4. Need for SBE (subacute bacterial endocarditis) prophylaxis Patient recently had dental workup but unfortunately did not take antibiotics.  Advised him that he will need lifelong endocarditis prophylaxis and we will prosthetic aortic valve.  5. Ocular migraine On his last office visit he had complained of left visual loss but on further questioning, I initially thought that this was related to TIA-like symptoms however carotid artery duplex did not reveal any significant abnormality, on further questioning he  has diplopia that comes on occasionally.  I am beginning to wonder if this is ocular migraine.  I would like to have neurology give me an opinion regarding this.  I do not think this is TIA.  Patient will be in touch with me if his symptoms of fatigue or dyspnea does not improve, also if echocardiogram is abnormal obviously I will be in touch with him as well.  Otherwise would like to see him back in 6 months for follow-up.  - Ambulatory referral to Neurology     Yates Decamp, MD, [facility] 06/25/2023, 12:09 PM Office: [phone] Fax: [phone] Pager: [phone]

## 2023-06-21 ENCOUNTER — Ambulatory Visit
Admission: RE | Admit: 2023-06-21 | Discharge: 2023-06-21 | Disposition: A | Discharge status: Home / Self Care | Payer: Medicare Other | Source: Ambulatory Visit | Attending: Urology | Admitting: Urology

## 2023-06-21 DIAGNOSIS — R972 Elevated prostate specific antigen [PSA]: Secondary | ICD-10-CM

## 2023-06-21 MED ORDER — GADOPICLENOL 0.5 MMOL/ML IV SOLN
9.0000 mL | Freq: Once | INTRAVENOUS | Status: AC | PRN
  Administered 2023-06-21: 9 mL via INTRAVENOUS

## 2023-06-25 ENCOUNTER — Encounter: Payer: Self-pay | Admitting: Cardiology

## 2023-06-25 MED ORDER — AMIODARONE HCL 200 MG PO TABS
100.0000 mg | ORAL_TABLET | ORAL | Status: DC
Start: 2023-06-25 — End: 2023-10-10

## 2023-06-27 ENCOUNTER — Encounter: Payer: Self-pay | Admitting: Neurology

## 2023-07-08 ENCOUNTER — Ambulatory Visit: Payer: Medicare Other

## 2023-07-08 DIAGNOSIS — Z95828 Presence of other vascular implants and grafts: Secondary | ICD-10-CM

## 2023-07-08 DIAGNOSIS — Z952 Presence of prosthetic heart valve: Secondary | ICD-10-CM

## 2023-07-08 DIAGNOSIS — I480 Paroxysmal atrial fibrillation: Secondary | ICD-10-CM

## 2023-07-14 NOTE — Progress Notes (Signed)
Echocardiogram 07/08/2023: Left ventricle cavity is normal in size. Normal left ventricular wall thickness. Mild global hypokinesis. LVEF 40-45%. Normal global wall motion. Unable to evaluate diastolic function due to atrial fibrillation. Left atrial cavity is mildly dilated. Right ventricle cavity is mildly dilated. Low normal right ventricular function. Trileaflet aortic valve. Trace aortic regurgitation. Mild (Grade I) mitral regurgitation. Mild tricuspid regurgitation.  No evidence of pulmonary hypertension. No significant change compared to previous study on 03/06/2023.

## 2023-07-19 NOTE — Progress Notes (Signed)
Your echo is very good and do not see any change in your heart function. Be in touch if you are not feeling well. Hope you see the neurologist soon as well.  Vonna Kotyk

## 2023-07-23 NOTE — Progress Notes (Signed)
Called patient to inform him about his lab results. Patient understood

## 2023-09-19 ENCOUNTER — Telehealth: Payer: Self-pay | Admitting: Cardiology

## 2023-09-19 DIAGNOSIS — I480 Paroxysmal atrial fibrillation: Secondary | ICD-10-CM

## 2023-09-19 MED ORDER — APIXABAN 5 MG PO TABS
5.0000 mg | ORAL_TABLET | Freq: Two times a day (BID) | ORAL | 1 refills | Status: DC
Start: 2023-09-19 — End: 2024-04-08

## 2023-09-19 NOTE — Telephone Encounter (Signed)
?*  STAT* If patient is at the pharmacy, call can be transferred to refill team. ? ? ?1. Which medications need to be refilled? (please list name of each medication and dose if known)  ?apixaban (ELIQUIS) 5 MG TABS tablet ? ?2. Which pharmacy/location (including street and city if local pharmacy) is medication to be sent to? ?[facility] - [place], [place] - [address] ? ?3. Do they need a 30 day or 90 day supply?  ?90 day supply ? ?

## 2023-09-19 NOTE — Telephone Encounter (Signed)
Prescription refill request for Eliquis received. Indication:Afib  Last office visit: 06/20/23 Jacinto Halim)  Scr: 1.09 (08/29/23 via [facility])  Age: 72 Weight: 89.4kg  Appropriate dose. Refill sent.

## 2023-10-09 ENCOUNTER — Other Ambulatory Visit: Payer: Self-pay | Admitting: Cardiology

## 2023-10-09 DIAGNOSIS — I480 Paroxysmal atrial fibrillation: Secondary | ICD-10-CM

## 2023-10-20 ENCOUNTER — Telehealth: Payer: Self-pay | Admitting: Cardiology

## 2023-10-20 MED ORDER — ROSUVASTATIN CALCIUM 10 MG PO TABS: ORAL_TABLET | ORAL | 3 refills | Status: DC

## 2023-10-20 NOTE — Telephone Encounter (Signed)
Chart indicates  Amiodarone 200 mg -Take 0.5 tablets (100 mg total) by mouth as directed. 5 days a week M -F. None Sat-Sun . Patient reports he has been taking this dose but when he went to pharmacy and picked up his prescription it was for 100 mg with instructions to take 0.5 tablets on M-F.  He checked bottle and confirmed prescription is for 100 mg amiodarone tablets.  He will take 1 of the 100 mg tablets on M-F.   I called [facility] and they will correct prescription to amiodarone 200 mg --Take 0.5 tablets (100 mg total) by mouth as directed. 5 days a week M -F. None Sat-Sun  Patient made aware. [facility].

## 2023-10-20 NOTE — Telephone Encounter (Signed)
Pt c/o medication issue:  1. Name of Medication: amiodarone (PACERONE) 200 MG tablet   2. How are you currently taking this medication (dosage and times per day)?   3. Are you having a reaction (difficulty breathing--STAT)?   4. What is your medication issue? Patient is requesting call back to get clarification as to how he is to take this medication. Please advise.

## 2023-11-04 NOTE — Progress Notes (Unsigned)
NEUROLOGY CONSULTATION NOTE  Chad Lawrence MRN: [id number] DOB: [birth year]  Referring provider: Yates Decamp, MD Primary care provider: Ralene Ok, MD  Reason for consult:  migraine  Assessment/Plan:   Recurrent transient binocular diplopia.  Tends to occur with focusing/eye strain.  Would evaluate for vertebrobasilar insufficiency but less likely to be recurrent TIAs over the course of 2 years.  Unlikely to be migraine.  Would also evaluate for myasthenia gravis. He also reports feeling a little off balance when he is in the shower.  No evidence of any significant neuropathy in the feet and no evidence of myelopathy.  Likely age-related.   Check MRI and MRA of head Check myasthenia gravis panel, TSH, free T4 Further recommendations pending results.   Subjective:  Chad Lawrence is a 72 year old right-handed male with paroxysmal atrial fibrillation, HLD, ascending aortic aneurysm repair, and aortic regurgitation s/p AVR who presents for migraines.  History supplemented by referring provider's notes.  CT head personally reviewed.    Beginning in late 2022/early 2023, he began experiencing recurrent episodes of horizontal double vision.  The first time, it occurred, he was driving home from a long trip and had to pull over.  If he closed his left eye, vision was clear.  If he closed his right eye, it took a moment to return to focus.  It lasted about 10-15 minutes and resolved.  It happens when he is focusing on something rather than just at rest.  80% of the time it occurred while driving but it may also occur if he stands up to quickly, in which he may have associated dizziness.  Dizziness only occurs occasionally.  He had a very mild headache with the first episode, otherwise no associated headache.  No other focal neurologic deficits.  They typically last a few minutes.  His last episode occurred a couple of weeks ago and lasted just a few seconds.  He has had about 3 to 5  spells over the past 2 years.  When this first occurred, he saw his ophthalmologist who found nothing abnormal on exam, although he wasn't experiencing an episode at the time.  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  His atrial fibrillation has been controlled.   EKG on 06/20/2023 showed NSR of 55 bpm with left anterior fascicular block, incomplete RBB and nonspecific T abnormality.  2D echo on 07/08/2023 revealed LVEF 65% and s/p [employer] pericardial tissue valve without valvular or paravalvular regurgitation.    Denies history of migraines or headaches.  Of note, recently he notices when he stands in the shower washing his hair with his eyes closed, he feels off balance.  No dizziness.  Denies numbness and tingling in the feet/toes.  No falls.   PAST MEDICAL HISTORY: Past Medical History:  Diagnosis Date   Aortic regurgitation    Arrhythmia    Ascending aorta dilatation (HCC)    Atrial fibrillation (HCC)    Hyperlipidemia    Pneumonia     PAST SURGICAL HISTORY: Past Surgical History:  Procedure Laterality Date   AORTIC VALVE REPLACEMENT     BENTALL PROCEDURE N/A 09/20/2015   Procedure: BENTALL PROCEDURE;  Surgeon: Delight Ovens, MD;  Location: [facility] OR;  Service: Open Heart Surgery;  Laterality: N/A;  CARDIAC CATHETERIZATION N/A 09/12/2015   Procedure: Left Heart Cath and Coronary Angiography;  Surgeon: Yates Decamp, MD;  Location: [facility] INVASIVE CV LAB;  Service: Cardiovascular;  Laterality: N/A;   CARDIOVERSION N/A 07/03/2021   Procedure: CARDIOVERSION;  Surgeon: Tessa Lerner, DO;  Location: [facility] ENDOSCOPY;  Service: Cardiovascular;  Laterality: N/A;   CARDIOVERSION N/A 01/29/2022   Procedure: CARDIOVERSION;  Surgeon: Yates Decamp,  MD;  Location: [facility] ENDOSCOPY;  Service: Cardiovascular;  Laterality: N/A;   CATARACT EXTRACTION Bilateral 10/18/2021   CLIPPING OF ATRIAL APPENDAGE N/A 09/20/2015   Procedure: CLIPPING OF ATRIAL APPENDAGE;  Surgeon: Delight Ovens, MD;  Location: [facility] OR;  Service: Open Heart Surgery;  Laterality: N/A;   COLONOSCOPY     x 2   HERNIA REPAIR     MAZE N/A 09/20/2015   Procedure: MAZE;  Surgeon: Delight Ovens, MD;  Location: [facility] OR;  Service: Open Heart Surgery;  Laterality: N/A;   TEE WITHOUT CARDIOVERSION N/A 09/20/2015   Procedure: TRANSESOPHAGEAL ECHOCARDIOGRAM (TEE);  Surgeon: Delight Ovens, MD;  Location: [facility] OR;  Service: Open Heart Surgery;  Laterality: N/A;    MEDICATIONS: Current Outpatient Medications on File Prior to Visit  Medication Sig Dispense Refill   amiodarone (PACERONE) 200 MG tablet Take 0.5 tablets (100 mg total) by mouth as directed. 5 days a week M -F. None Sat-Sun 90 tablet 2   apixaban (ELIQUIS) 5 MG TABS tablet Take 1 tablet (5 mg total) by mouth 2 (two) times daily. 180 tablet 1   rosuvastatin (CRESTOR) 10 MG tablet TAKE 1 TABLET BY MOUTH DAILY 90 tablet 3   No current facility-administered medications on file prior to visit.    ALLERGIES: No Known Allergies  FAMILY HISTORY: Family History  Problem Relation Age of Onset   Hypertension Mother    Breast cancer Mother    Atrial fibrillation Mother    Emphysema Father    Aneurysm Father        abdominal aortic aneurysm   COPD Father     Objective:  Blood pressure 125/76, pulse 77, height 6' (1.829 m), weight 193 lb 6.4 oz (87.7 kg), SpO2 97%. General: No acute distress.  Patient appears well-groomed.   Head:  Normocephalic/atraumatic Eyes:  fundi examined but not visualized Heart: regular rate and rhythm Vascular: No carotid bruits. Neurological Exam: Mental status: alert and oriented to person, place, and time, speech fluent and not dysarthric, language intact. Cranial nerves: CN I: not  tested CN II: pupils equal, round and reactive to light, visual fields intact CN III, IV, VI:  full range of motion, no nystagmus, no ptosis CN V: facial sensation intact. CN VII: upper and lower face symmetric CN VIII: hearing intact CN IX, X: gag intact, uvula midline CN XI: sternocleidomastoid and trapezius muscles intact CN XII: tongue midline Bulk & Tone: normal, no fasciculations. Motor:  muscle strength 5/5 throughout Sensation:  Pinprick and vibratory sensation intact. Deep Tendon Reflexes:  2+ throughout,  toes downgoing.   Finger to nose testing:  Without dysmetria.   Heel to shin:  Without dysmetria.   Gait:  Normal station and stride.  Able to turn and tandem walk.  Romberg negative.    Thank you for allowing me to take part in the care of this patient.  Shon Millet, DO  CC:  Ralene Ok, MD  Yates Decamp, MD

## 2023-11-05 ENCOUNTER — Other Ambulatory Visit: Payer: Medicare Other

## 2023-11-05 ENCOUNTER — Ambulatory Visit (INDEPENDENT_AMBULATORY_CARE_PROVIDER_SITE_OTHER): Payer: Medicare Other | Admitting: Neurology

## 2023-11-05 ENCOUNTER — Encounter: Payer: Self-pay | Admitting: Neurology

## 2023-11-05 VITALS — BP 125/76 | HR 77 | Ht 72.0 in | Wt 193.4 lb

## 2023-11-05 DIAGNOSIS — H532 Diplopia: Secondary | ICD-10-CM | POA: Diagnosis not present

## 2023-11-05 NOTE — Patient Instructions (Addendum)
Check MRI and MRA of head. We have sent a referral to [facility] for your MRI and they will call you directly to schedule your appointment. They are located at [address] [facility]. If you need to contact them directly please call [phone].  Check Myasthenia gravis panel, TSH and free T4. Your provider has requested that you have labwork completed today. Please go to [facility] ([id number]) on the second floor of this building before leaving the office today. You do not need to check in. If you are not called within 15 minutes please check with the front desk.   Further recommendations pending results.

## 2023-11-07 ENCOUNTER — Telehealth: Payer: Self-pay | Admitting: Cardiology

## 2023-11-07 NOTE — Telephone Encounter (Signed)
*  STAT* If patient is at the pharmacy, call can be transferred to refill team.   1. Which medications need to be refilled? (please list name of each medication and dose if known) amiodarone (PACERONE) 200 MG tablet   2. Which pharmacy/location (including street and city if local pharmacy) is medication to be sent to? [facility] - [place], [place] - [address]   3. Do they need a 30 day or 90 day supply? 90

## 2023-11-11 ENCOUNTER — Other Ambulatory Visit: Payer: Self-pay

## 2023-11-11 DIAGNOSIS — I480 Paroxysmal atrial fibrillation: Secondary | ICD-10-CM

## 2023-11-11 MED ORDER — AMIODARONE HCL 200 MG PO TABS: ORAL_TABLET | ORAL | 2 refills | Status: DC

## 2023-11-17 LAB — MYASTHENIA GRAVIS PANEL 2 W/ RFLX MUSK ANTIBODY
A CHR BINDING ABS: <0.3 nmol/L
ACHR Blocking Abs: <15 %{inhibition} (ref <15)
Acetylchol Modul Ab: <1 %{inhibition}

## 2023-11-17 LAB — MUSK ANTIBODY TEST
Interpretation: NEGATIVE
Technical Results: <1:10 {titer}

## 2023-11-17 LAB — TSH+FREE T4: TSH W/REFLEX TO FT4: 0.87 m[IU]/L (ref 0.40–4.50)

## 2023-11-29 ENCOUNTER — Other Ambulatory Visit: Payer: Medicare Other

## 2023-12-22 ENCOUNTER — Ambulatory Visit: Payer: Self-pay | Admitting: Cardiology

## 2024-01-04 ENCOUNTER — Ambulatory Visit
Admission: RE | Admit: 2024-01-04 | Discharge: 2024-01-04 | Disposition: A | Discharge status: Home / Self Care | Payer: Medicare Other | Source: Ambulatory Visit | Attending: Neurology | Admitting: Neurology

## 2024-01-04 ENCOUNTER — Ambulatory Visit
Admission: RE | Admit: 2024-01-04 | Discharge: 2024-01-04 | Disposition: A | Discharge status: Home / Self Care | Payer: Medicare Other | Source: Ambulatory Visit | Attending: Neurology

## 2024-01-04 DIAGNOSIS — H532 Diplopia: Secondary | ICD-10-CM

## 2024-01-08 NOTE — Progress Notes (Unsigned)
 Cardiology Office Note:  .   Date:  01/09/2024  ID:  Lawrence, Chad 1951-04-07, MRN [id number] PCP: Ralene Ok, MD  [facility] Providers Cardiologist:  None   History of Present Illness: .   Chad Lawrence is a 73 y.o. 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 Tyrone Lawrence. He also has paroxysmal atrial fibrillation and failed Multaq and presently on amiodarone.   Discussed the use of AI scribe software for clinical note transcription with the patient, who gave verbal consent to proceed.  History of Present Illness   The patient, with a history of paroxysmal atrial fibrillation and aortic valve replacement, reports maintaining regular rhythm with no AFib. The patient's main concern is occasional episodes of visual disturbances, primarily in the left eye. These episodes, described as feeling cross-eyed and lasting less than a minute, have occurred three to four times in the past month. The patient has undergone an MRI and ophthalmologist consultation, both of which were normal. The patient also reports a recent increase in blood pressure, with readings in the 130s, higher than his usual range of 117-120. The patient is currently on amiodarone five days a week and reports feeling well on this medication.      Labs   External Labs:  NA  Review of Systems  Eyes:  Positive for visual disturbance.  Cardiovascular:  Negative for chest pain, dyspnea on exertion and leg swelling.   Physical Exam:   VS:  BP 136/84 (BP Location: Left Arm, Patient Position: Sitting, Cuff Size: Normal)   Pulse 68   Resp 16   Ht 6' (1.829 m)   Wt 192 lb 3.2 oz (87.2 kg)   SpO2 96%   BMI 26.07 kg/m    Wt Readings from Last 3 Encounters:  01/09/24 192 lb  3.2 oz (87.2 kg)  11/05/23 193 lb 6.4 oz (87.7 kg)  06/20/23 197 lb (89.4 kg)    Physical Exam Neck:     Vascular: No JVD.  Cardiovascular:     Rate and Rhythm: Normal rate and regular rhythm.     Pulses: Intact distal pulses.     Heart sounds: S1 normal and S2 normal. Murmur heard.     Early systolic murmur is present with a grade of 2/6 at the upper right sternal border.     No gallop.  Pulmonary:     Effort: Pulmonary effort is normal.     Breath sounds: Normal breath sounds.  Abdominal:     General: Bowel sounds are normal.     Palpations: Abdomen is soft.  Musculoskeletal:     Right lower leg: No edema.     Left lower leg: No edema.    Studies Reviewed: Marland Kitchen    ECHOCARDIOGRAM COMPLETE 07/08/2023 Left ventricle cavity is normal in size. Normal left ventricular wall thickness. Mild global hypokinesis. LVEF 40-45%. Normal global wall motion. Unable to evaluate diastolic function due to atrial fibrillation. Left atrial cavity is mildly dilated. Right ventricle cavity is mildly dilated. Low normal right ventricular function. Trileaflet aortic valve. Trace aortic regurgitation. Mild (Grade I) mitral regurgitation. Mild tricuspid regurgitation. No evidence of pulmonary hypertension. No significant change compared to previous study on 03/06/2023.  Direct current cardioversion 07/03/2021 & 01/29/2022  EKG:    EKG Interpretation Date/Time:  Friday  January 09 2024 08:22:55 EST Ventricular Rate:  76 PR Interval:  164 QRS Duration:  82 QT Interval:  396 QTC Calculation: 445 R Axis:   -69  Text Interpretation: EKG 01/09/2024: Normal sinus rhythm at rate of 76 bpm, left anterior fascicular block.  Poor R progression, cannot exclude anteroseptal infarct old. Compared to 06/20/2023, 01/29/2022, no significant change. Confirmed by Chad Lawrence [phone]) on 01/09/2024 8:35:48 AM    Medications and allergies     Current Outpatient Medications:    amiodarone (PACERONE) 200 MG tablet,  Take 0.5 tablets (100 mg total) by mouth as directed. 5 days a week M -F. None Sat-Sun, Disp: 45 tablet, Rfl: 2   apixaban (ELIQUIS) 5 MG TABS tablet, Take 1 tablet (5 mg total) by mouth 2 (two) times daily., Disp: 180 tablet, Rfl: 1   rosuvastatin (CRESTOR) 10 MG tablet, TAKE 1 TABLET BY MOUTH DAILY, Disp: 90 tablet, Rfl: 3   ASSESSMENT AND PLAN: .      ICD-10-CM   1. Paroxysmal atrial fibrillation (HCC)  I48.0 EKG 12-Lead    ECHOCARDIOGRAM COMPLETE    2. Non-ischemic cardiomyopathy (HCC)  I42.8 ECHOCARDIOGRAM COMPLETE    3. 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 Pre-hypertension  R03.0 ECHOCARDIOGRAM COMPLETE    5. Ocular migraine  G43.109 EKG 12-Lead    ECHOCARDIOGRAM COMPLETE      Assessment and Plan    Paroxysmal Atrial Fibrillation (PAF) Regular rhythm is maintained with no AFib episodes. Amiodarone 100 mg five days a week is effective with no significant side effects. The low dose presents a low risk of side effects. Continue amiodarone and monitor for side effects or recurrence of AFib.  Non-Ischemic Cardiomyopathy Heart function was mildly reduced in August 2024 due to AFib, with no coronary artery disease. A 2024 echocardiogram showed well-managed heart function. Repeat the echocardiogram to assess current heart function now that regular rhythm is maintained.  Aortic Valve Replacement The aortic valve, replaced in 2016, is functioning well with a mild tricuspid valve leak, which is not clinically significant. Asymptomatic. Continue routine monitoring of valve function.  Prehypertension Recent blood pressure readings have been higher than usual, initially around 150/80 mmHg, later around 130/80 mmHg. Historically, blood pressure ranged from 117/60 to 120/70 mmHg. Discussed potential benefits of starting losartan for protective effects on the aorta, kidneys, and reducing AFib onset. Prefers to monitor blood  pressure at home before starting medication. Monitor blood pressure at home and consider starting losartan if it remains >130/80 mmHg.  Intermittent Visual Disturbances Occasional visual disturbances occur, primarily in the left eye, sometimes in the right, lasting less than a minute and disorienting. MRI and ophthalmology evaluation were normal. Ocular migraine is a differential diagnosis, but the neurologist does not consider it the cause. The condition is not life-threatening but could be life-altering. Follow up with neurology as needed.  General Health Maintenance Reports feeling good overall and exercises regularly, averaging about 11,000 steps a day. Recent annual physical was completed. Continue regular exercise and review lab results from the recent physical when available.  Follow-up Schedule a follow-up appointment in 1 year.   He needs annual TSH and LFTs due to being on amiodarone, labs are being done at PCPs office.   Signed,  Yates Decamp, MD, [facility] 01/09/2024, 10:15 AM [facility] [address] [place], [place] [address] Phone: [phone]. Fax:  [phone]

## 2024-01-09 ENCOUNTER — Encounter: Payer: Self-pay | Admitting: Cardiology

## 2024-01-09 ENCOUNTER — Ambulatory Visit: Payer: Medicare Other | Attending: Cardiology | Admitting: Cardiology

## 2024-01-09 VITALS — BP 136/84 | HR 68 | Resp 16 | Ht 72.0 in | Wt 192.2 lb

## 2024-01-09 DIAGNOSIS — Z952 Presence of prosthetic heart valve: Secondary | ICD-10-CM | POA: Diagnosis present

## 2024-01-09 DIAGNOSIS — G43109 Migraine with aura, not intractable, without status migrainosus: Secondary | ICD-10-CM | POA: Diagnosis present

## 2024-01-09 DIAGNOSIS — I428 Other cardiomyopathies: Secondary | ICD-10-CM | POA: Diagnosis not present

## 2024-01-09 DIAGNOSIS — I480 Paroxysmal atrial fibrillation: Secondary | ICD-10-CM | POA: Insufficient documentation

## 2024-01-09 DIAGNOSIS — R030 Elevated blood-pressure reading, without diagnosis of hypertension: Secondary | ICD-10-CM | POA: Diagnosis present

## 2024-01-09 LAB — LAB REPORT - SCANNED
A1c: 6
EGFR: 73

## 2024-01-09 NOTE — Patient Instructions (Signed)
 Medication Instructions:   *If you need a refill on your cardiac medications before your next appointment, please call your pharmacy*   Lab Work:  If you have labs (blood work) drawn today and your tests are completely normal, you will receive your results only by: MyChart Message (if you have MyChart) OR A paper copy in the mail If you have any lab test that is abnormal or we need to change your treatment, we will call you to review the results.   Testing/Procedures: Your physician has requested that you have an echocardiogram. Echocardiography is a painless test that uses sound waves to create images of your heart. It provides your doctor with information about the size and shape of your heart and how well your heart's chambers and valves are working. This procedure takes approximately one hour. There are no restrictions for this procedure. Please do NOT wear cologne, perfume, aftershave, or lotions (deodorant is allowed). Please arrive 15 minutes prior to your appointment time.  Please note: We ask at that you not bring children with you during ultrasound (echo/ vascular) testing. Due to room size and safety concerns, children are not allowed in the ultrasound rooms during exams. Our front office staff cannot provide observation of children in our lobby area while testing is being conducted. An adult accompanying a patient to their appointment will only be allowed in the ultrasound room at the discretion of the ultrasound technician under special circumstances. We apologize for any inconvenience.    Follow-Up: At [facility], you and your health needs are our priority.  As part of our continuing mission to provide you with exceptional heart care, we have created designated Provider Care Teams.  These Care Teams include your primary Cardiologist (physician) and Advanced Practice Providers (APPs -  Physician Assistants and Nurse Practitioners) who all work together to provide you  with the care you need, when you need it.  We recommend signing up for the patient portal called "MyChart".  Sign up information is provided on this After Visit Summary.  MyChart is used to connect with patients for Virtual Visits (Telemedicine).  Patients are able to view lab/test results, encounter notes, upcoming appointments, etc.  Non-urgent messages can be sent to your provider as well.   To learn more about what you can do with MyChart, go to [email or web address].    Your next appointment:   One year with Dr Jacinto Halim

## 2024-01-12 NOTE — Progress Notes (Signed)
 Patient advised of his results. Patient wants to think about it before doing the MRI with and without Contrast focusing on the Pituitary Gland

## 2024-01-13 ENCOUNTER — Encounter: Payer: Self-pay | Admitting: Cardiology

## 2024-01-13 NOTE — Progress Notes (Signed)
 Labs 01/08/2024:  Total cholesterol 11/26/2025, triglycerides 94, HDL 57, LDL 52.  BUN 18, creatinine 1.07, EGFR 73 mL, potassium 4.9.  LFTs normal.  A1c 6.0%.  Hb 15.4/HCT 45.8, platelets 217.

## 2024-02-02 ENCOUNTER — Encounter (HOSPITAL_COMMUNITY): Payer: Self-pay

## 2024-02-02 ENCOUNTER — Encounter: Payer: Self-pay | Admitting: Cardiology

## 2024-02-02 ENCOUNTER — Ambulatory Visit (HOSPITAL_COMMUNITY): Payer: Medicare Other | Attending: Cardiology

## 2024-02-02 DIAGNOSIS — I480 Paroxysmal atrial fibrillation: Secondary | ICD-10-CM | POA: Diagnosis not present

## 2024-02-02 DIAGNOSIS — G43109 Migraine with aura, not intractable, without status migrainosus: Secondary | ICD-10-CM | POA: Diagnosis present

## 2024-02-02 DIAGNOSIS — I428 Other cardiomyopathies: Secondary | ICD-10-CM

## 2024-02-02 DIAGNOSIS — R030 Elevated blood-pressure reading, without diagnosis of hypertension: Secondary | ICD-10-CM

## 2024-02-02 DIAGNOSIS — Z952 Presence of prosthetic heart valve: Secondary | ICD-10-CM

## 2024-02-02 LAB — ECHOCARDIOGRAM COMPLETE
AV Mean grad: 9 mmHg
AV Peak grad: 15.8 mmHg
Ao pk vel: 1.99 m/s
Area-P 1/2: 3.13 cm2
S' Lateral: 2.8 cm

## 2024-02-02 NOTE — Progress Notes (Signed)
 Normal heart function, prosthetic aortic valve functioning normally.  Ascending aorta is minimally dilated, probably normal variant for his age and weight and height and history of aortic valve replacement.  Overall stable echocardiogram compared to previous.

## 2024-04-08 ENCOUNTER — Other Ambulatory Visit: Payer: Self-pay | Admitting: Cardiology

## 2024-04-08 DIAGNOSIS — I480 Paroxysmal atrial fibrillation: Secondary | ICD-10-CM

## 2024-04-08 NOTE — Telephone Encounter (Signed)
 Prescription refill request for Eliquis  received. Indication: a fib Last office visit: 01/09/24 Scr: 1.07 epic 01/08/24 Age: 73 Weight: 87kg

## 2024-07-27 ENCOUNTER — Other Ambulatory Visit: Payer: Self-pay | Admitting: Cardiology

## 2024-07-27 DIAGNOSIS — I480 Paroxysmal atrial fibrillation: Secondary | ICD-10-CM

## 2024-07-28 NOTE — Telephone Encounter (Signed)
 Prescription refill request for Eliquis  received. Indication: PAF Last office visit: 01/09/24  JINNY Bergamo MD Scr: 1.07 on 01/08/24  Epic Age: 73 Weight: 87.2kg  Based on above findings Eliquis  5mg  twice daily is the appropriate dose.  Refill approved.

## 2024-10-14 ENCOUNTER — Other Ambulatory Visit: Payer: Self-pay | Admitting: Cardiology

## 2024-11-02 ENCOUNTER — Other Ambulatory Visit: Payer: Self-pay

## 2024-11-02 DIAGNOSIS — I480 Paroxysmal atrial fibrillation: Secondary | ICD-10-CM

## 2024-11-03 MED ORDER — AMIODARONE HCL 200 MG PO TABS: ORAL_TABLET | ORAL | 0 refills | Status: AC

## 2024-11-26 NOTE — Progress Notes (Unsigned)
 "  NEUROLOGY FOLLOW UP OFFICE NOTE  Chad Lawrence [id number]  Assessment/Plan:   Recurrent transient binocular diplopia.  Tends to occur with focusing/eye strain.  Myasthenia gravis panel and thyroid  testing negative.  MRI showed a sella mass which may be a potential cause.      MRI of pituitary with and without contrast Further recommendations pending results.   Subjective:  Chad Lawrence is a 74 year old right-handed male with paroxysmal atrial fibrillation, HLD, ascending aortic aneurysm repair, and aortic regurgitation s/p AVR who follows up for recurrent transient diplopia.  MRI personally reviewed.  UPDATE: Last seen in December 2024.  Labs from 11/05/2023 revealed TSH 0.87, negative AChR binding Abs and negative MuSK antibody.  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  Patient declined follow up MRI of the pituitary and he was lost to follow up.  Since then, they have been overall stable.  Hhe continues to have intermittent blurred vision, occurring 30 seconds about twice a a week.  On 12/23, he was walking in the store when it occurred, but felt dizzy and like he was going to fall to the right as well.  It lasted a minute.  If he covers either eye, vision is clear.  He feels that his left eye doesn't move as fast with his right eye.  Not associated with headache.  Patient's PCP checked LRP4 antibodies which is still pending.    HISTORY: Beginning in late 2022/early 2023, he began experiencing recurrent episodes of horizontal double vision.  The first time, it occurred, he was driving home from a long trip and had to pull over.  If he closed his left eye, vision was clear.  If he closed his right eye, it took a moment to return  to focus.  It lasted about 10-15 minutes and resolved.  It happens when he is focusing on something rather than just at rest.  80% of the time it occurred while driving but it may also occur if he stands up to quickly, in which he may have associated dizziness.  Dizziness only occurs occasionally.  He had a very mild headache with the first episode, otherwise no associated headache.  No other focal neurologic deficits.  They typically last a few minutes.  His last episode occurred a couple of weeks ago and lasted just a few seconds.  He has had about 3 to 5 spells over the past 2 years.  When this first occurred, he saw his ophthalmologist who found nothing abnormal on exam, although he wasn't experiencing an episode at the time.  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  His atrial fibrillation has been controlled.   EKG on 06/20/2023 showed NSR of 55 bpm with left anterior fascicular block, incomplete RBB and nonspecific T abnormality.  2D echo on 07/08/2023 revealed LVEF 65% and s/p [employer] pericardial tissue valve without valvular or paravalvular regurgitation.    Denies history of migraines or headaches.  Of note, recently he notices when he stands in the shower washing his hair  with his eyes closed, he feels off balance.  No dizziness.  Denies numbness and tingling in the feet/toes.  No falls.  PAST MEDICAL HISTORY: Past Medical History:  Diagnosis Date   Aortic regurgitation    Arrhythmia    Ascending aorta dilatation    Atrial fibrillation (HCC)    Hyperlipidemia    Pneumonia     MEDICATIONS: Medications Ordered Prior to Encounter[1]  ALLERGIES: Allergies[2]  FAMILY HISTORY: Family History  Problem Relation Age of Onset    Hypertension Mother    Breast cancer Mother    Atrial fibrillation Mother    Emphysema Father    Aneurysm Father        abdominal aortic aneurysm   COPD Father       Objective:  Blood pressure (!) 160/66, pulse 77, height 6' (1.829 m), weight 190 lb 6.4 oz (86.4 kg), SpO2 99%. General: No acute distress.  Patient appears well-groomed.   Head:  Normocephalic/atraumatic Eyes:  Fundi examined but not visualized Neck: supple, no paraspinal tenderness, full range of motion Heart:  Regular rate and rhythm Neurological Exam: alert and oriented.  Speech fluent and not dysarthric, language intact.  CN II-XII intact. Bulk and tone normal, muscle strength 5/5 throughout.  Sensation to light touch intact.  Deep tendon reflexes 2+ throughout, toes downgoing.  Finger to nose testing intact.  Gait normal, Romberg negative.   Juliene Dunnings, DO  CC: Chad Gell, MD          [1]  Current Outpatient Medications on File Prior to Visit  Medication Sig Dispense Refill   amiodarone  (PACERONE ) 200 MG tablet Take 0.5 tablets (100 mg total) by mouth as directed. 5 days a week M -F. None Sat-Sun 45 tablet 0   apixaban  (ELIQUIS ) 5 MG TABS tablet TAKE 1 TABLET TWICE A DAY 180 tablet 1   rosuvastatin  (CRESTOR ) 10 MG tablet TAKE 1 TABLET DAILY 90 tablet 0   No current facility-administered medications on file prior to visit.  [2] Not on File  "

## 2024-11-29 ENCOUNTER — Encounter: Payer: Self-pay | Admitting: Neurology

## 2024-11-29 ENCOUNTER — Ambulatory Visit: Payer: Self-pay | Admitting: Neurology

## 2024-11-29 VITALS — BP 138/82 | HR 77 | Ht 72.0 in | Wt 190.4 lb

## 2024-11-29 DIAGNOSIS — R930 Abnormal findings on diagnostic imaging of skull and head, not elsewhere classified: Secondary | ICD-10-CM | POA: Diagnosis not present

## 2024-11-29 DIAGNOSIS — H532 Diplopia: Secondary | ICD-10-CM

## 2024-11-29 NOTE — Patient Instructions (Addendum)
 Will check MRI of the pituitary gland with and without contrast to focus on finding seen on brain MRI, which potentially may be a cause for symptoms.  Further recommendations pending results.  We have sent a referral to [facility] for your MRI and they will call you directly to schedule your appointment. They are located at [address] [facility]. If you need to contact them directly please call [phone].

## 2024-12-29 ENCOUNTER — Other Ambulatory Visit
